# Patient Record
Sex: Female | Born: 1975
Health system: Southern US, Community
[De-identification: ages and names within clinical notes are randomized; demographics above are authoritative.]

## PROBLEM LIST (undated history)

## (undated) ENCOUNTER — Inpatient Hospital Stay (HOSPITAL_COMMUNITY): Payer: Medicaid Other

## (undated) DIAGNOSIS — O34219 Maternal care for unspecified type scar from previous cesarean delivery: Secondary | ICD-10-CM

## (undated) DIAGNOSIS — O163 Unspecified maternal hypertension, third trimester: Secondary | ICD-10-CM

## (undated) DIAGNOSIS — O139 Gestational [pregnancy-induced] hypertension without significant proteinuria, unspecified trimester: Secondary | ICD-10-CM

## (undated) DIAGNOSIS — E669 Obesity, unspecified: Secondary | ICD-10-CM

## (undated) DIAGNOSIS — O24419 Gestational diabetes mellitus in pregnancy, unspecified control: Secondary | ICD-10-CM

## (undated) DIAGNOSIS — E119 Type 2 diabetes mellitus without complications: Secondary | ICD-10-CM

## (undated) HISTORY — DX: Gestational diabetes mellitus in pregnancy, unspecified control: O24.419

## (undated) HISTORY — DX: Type 2 diabetes mellitus without complications: E11.9

---

## 2008-07-17 ENCOUNTER — Inpatient Hospital Stay (HOSPITAL_COMMUNITY): Admission: AD | Admit: 2008-07-17 | Discharge: 2008-07-17 | Payer: Self-pay | Admitting: Obstetrics & Gynecology

## 2008-08-01 ENCOUNTER — Encounter: Payer: Self-pay | Admitting: Family Medicine

## 2008-08-01 ENCOUNTER — Other Ambulatory Visit: Admission: RE | Admit: 2008-08-01 | Discharge: 2008-08-01 | Payer: Self-pay | Admitting: Family Medicine

## 2008-08-01 ENCOUNTER — Ambulatory Visit: Payer: Self-pay | Admitting: Family Medicine

## 2008-08-01 LAB — CONVERTED CEMR LAB
Antibody Screen: NEGATIVE
Basophils Absolute: 0 10*3/uL (ref 0.0–0.1)
Basophils Relative: 0 % (ref 0–1)
Eosinophils Relative: 1 % (ref 0–5)
GC Probe Amp, Genital: NEGATIVE
HCT: 39.7 % (ref 36.0–46.0)
Hemoglobin: 13.1 g/dL (ref 12.0–15.0)
MCHC: 33 g/dL (ref 30.0–36.0)
MCV: 86.5 fL (ref 78.0–100.0)
Monocytes Absolute: 0.8 10*3/uL (ref 0.1–1.0)
RBC: 4.59 M/uL (ref 3.87–5.11)
RDW: 14.5 % (ref 11.5–15.5)
Rh Type: POSITIVE
Sickle Cell Screen: NEGATIVE
WBC: 11.3 10*3/uL — ABNORMAL HIGH (ref 4.0–10.5)

## 2008-08-02 ENCOUNTER — Encounter: Payer: Self-pay | Admitting: Family Medicine

## 2008-08-05 ENCOUNTER — Telehealth: Payer: Self-pay | Admitting: Family Medicine

## 2008-08-07 ENCOUNTER — Encounter: Payer: Self-pay | Admitting: Family Medicine

## 2008-08-08 ENCOUNTER — Encounter: Payer: Self-pay | Admitting: Family Medicine

## 2008-08-08 ENCOUNTER — Telehealth: Payer: Self-pay | Admitting: Family Medicine

## 2008-08-12 ENCOUNTER — Encounter (INDEPENDENT_AMBULATORY_CARE_PROVIDER_SITE_OTHER): Payer: Self-pay | Admitting: *Deleted

## 2008-08-28 ENCOUNTER — Ambulatory Visit: Payer: Self-pay | Admitting: Family Medicine

## 2008-09-02 ENCOUNTER — Encounter: Payer: Self-pay | Admitting: Family Medicine

## 2008-09-04 ENCOUNTER — Ambulatory Visit: Payer: Self-pay | Admitting: Family Medicine

## 2008-09-12 ENCOUNTER — Encounter: Payer: Self-pay | Admitting: Family Medicine

## 2008-09-12 ENCOUNTER — Ambulatory Visit (HOSPITAL_COMMUNITY): Admission: RE | Admit: 2008-09-12 | Discharge: 2008-09-12 | Payer: Self-pay | Admitting: Family Medicine

## 2008-09-16 ENCOUNTER — Telehealth (INDEPENDENT_AMBULATORY_CARE_PROVIDER_SITE_OTHER): Payer: Self-pay | Admitting: *Deleted

## 2008-09-16 ENCOUNTER — Encounter: Payer: Self-pay | Admitting: Family Medicine

## 2008-09-26 ENCOUNTER — Ambulatory Visit: Payer: Self-pay | Admitting: Family Medicine

## 2008-09-26 ENCOUNTER — Encounter: Payer: Self-pay | Admitting: Family Medicine

## 2008-09-26 ENCOUNTER — Ambulatory Visit (HOSPITAL_COMMUNITY): Admission: RE | Admit: 2008-09-26 | Discharge: 2008-09-26 | Payer: Self-pay | Admitting: Family Medicine

## 2008-09-26 LAB — CONVERTED CEMR LAB
Bilirubin Urine: NEGATIVE
Epithelial cells, urine: >20 /lpf
Glucose, Urine, Semiquant: NEGATIVE
Protein, U semiquant: 30
WBC, UA: >20 cells/hpf
pH: 6.5

## 2008-09-27 ENCOUNTER — Encounter: Payer: Self-pay | Admitting: Family Medicine

## 2008-09-30 ENCOUNTER — Encounter: Payer: Self-pay | Admitting: Family Medicine

## 2008-10-01 ENCOUNTER — Ambulatory Visit: Payer: Self-pay | Admitting: Family Medicine

## 2008-10-17 ENCOUNTER — Ambulatory Visit: Payer: Self-pay | Admitting: Family Medicine

## 2008-10-17 ENCOUNTER — Encounter: Payer: Self-pay | Admitting: Family Medicine

## 2008-10-17 LAB — CONVERTED CEMR LAB
Ketones, urine, test strip: NEGATIVE
Nitrite: NEGATIVE
Urobilinogen, UA: 0.2
pH: 7

## 2008-11-01 ENCOUNTER — Ambulatory Visit: Payer: Self-pay | Admitting: Family Medicine

## 2008-11-15 ENCOUNTER — Ambulatory Visit: Payer: Self-pay | Admitting: Family Medicine

## 2008-11-15 ENCOUNTER — Encounter: Payer: Self-pay | Admitting: Family Medicine

## 2008-11-15 DIAGNOSIS — R7309 Other abnormal glucose: Secondary | ICD-10-CM | POA: Insufficient documentation

## 2008-11-15 LAB — CONVERTED CEMR LAB
MCV: 89.3 fL (ref 78.0–100.0)
Platelets: 146 10*3/uL — ABNORMAL LOW (ref 150–400)
RDW: 14.5 % (ref 11.5–15.5)
WBC: 10.1 10*3/uL (ref 4.0–10.5)

## 2008-11-19 ENCOUNTER — Encounter: Payer: Self-pay | Admitting: *Deleted

## 2008-11-25 ENCOUNTER — Ambulatory Visit: Payer: Self-pay | Admitting: Obstetrics & Gynecology

## 2008-11-25 ENCOUNTER — Encounter: Payer: Self-pay | Admitting: Family Medicine

## 2008-11-25 ENCOUNTER — Encounter: Admission: RE | Admit: 2008-11-25 | Discharge: 2008-12-02 | Payer: Self-pay | Admitting: Obstetrics & Gynecology

## 2008-11-25 LAB — CONVERTED CEMR LAB
HCT: 38.9 % (ref 36.0–46.0)
Platelets: 142 10*3/uL — ABNORMAL LOW (ref 150–400)
RDW: 14.1 % (ref 11.5–15.5)
WBC: 9.3 10*3/uL (ref 4.0–10.5)

## 2008-12-02 ENCOUNTER — Ambulatory Visit: Payer: Self-pay | Admitting: Obstetrics & Gynecology

## 2008-12-02 ENCOUNTER — Ambulatory Visit (HOSPITAL_COMMUNITY): Admission: RE | Admit: 2008-12-02 | Discharge: 2008-12-02 | Payer: Self-pay | Admitting: Family Medicine

## 2008-12-04 ENCOUNTER — Telehealth: Payer: Self-pay | Admitting: Family Medicine

## 2008-12-09 ENCOUNTER — Ambulatory Visit: Payer: Self-pay | Admitting: Obstetrics & Gynecology

## 2008-12-10 ENCOUNTER — Ambulatory Visit (HOSPITAL_COMMUNITY): Admission: RE | Admit: 2008-12-10 | Discharge: 2008-12-10 | Payer: Self-pay | Admitting: Family Medicine

## 2008-12-12 ENCOUNTER — Ambulatory Visit: Payer: Self-pay | Admitting: Obstetrics and Gynecology

## 2008-12-17 ENCOUNTER — Ambulatory Visit: Payer: Self-pay | Admitting: Obstetrics & Gynecology

## 2008-12-19 ENCOUNTER — Ambulatory Visit (HOSPITAL_COMMUNITY): Admission: RE | Admit: 2008-12-19 | Discharge: 2008-12-19 | Payer: Self-pay | Admitting: Obstetrics and Gynecology

## 2008-12-19 ENCOUNTER — Ambulatory Visit: Payer: Self-pay | Admitting: Obstetrics & Gynecology

## 2008-12-23 ENCOUNTER — Ambulatory Visit: Payer: Self-pay | Admitting: Obstetrics & Gynecology

## 2008-12-26 ENCOUNTER — Ambulatory Visit: Payer: Self-pay | Admitting: Family Medicine

## 2008-12-30 ENCOUNTER — Ambulatory Visit: Payer: Self-pay | Admitting: Obstetrics & Gynecology

## 2009-01-02 ENCOUNTER — Ambulatory Visit: Payer: Self-pay | Admitting: Obstetrics & Gynecology

## 2009-01-03 ENCOUNTER — Telehealth: Payer: Self-pay | Admitting: Family Medicine

## 2009-01-06 ENCOUNTER — Ambulatory Visit: Payer: Self-pay | Admitting: Obstetrics & Gynecology

## 2009-01-09 ENCOUNTER — Ambulatory Visit: Payer: Self-pay | Admitting: Obstetrics & Gynecology

## 2009-01-13 ENCOUNTER — Ambulatory Visit (HOSPITAL_COMMUNITY): Admission: RE | Admit: 2009-01-13 | Discharge: 2009-01-13 | Payer: Self-pay | Admitting: Family Medicine

## 2009-01-13 ENCOUNTER — Telehealth: Payer: Self-pay | Admitting: Family Medicine

## 2009-01-13 ENCOUNTER — Ambulatory Visit: Payer: Self-pay | Admitting: Obstetrics & Gynecology

## 2009-01-13 LAB — CONVERTED CEMR LAB: Chlamydia, Swab/Urine, PCR: NEGATIVE

## 2009-01-16 ENCOUNTER — Ambulatory Visit: Payer: Self-pay | Admitting: Obstetrics & Gynecology

## 2009-01-20 ENCOUNTER — Ambulatory Visit: Payer: Self-pay | Admitting: Family Medicine

## 2009-01-23 ENCOUNTER — Ambulatory Visit: Payer: Self-pay | Admitting: Obstetrics & Gynecology

## 2009-01-27 ENCOUNTER — Ambulatory Visit (HOSPITAL_COMMUNITY): Admission: RE | Admit: 2009-01-27 | Discharge: 2009-01-27 | Payer: Self-pay | Admitting: Family Medicine

## 2009-01-27 ENCOUNTER — Ambulatory Visit: Payer: Self-pay | Admitting: Obstetrics & Gynecology

## 2009-01-30 ENCOUNTER — Ambulatory Visit: Payer: Self-pay | Admitting: Obstetrics & Gynecology

## 2009-01-31 ENCOUNTER — Ambulatory Visit: Payer: Self-pay | Admitting: Obstetrics & Gynecology

## 2009-02-01 ENCOUNTER — Ambulatory Visit: Payer: Self-pay | Admitting: Family Medicine

## 2009-02-01 ENCOUNTER — Inpatient Hospital Stay (HOSPITAL_COMMUNITY): Admission: AD | Admit: 2009-02-01 | Discharge: 2009-02-03 | Payer: Self-pay | Admitting: Obstetrics & Gynecology

## 2009-02-03 ENCOUNTER — Telehealth: Payer: Self-pay | Admitting: Family Medicine

## 2009-03-10 ENCOUNTER — Ambulatory Visit: Payer: Self-pay | Admitting: Family Medicine

## 2009-03-10 ENCOUNTER — Encounter: Payer: Self-pay | Admitting: *Deleted

## 2009-03-10 DIAGNOSIS — O24919 Unspecified diabetes mellitus in pregnancy, unspecified trimester: Secondary | ICD-10-CM | POA: Insufficient documentation

## 2009-04-01 ENCOUNTER — Encounter: Payer: Self-pay | Admitting: Family Medicine

## 2009-05-09 ENCOUNTER — Emergency Department (HOSPITAL_COMMUNITY): Admission: EM | Admit: 2009-05-09 | Discharge: 2009-05-09 | Payer: Self-pay | Admitting: Emergency Medicine

## 2010-05-03 ENCOUNTER — Encounter: Payer: Self-pay | Admitting: Family Medicine

## 2010-06-28 LAB — POCT I-STAT, CHEM 8
Chloride: 103 mEq/L (ref 96–112)
Glucose, Bld: 219 mg/dL — ABNORMAL HIGH (ref 70–99)
Hemoglobin: 13.9 g/dL (ref 12.0–15.0)

## 2010-06-28 LAB — URINALYSIS, ROUTINE W REFLEX MICROSCOPIC
Ketones, ur: 15 mg/dL — AB
Nitrite: NEGATIVE
Protein, ur: 100 mg/dL — AB
Urobilinogen, UA: 1 mg/dL (ref 0.0–1.0)

## 2010-06-28 LAB — URINE CULTURE

## 2010-06-28 LAB — DIFFERENTIAL
Basophils Absolute: 0 10*3/uL (ref 0.0–0.1)
Basophils Relative: 0 % (ref 0–1)
Eosinophils Absolute: 0 10*3/uL (ref 0.0–0.7)
Monocytes Absolute: 1.2 10*3/uL — ABNORMAL HIGH (ref 0.1–1.0)
Neutro Abs: 7.8 10*3/uL — ABNORMAL HIGH (ref 1.7–7.7)

## 2010-06-28 LAB — URINE MICROSCOPIC-ADD ON

## 2010-06-28 LAB — CBC
HCT: 38.4 % (ref 36.0–46.0)
Platelets: 165 10*3/uL (ref 150–400)
RBC: 4.37 MIL/uL (ref 3.87–5.11)

## 2010-07-15 LAB — GLUCOSE, CAPILLARY: Glucose-Capillary: 198 mg/dL — ABNORMAL HIGH (ref 70–99)

## 2010-07-16 LAB — GLUCOSE, CAPILLARY
Glucose-Capillary: 78 mg/dL (ref 70–99)
Glucose-Capillary: 82 mg/dL (ref 70–99)
Glucose-Capillary: 89 mg/dL (ref 70–99)

## 2010-07-16 LAB — POCT URINALYSIS DIP (DEVICE)
Bilirubin Urine: NEGATIVE
Bilirubin Urine: NEGATIVE
Glucose, UA: NEGATIVE mg/dL
Glucose, UA: NEGATIVE mg/dL
Hgb urine dipstick: NEGATIVE
Ketones, ur: NEGATIVE mg/dL
Nitrite: NEGATIVE
Protein, ur: NEGATIVE mg/dL
Specific Gravity, Urine: 1.01 (ref 1.005–1.030)
Specific Gravity, Urine: 1.01 (ref 1.005–1.030)
Specific Gravity, Urine: 1.015 (ref 1.005–1.030)
Urobilinogen, UA: 0.2 mg/dL (ref 0.0–1.0)
pH: 6 (ref 5.0–8.0)

## 2010-07-16 LAB — CBC
HCT: 40.4 % (ref 36.0–46.0)
Hemoglobin: 11.3 g/dL — ABNORMAL LOW (ref 12.0–15.0)
MCHC: 33.7 g/dL (ref 30.0–36.0)
MCHC: 33.8 g/dL (ref 30.0–36.0)
MCV: 88.9 fL (ref 78.0–100.0)
MCV: 89.1 fL (ref 78.0–100.0)
MCV: 89.2 fL (ref 78.0–100.0)
Platelets: 107 10*3/uL — ABNORMAL LOW (ref 150–400)
Platelets: 108 10*3/uL — ABNORMAL LOW (ref 150–400)
RBC: 3.74 MIL/uL — ABNORMAL LOW (ref 3.87–5.11)
RBC: 4.54 MIL/uL (ref 3.87–5.11)
RDW: 15.3 % (ref 11.5–15.5)
RDW: 15.5 % (ref 11.5–15.5)
WBC: 8.3 10*3/uL (ref 4.0–10.5)
WBC: 9.6 10*3/uL (ref 4.0–10.5)

## 2010-07-16 LAB — COMPREHENSIVE METABOLIC PANEL
AST: 19 U/L (ref 0–37)
Albumin: 3.1 g/dL — ABNORMAL LOW (ref 3.5–5.2)
Alkaline Phosphatase: 235 U/L — ABNORMAL HIGH (ref 39–117)
BUN: 8 mg/dL (ref 6–23)
CO2: 19 mEq/L (ref 19–32)
Chloride: 106 mEq/L (ref 96–112)
Creatinine, Ser: 0.6 mg/dL (ref 0.4–1.2)
GFR calc non Af Amer: >60 mL/min (ref >60)
Potassium: 3.7 mEq/L (ref 3.5–5.1)
Total Bilirubin: 0.7 mg/dL (ref 0.3–1.2)

## 2010-07-16 LAB — RPR: RPR Ser Ql: NONREACTIVE

## 2010-07-17 LAB — POCT URINALYSIS DIP (DEVICE)
Bilirubin Urine: NEGATIVE
Bilirubin Urine: NEGATIVE
Bilirubin Urine: NEGATIVE
Bilirubin Urine: NEGATIVE
Bilirubin Urine: NEGATIVE
Glucose, UA: NEGATIVE mg/dL
Glucose, UA: NEGATIVE mg/dL
Glucose, UA: NEGATIVE mg/dL
Glucose, UA: NEGATIVE mg/dL
Hgb urine dipstick: NEGATIVE
Hgb urine dipstick: NEGATIVE
Hgb urine dipstick: NEGATIVE
Hgb urine dipstick: NEGATIVE
Ketones, ur: 40 mg/dL — AB
Ketones, ur: NEGATIVE mg/dL
Ketones, ur: 15 mg/dL — AB
Ketones, ur: 40 mg/dL — AB
Ketones, ur: NEGATIVE mg/dL
Nitrite: NEGATIVE
Nitrite: NEGATIVE
Nitrite: NEGATIVE
Nitrite: NEGATIVE
Nitrite: NEGATIVE
Protein, ur: NEGATIVE mg/dL
Protein, ur: NEGATIVE mg/dL
Protein, ur: NEGATIVE mg/dL
Protein, ur: NEGATIVE mg/dL
Protein, ur: NEGATIVE mg/dL
Specific Gravity, Urine: 1.01 (ref 1.005–1.030)
Specific Gravity, Urine: 1.01 (ref 1.005–1.030)
Specific Gravity, Urine: 1.01 (ref 1.005–1.030)
Specific Gravity, Urine: 1.015 (ref 1.005–1.030)
Urobilinogen, UA: 0.2 mg/dL (ref 0.0–1.0)
Urobilinogen, UA: 0.2 mg/dL (ref 0.0–1.0)
Urobilinogen, UA: 0.2 mg/dL (ref 0.0–1.0)
Urobilinogen, UA: 0.2 mg/dL (ref 0.0–1.0)
pH: 6 (ref 5.0–8.0)
pH: 6.5 (ref 5.0–8.0)
pH: 6.5 (ref 5.0–8.0)
pH: 6.5 (ref 5.0–8.0)
pH: 7 (ref 5.0–8.0)

## 2010-07-18 LAB — POCT URINALYSIS DIP (DEVICE)
Bilirubin Urine: NEGATIVE
Bilirubin Urine: NEGATIVE
Bilirubin Urine: NEGATIVE
Glucose, UA: NEGATIVE mg/dL
Glucose, UA: NEGATIVE mg/dL
Glucose, UA: NEGATIVE mg/dL
Hgb urine dipstick: NEGATIVE
Ketones, ur: 80 mg/dL — AB
Ketones, ur: 80 mg/dL — AB
Nitrite: NEGATIVE
Protein, ur: NEGATIVE mg/dL
Specific Gravity, Urine: 1.01 (ref 1.005–1.030)
Specific Gravity, Urine: <=1.005 (ref 1.005–1.030)
Urobilinogen, UA: 0.2 mg/dL (ref 0.0–1.0)

## 2010-07-18 LAB — GLUCOSE, CAPILLARY
Glucose-Capillary: 333 mg/dL — ABNORMAL HIGH (ref 70–99)
Glucose-Capillary: 370 mg/dL — ABNORMAL HIGH (ref 70–99)

## 2010-07-22 LAB — WET PREP, GENITAL
Clue Cells Wet Prep HPF POC: NONE SEEN
Trich, Wet Prep: NONE SEEN

## 2010-07-22 LAB — ABO/RH: ABO/RH(D): O POS

## 2010-12-15 IMAGING — CR DG CHEST 2V
2 series · 2 of 2 positions shown · non-contrast
Comparison: None

CLINICAL DATA: Fever

CHEST - 2 VIEW

[w chest pa]
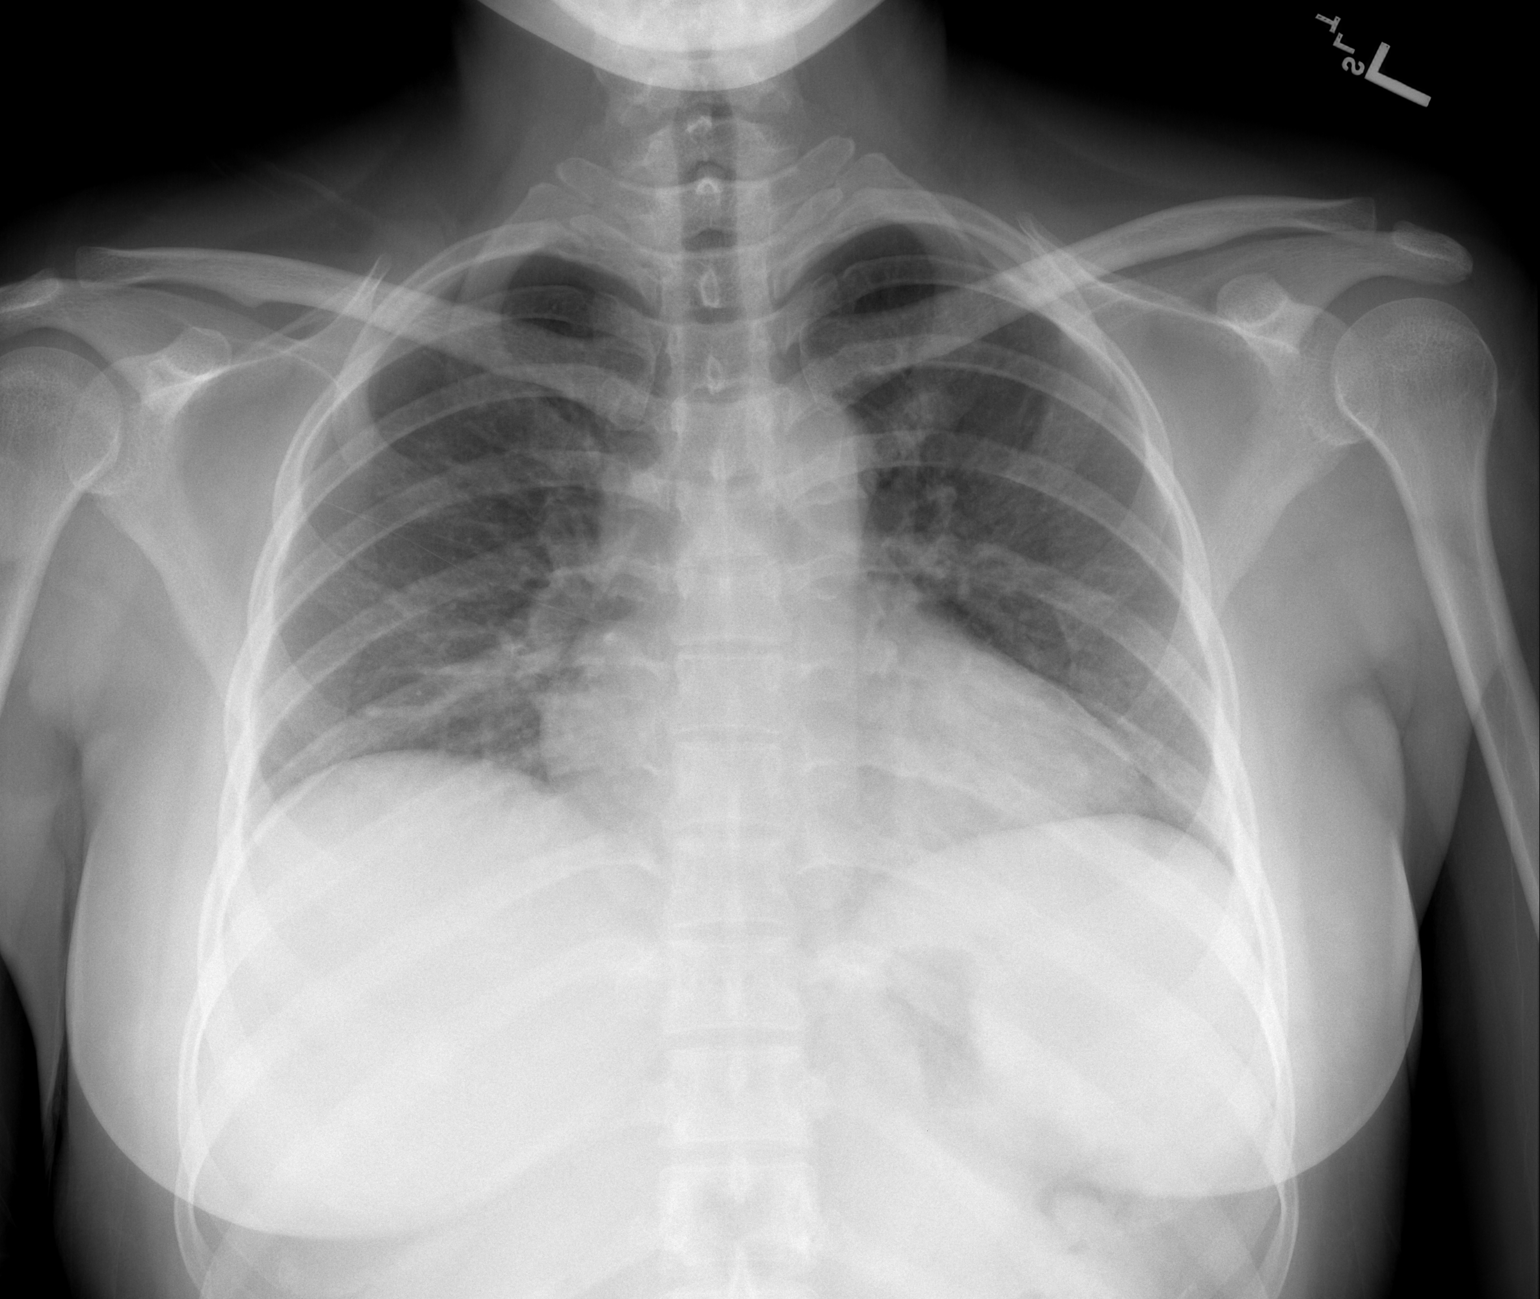

[w chest lat]
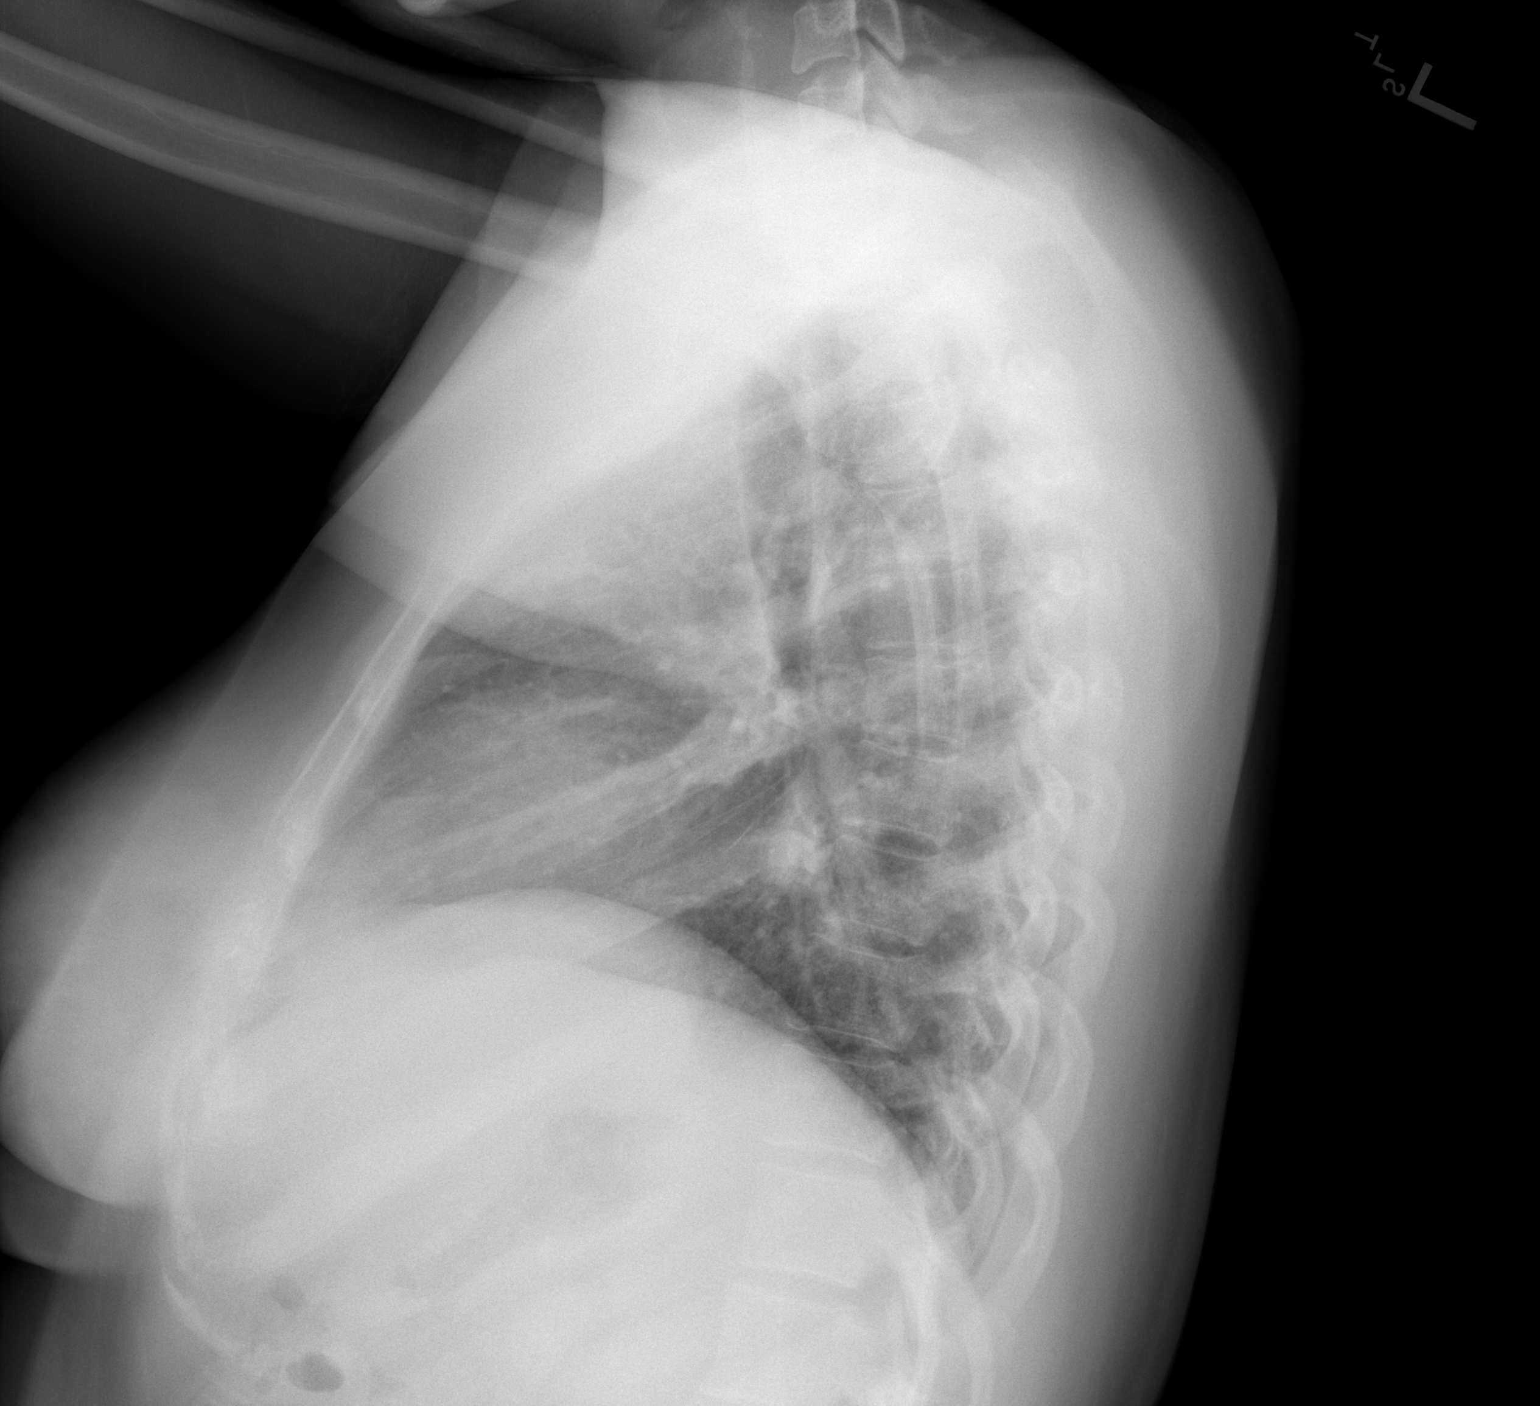

[2 of 2 positions shown; findings below may reference images not displayed]

FINDINGS: Mild cardiac enlargement, likely accentuated by low lung volumes.
Normal mediastinal contours and pulmonary vascularity.
Bibasilar atelectasis.
No gross infiltrate or effusion.
Bones unremarkable.
No pneumothorax.
IMPRESSION: Bibasilar atelectasis.

## 2011-07-13 ENCOUNTER — Ambulatory Visit: Payer: Self-pay | Admitting: Physician Assistant

## 2011-07-13 VITALS — BP 154/98 | HR 94 | Temp 98.1°F | Resp 16 | Ht 61.0 in | Wt 148.0 lb

## 2011-07-13 DIAGNOSIS — R030 Elevated blood-pressure reading, without diagnosis of hypertension: Secondary | ICD-10-CM

## 2011-07-13 DIAGNOSIS — E119 Type 2 diabetes mellitus without complications: Secondary | ICD-10-CM

## 2011-07-13 DIAGNOSIS — E1129 Type 2 diabetes mellitus with other diabetic kidney complication: Secondary | ICD-10-CM | POA: Insufficient documentation

## 2011-07-13 DIAGNOSIS — R059 Cough, unspecified: Secondary | ICD-10-CM

## 2011-07-13 DIAGNOSIS — R05 Cough: Secondary | ICD-10-CM

## 2011-07-13 DIAGNOSIS — R21 Rash and other nonspecific skin eruption: Secondary | ICD-10-CM

## 2011-07-13 DIAGNOSIS — IMO0001 Reserved for inherently not codable concepts without codable children: Secondary | ICD-10-CM

## 2011-07-13 HISTORY — DX: Type 2 diabetes mellitus without complications: E11.9

## 2011-07-13 LAB — BASIC METABOLIC PANEL
CO2: 24 mEq/L (ref 19–32)
Calcium: 9.5 mg/dL (ref 8.4–10.5)
Potassium: 3.9 mEq/L (ref 3.5–5.3)
Sodium: 139 mEq/L (ref 135–145)

## 2011-07-13 LAB — POCT GLYCOSYLATED HEMOGLOBIN (HGB A1C): Hemoglobin A1C: 7.9

## 2011-07-13 LAB — POCT SKIN KOH: Skin KOH, POC: NEGATIVE

## 2011-07-13 MED ORDER — METFORMIN HCL 1000 MG PO TABS: 1000.0000 mg | ORAL_TABLET | Freq: Two times a day (BID) | ORAL | Status: DC

## 2011-07-13 MED ORDER — CETIRIZINE HCL 10 MG PO TABS: 10.0000 mg | ORAL_TABLET | Freq: Every day | ORAL | Status: DC

## 2011-07-13 MED ORDER — BENZONATATE 100 MG PO CAPS: ORAL_CAPSULE | ORAL | Status: AC

## 2011-07-13 MED ORDER — TRIAMCINOLONE ACETONIDE 0.5 % EX OINT: TOPICAL_OINTMENT | Freq: Two times a day (BID) | CUTANEOUS | Status: AC

## 2011-07-13 NOTE — Progress Notes (Signed)
  Subjective:    Patient ID: [name], female    DOB: 05/13/1975, 36 y.o.   MRN: [id number]  HPI Pt presents to clinic with 3 complaints 1- needs DM recheck.  Does not check her sugars.  Takes her meds every day.  Feels good.  Trying to become pregnant again.  No h/o BP problems.  No problems with her feet. 2- has had a cough for the last couple of weeks with a tickle in the back of her throat. 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  The rash has been there are a while - she has used some OTC meds (unsure of name)   Review of Systems  Constitutional: Negative for fever and chills.  HENT: Positive for congestion and postnasal drip. Negative for rhinorrhea.   Respiratory: Positive for cough (dry tickle like cough).   Gastrointestinal: Negative for nausea, vomiting and diarrhea.  Skin: Positive for rash.       Objective:   Physical Exam  Constitutional: She is oriented to person, place, and time. She appears well-developed and well-nourished.  HENT:  Head: Normocephalic and atraumatic.  Right Ear: Hearing, tympanic membrane, external ear and ear canal normal.  Left Ear: Hearing, tympanic membrane, external ear and ear canal normal.  Nose: Mucosal edema (pale) present.  Mouth/Throat: Uvula is midline, oropharynx is clear and moist and mucous membranes are normal.  Eyes: Conjunctivae are normal.  Neck: Neck supple.  Cardiovascular: Normal rate, regular rhythm and normal heart sounds.   Pulmonary/Chest: Effort normal and breath sounds normal.  Lymphadenopathy:    She has no cervical adenopathy.  Neurological: She is alert and oriented to person, place, and time.  Skin: Skin is warm and dry. Rash noted.     Psychiatric: She has a normal mood and affect. Her behavior is normal. Judgment and thought content normal.          Assessment & Plan:   1. DM type 2 (diabetes mellitus, type 2)  POCT glycosylated  hemoglobin (Hb A1C), Basic metabolic panel, metFORMIN (GLUCOPHAGE) 1000 MG tablet  2. Rash  POCT Skin KOH, triamcinolone ointment (KENALOG) 0.5 %  3. Cough  benzonatate (TESSALON) 100 MG capsule, cetirizine (ZYRTEC) 10 MG tablet   Pt to monitor her BP - but I do not want to do any meds because of her interest in becoming pregnant.  D/w pt that her HgbA1C is slightly higher than we would like but once again due to interest in becoming pregnant will not start any other medications and stressed the importance of good healthy lifestyle.  Pt to start to more carefully monitor her glucose esp with her wish to become pregnant.  Pt to be aware that latex gloves can make her skin rash worse - but it is important to continue to wear gloves to decrease risk of other infections.

## 2011-10-08 ENCOUNTER — Other Ambulatory Visit: Payer: Self-pay | Admitting: Physician Assistant

## 2011-11-16 ENCOUNTER — Ambulatory Visit (INDEPENDENT_AMBULATORY_CARE_PROVIDER_SITE_OTHER): Payer: Self-pay | Admitting: Physician Assistant

## 2011-11-16 VITALS — BP 120/78 | HR 87 | Temp 98.2°F | Resp 16 | Ht 61.25 in | Wt 147.4 lb

## 2011-11-16 DIAGNOSIS — E119 Type 2 diabetes mellitus without complications: Secondary | ICD-10-CM

## 2011-11-16 LAB — POCT GLYCOSYLATED HEMOGLOBIN (HGB A1C): Hemoglobin A1C: 9.1

## 2011-11-16 MED ORDER — METFORMIN HCL 1000 MG PO TABS: 1000.0000 mg | ORAL_TABLET | Freq: Two times a day (BID) | ORAL | Status: DC

## 2011-11-16 NOTE — Progress Notes (Signed)
  Subjective:    Patient ID: [name], female    DOB: 30-Aug-1975, 36 y.o.   MRN: [id number]  HPI 36 year old female presents for diabetes recheck.  Last HgA1c 3 months ago at 7.9. No changes at that time because she is trying to get pregnant.  Admits that she has not made any changes to her diet or exercised at all.  She is here with her husband who appears to be frustrated that she has not made any of these changes.      Review of Systems  All other systems reviewed and are negative.       Objective:   Physical Exam  Constitutional: She is oriented to person, place, and time. She appears well-developed and well-nourished.  HENT:  Head: Normocephalic and atraumatic.  Right Ear: External ear normal.  Left Ear: External ear normal.  Eyes: Conjunctivae are normal.  Neck: Normal range of motion.  Cardiovascular: Normal rate, regular rhythm and normal heart sounds.   Pulmonary/Chest: Effort normal and breath sounds normal.  Neurological: She is alert and oriented to person, place, and time.  Psychiatric: She has a normal mood and affect. Her behavior is normal. Judgment and thought content normal.          Assessment & Plan:   1. Diabetes mellitus  POCT glycosylated hemoglobin (Hb A1C)   Counseling and education provided.  Encouraged her to get 20-30 minutes of aerobic exercise daily.   Follow up in 3 months. If HgA1c still elevated, need to consider additional treatment.

## 2012-04-12 NOTE — L&D Delivery Note (Signed)
Infant delivered by Dr. Marice Potter (see separate operative note). Upon completion of cesarean section, perineum was inspected by me and patient was noted to have a 4 degree laceration. Using 2.0 Vicryl and multiple layer closure, the 4th degree laceration was repaired with excellent hemostasis and restoration of the anatomy. Rectal exam was performed. Fundus firm. Vault empty.

## 2012-09-05 ENCOUNTER — Ambulatory Visit: Payer: Self-pay | Admitting: Family Medicine

## 2012-09-05 VITALS — BP 138/84 | HR 103 | Temp 97.8°F | Resp 18 | Ht 61.0 in | Wt 146.0 lb

## 2012-09-05 DIAGNOSIS — N912 Amenorrhea, unspecified: Secondary | ICD-10-CM

## 2012-09-05 DIAGNOSIS — Z331 Pregnant state, incidental: Secondary | ICD-10-CM

## 2012-09-05 DIAGNOSIS — IMO0001 Reserved for inherently not codable concepts without codable children: Secondary | ICD-10-CM

## 2012-09-05 DIAGNOSIS — E119 Type 2 diabetes mellitus without complications: Secondary | ICD-10-CM

## 2012-09-05 DIAGNOSIS — Z3490 Encounter for supervision of normal pregnancy, unspecified, unspecified trimester: Secondary | ICD-10-CM

## 2012-09-05 LAB — GLUCOSE, POCT (MANUAL RESULT ENTRY): POC Glucose: 172 mg/dl — AB (ref 70–99)

## 2012-09-05 LAB — COMPREHENSIVE METABOLIC PANEL
ALT: 38 U/L — ABNORMAL HIGH (ref 0–35)
AST: 27 U/L (ref 0–37)
Calcium: 8.9 mg/dL (ref 8.4–10.5)
Chloride: 102 mEq/L (ref 96–112)
Creat: 0.55 mg/dL (ref 0.50–1.10)
Potassium: 3.9 mEq/L (ref 3.5–5.3)
Sodium: 135 mEq/L (ref 135–145)

## 2012-09-05 LAB — POCT GLYCOSYLATED HEMOGLOBIN (HGB A1C): Hemoglobin A1C: 9.5

## 2012-09-05 MED ORDER — GLYBURIDE 5 MG PO TABS: 5.0000 mg | ORAL_TABLET | Freq: Every day | ORAL | Status: DC

## 2012-09-05 MED ORDER — PRENATAL VITAMINS (DIS) PO TABS: 1.0000 | ORAL_TABLET | Freq: Every day | ORAL | Status: DC

## 2012-09-05 NOTE — Patient Instructions (Addendum)
Due Apr 26, 2013---6 weeks and 5 days.  Call [phone] - high risk clinic - tell them early pregnancy uncontrolled diabetes - on glyburide for control

## 2012-09-05 NOTE — Progress Notes (Signed)
   [address], [place] [address]   Phone [phone]  Subjective:    Patient ID: [name], female    DOB: 1975/12/07, 37 y.o.   MRN: [id number]  HPI  Pt presents to clinic with the need for a pregnancy test.  She took a home test and it was positive.  She has a 3.5 y/o son at home.  She feels really tired but other than that she is good.  She ran out of her diabetes medications about 3 days ago.  During her last pregnancy, she had gestational diabetes that ended up not resolving with delivery.  She has been on Glucophage since but never had good control.  She has not had good f/u for her diabetes.  Review of Systems  Constitutional: Positive for fatigue.  Gastrointestinal: Negative for nausea.  Genitourinary: Negative for vaginal bleeding.       Objective:   Physical Exam  Vitals reviewed. Constitutional: She is oriented to person, place, and time. She appears well-developed and well-nourished.  Cardiovascular: Normal rate, regular rhythm and normal heart sounds.   No murmur heard. Pulmonary/Chest: Effort normal and breath sounds normal.  Neurological: She is alert and oriented to person, place, and time.  Skin: Skin is warm and dry.  Psychiatric: She has a normal mood and affect. Her behavior is normal. Judgment and thought content normal.    Results for orders placed in visit on 09/05/12  POCT URINE PREGNANCY      Result Value Range   Preg Test, Ur Positive    GLUCOSE, POCT (MANUAL RESULT ENTRY)      Result Value Range   POC Glucose 172 (*) 70 - 99 mg/dl  POCT GLYCOSYLATED HEMOGLOBIN (HGB A1C)      Result Value Range   Hemoglobin A1C 9.5           Assessment & Plan:  Amenorrhea - Plan: POCT urine pregnancy  DM type 2 (diabetes mellitus, type 2) - Plan: POCT glucose (manual entry), POCT glycosylated hemoglobin (Hb A1C), Comprehensive metabolic panel, glyBURIDE (DIABETA) 5 MG tablet  Pregnancy - Plan: glyBURIDE (DIABETA) 5 MG tablet, Prenatal Vitamins (DIS)  TABS  Due to uncontrolled diabetes on metformin 1000 mg bid I suspect that pt will need insulin during the pregnancy.  We discussed the risk on uncontrolled glucose during pregnancy and she is going to work hard on her diet and get test strips to monitor her glucose.  I called and discussed the patient with [place] at [facility] and due to partially language barrier and multiple types of possible insulin we will start her on Glyburide and have her get an appt with the [facility] to  Look for a IUP and then have them start insulin.  She agrees with this plan.  If her glucose gets below 140 fasting she will contact me if she has not yet gotten an appt but I do not expect this to happen with a A1C of 9.5  D/w Dr Conley Rolls.  Benny Lennert PA-C 09/05/2012 3:07 PM

## 2012-09-07 ENCOUNTER — Ambulatory Visit: Payer: Self-pay | Admitting: Physician Assistant

## 2012-09-07 VITALS — BP 142/88 | HR 90 | Temp 98.5°F | Resp 16 | Ht 61.0 in | Wt 147.0 lb

## 2012-09-07 DIAGNOSIS — R748 Abnormal levels of other serum enzymes: Secondary | ICD-10-CM

## 2012-09-07 NOTE — Progress Notes (Signed)
   [address] [place] [address]   Phone [phone]  Subjective:    Patient ID: [name], female    DOB: 1975-04-19, 37 y.o.   MRN: [id number]  HPI Pt presents to clinic with concerns regarding her lab results.  She got a phone call about her liver enzymes and she was worried.  She is feeling good, she is tired.  She just got her DM meds.  Her glucose is running 150s in the am.    Review of Systems     Objective:   Physical Exam  Vitals reviewed. Constitutional: She is oriented to person, place, and time. She appears well-developed and well-nourished.  HENT:  Head: Normocephalic and atraumatic.  Right Ear: External ear normal.  Left Ear: External ear normal.  Eyes: Conjunctivae are normal.  Pulmonary/Chest: Effort normal.  Neurological: She is alert and oriented to person, place, and time.  Psychiatric: She has a normal mood and affect. Her behavior is normal. Judgment and thought content normal.          Assessment & Plan:  Elevated liver enzymes - d/w pt that her LFT were only mildly elevated and not something that we would get alarmed about but should be checked in the next 2-4 wks.  If she develops abd pain on the R due to pregnancy and increased in gall bladder disease she will RTC.  I answered her questions.  Benny Lennert PA-C 09/07/2012 4:55 PM

## 2012-10-02 ENCOUNTER — Other Ambulatory Visit: Payer: Self-pay | Admitting: Physician Assistant

## 2012-10-03 NOTE — Telephone Encounter (Signed)
Spoke w/pt to see whether she has been to high risk clinic at [facility] yet for them to manage her DM? Pt stated that she is waiting for her Medicaid card to come through before she can go. She would like RFs until she can see them at hosp. Maralyn Sago, is this OK to RF?

## 2012-10-03 NOTE — Telephone Encounter (Signed)
Sent 1 month supply to pharmacy, but she really needs to be seen at high risk clinic.

## 2012-10-05 NOTE — Telephone Encounter (Signed)
LMOM for pt that we have RFd med but provider strongly recommends that she get in to be seen at high risk clinic.

## 2012-11-07 ENCOUNTER — Other Ambulatory Visit: Payer: Self-pay | Admitting: Physician Assistant

## 2012-11-13 ENCOUNTER — Ambulatory Visit (INDEPENDENT_AMBULATORY_CARE_PROVIDER_SITE_OTHER): Payer: Medicaid Other | Admitting: Obstetrics & Gynecology

## 2012-11-13 ENCOUNTER — Other Ambulatory Visit: Payer: Self-pay

## 2012-11-13 ENCOUNTER — Encounter: Payer: Self-pay | Admitting: Obstetrics & Gynecology

## 2012-11-13 ENCOUNTER — Encounter: Payer: Medicaid Other | Attending: Obstetrics & Gynecology | Admitting: *Deleted

## 2012-11-13 ENCOUNTER — Other Ambulatory Visit (HOSPITAL_COMMUNITY)
Admission: RE | Admit: 2012-11-13 | Discharge: 2012-11-13 | Disposition: A | Discharge status: Home / Self Care | Payer: Medicaid Other | Source: Ambulatory Visit | Attending: Obstetrics & Gynecology | Admitting: Obstetrics & Gynecology

## 2012-11-13 VITALS — BP 132/84 | Wt 158.5 lb

## 2012-11-13 DIAGNOSIS — Z01419 Encounter for gynecological examination (general) (routine) without abnormal findings: Secondary | ICD-10-CM | POA: Insufficient documentation

## 2012-11-13 DIAGNOSIS — Z113 Encounter for screening for infections with a predominantly sexual mode of transmission: Secondary | ICD-10-CM | POA: Insufficient documentation

## 2012-11-13 DIAGNOSIS — O99810 Abnormal glucose complicating pregnancy: Secondary | ICD-10-CM

## 2012-11-13 DIAGNOSIS — O0992 Supervision of high risk pregnancy, unspecified, second trimester: Secondary | ICD-10-CM

## 2012-11-13 DIAGNOSIS — Z713 Dietary counseling and surveillance: Secondary | ICD-10-CM | POA: Insufficient documentation

## 2012-11-13 DIAGNOSIS — Z1151 Encounter for screening for human papillomavirus (HPV): Secondary | ICD-10-CM | POA: Insufficient documentation

## 2012-11-13 LAB — POCT URINALYSIS DIP (DEVICE)
Bilirubin Urine: NEGATIVE
Hgb urine dipstick: NEGATIVE
Nitrite: NEGATIVE
Protein, ur: NEGATIVE mg/dL
pH: 7 (ref 5.0–8.0)

## 2012-11-13 MED ORDER — ACCU-CHEK FASTCLIX LANCETS MISC: 1.0000 | Freq: Four times a day (QID) | Status: DC

## 2012-11-13 MED ORDER — GLUCOSE BLOOD VI STRP: ORAL_STRIP | Status: DC

## 2012-11-13 NOTE — Patient Instructions (Signed)
Type 1 or Type 2 Diabetes Mellitus During Pregnancy   Diabetes mellitus, often simply referred to as diabetes, is a long-term (chronic) disease. Type 1 diabetes occurs when the islet cells in the pancreas that make insulin (a hormone) are destroyed and can no longer make insulin. Type 2 diabetes occurs when the pancreas does not make enough insulin, the cells are less responsive to the insulin that is made (insulin resistance), or both. Insulin is needed to move sugars from food into the tissue cells. The tissue cells use the sugars for energy. The lack of insulin or the lack of normal response to insulin causes excess sugars to build up in the blood instead of going into the tissue cells. As a result, high blood sugar (hyperglycemia) develops.   If blood glucose levels are kept in the normal range both before and during pregnancy, women can have a healthy pregnancy. If your blood glucose levels are not well controlled, there may be risks to you, your unborn baby (fetus), your labor and delivery, or your newborn baby.   RISK FACTORS   · You are predisposed to developing type 1 diabetes if someone in your family has diabetes and you are exposed to certain environmental triggers.  · You have an increased chance of developing type 2 diabetes if you have a family history of diabetes and also have one or more of the following risk factors:  · Being overweight.  · Having an inactive lifestyle.  · Having a history of consistently eating high-calorie foods.   SYMPTOMS  The symptoms of diabetes include:  · Increased thirst (polydipsia).  · Increased urination (polyuria).  · Increased urination during the night (nocturia).  · Weight loss. This weight loss may be rapid.  · Frequent, recurring infections.  · Tiredness (fatigue).  · Weakness.  · Vision changes, such as blurred vision.  · Fruity smell to your breath.  · Abdominal pain.  · Nausea or vomiting.  DIAGNOSIS   Diabetes is diagnosed when blood glucose levels are  increased. Your blood glucose level may be checked by one or more of the following blood tests:  · A fasting blood glucose test.  You will not be allowed to eat for at least 8 hours before a blood sample is taken.  · A random blood glucose test. Your blood glucose is checked at any time of the day regardless of when you ate.  · A hemoglobin A1c blood glucose test. A hemoglobin A1c test provides information about blood glucose control over the previous 3 months.  · An oral glucose tolerance test (OGTT). Your blood glucose is measured after you have not eaten (fasted) for 1 3 hours and then after you drink a glucose-containing beverage. An OGTT is usually performed during weeks 24 28 of your pregnancy.  TREATMENT   · You will need to take diabetes medicine or insulin daily to keep blood glucose levels in the desired range.  · You will need to match insulin dosing with exercise and healthy food choices.   The treatment goal is to maintain the before-meal (preprandial), bedtime, and overnight blood glucose level at 60 99 mg/dL during pregnancy. The treatment goal is to further maintain the peak after-meal blood sugar (postprandial glucose) level at 100 140 mg/dL.    HOME CARE INSTRUCTIONS   · Have your hemoglobin A1c level checked twice a year.  · Perform daily blood glucose monitoring as directed by your caregiver. It is common to perform frequent blood glucose monitoring.    · Monitor urine ketones when   you are ill and as directed by your caregiver.  · Take your diabetes medicine and insulin as directed by your caregiver to maintain your blood glucose level in the desired range.  · Never run out of diabetes medicine or insulin. It is needed every day.  · Adjust insulin based on your intake of carbohydrates. Carbohydrates can raise blood glucose levels but need to be included in your diet. Carbohydrates provide vitamins, minerals, and fiber, which are an essential part of a healthy diet. Carbohydrates are found in  fruits, vegetables, whole grains, dairy products, legumes, and foods containing added sugars.  · Eat healthy foods. Alternate 3 meals with 3 snacks.  · Maintain a healthy weight gain. The usual total expected weight gain varies according to your prepregnancy body mass index (BMI).   · Carry a medical alert card or wear medical alert jewelry.  · Carry a 15 gram carbohydrate snack with you at all times to treat low blood sugar (hypoglycemia). Some examples of 15 gram carbohydrate snacks include:  · Glucose tablets, 3 or 4.  · Glucose gel, 15 gram tube.  · Raisins, 2 tablespoons (24 grams).  · Jelly beans, 6.  · Animal crackers, 8.  · Fruit juice, regular soda, or low fat milk, 4 ounces (120 mL).  · Gummy treats, 9.  · Recognize hypoglycemia. Hypoglycemia during pregnancy occurs with blood glucose levels of 60 mg/dL and below. The risk for hypoglycemia increases when fasting or skipping meals, during or after intense exercise, and during sleep. Hypoglycemia symptoms can include:  · Tremors or shakes.  · Decreased ability to concentrate.  · Sweating.  · Increased heart rate.  · Headache.  · Dry mouth.  · Hunger.  · Irritability.  · Anxiety.  · Restless sleep.  · Altered speech or coordination.  · Confusion.  · Treat hypoglycemia promptly. If you are alert and able to safely swallow, follow the 15:15 rule:  · Take 15 20 grams of rapid-acting glucose or carbohydrate. Rapid-acting options include glucose gel, glucose tablets, or 4 ounces (120 mL) of fruit juice, regular soda, or low-fat milk.  · Check your blood glucose level 15 minutes after taking the glucose.  · Take 15 20 grams more of glucose if the repeat blood glucose level is still 70 mg/dL or below.  · Eat a meal or snack within 1 hour once blood glucose levels return to normal.  · Engage in at least 30 minutes of physical activity a day or as directed by your caregiver. Ten minutes of physical activity timed 30 minutes after each meal is encouraged to control  postprandial blood glucose levels.    · Be alert to polyuria and polydipsia, which are early signs of hyperglycemia. An early awareness of hyperglycemia allows for prompt treatment. Treat hyperglycemia as directed by your caregiver.  · Adjust your insulin dosing and food intake as needed if you start a new exercise or sport.  · Follow your sick day plan at any time you are unable to eat or drink as usual.  · Avoid tobacco and alcohol use.  · Follow up with your caregiver regularly.  · Follow the advice of your caregiver regarding your prenatal and post-delivery (postpartum) appointments, meal planning, exercise, medicines, vitamins, blood tests, other medical tests, and physical activities.  · Continue daily skin and foot care. Examine your skin and feet daily for cuts, bruises, redness, nail problems, bleeding, blisters, or sores. A foot exam by a caregiver should be done annually.  · Brush   your teeth and gums at least twice a day and floss at least once a day. Follow up with your dentist regularly.  · Schedule an eye exam during the first trimester of your pregnancy or as directed by your caregiver.  · Share your diabetes management plan with your workplace or school.  · Stay up-to-date with immunizations.  · Learn to manage stress.  · Obtain ongoing diabetes education and support as needed.  SEEK MEDICAL CARE IF:   · You are unable to eat food or drink fluids for more than 6 hours.  · You have nausea and vomiting for more than 6 hours.  · You have a blood glucose level of 200 mg/dL and you have ketones in your urine.  · There is a change in mental status.  · You develop vision problems.  · You have a persistent headache.  · You have upper abdominal pain or discomfort.  · You develop an additional serious illness.  · You have diarrhea for more than 6 hours.  · You have been sick or have had a fever for a couple of days and are not getting better.  SEEK IMMEDIATE MEDICAL CARE IF:  · You have difficulty  breathing.  · You no longer feel the baby moving.  · You are bleeding or have discharge from your vagina.  · You start having premature contractions or labor.  MAKE SURE YOU:  · Understand these instructions.  · Will watch your condition.  · Will get help right away if you are not doing well or get worse.  Document Released: 12/22/2011 Document Revised: 03/15/2012 Document Reviewed: 12/22/2011  [employer]® Patient Information ©2014 [employer].

## 2012-11-13 NOTE — Progress Notes (Signed)
Patient purchased One Touch Mini Ultra prior to visit. Her insurance will not cover supplies for this meter. Order for Accu-check Nano and testing supplies sent to pharmacy. Message left at home phone.

## 2012-11-13 NOTE — Progress Notes (Signed)
Pulse: 97 Only seen at urgent care for pregnancy. They prescribed her the Glyburide and Prenatal Vitamins. She has not had any ultrasounds this pregnancy. She is very sure of her lmp.

## 2012-11-13 NOTE — Progress Notes (Signed)
   Subjective:new ob    [name] is a G2P1001 [redacted]w[redacted]d being seen today for her first obstetrical visit.  Her obstetrical history is significant for typs 2 diabetes. Patient does intend to breast feed. Pregnancy history fully reviewed.  Patient reports no complaints.  Filed Vitals:   11/13/12 0822  BP: 132/84  Weight: 158 lb 8 oz (71.895 kg)    HISTORY: OB History   Grav Para Term Preterm Abortions TAB SAB Ect Mult Living   2 1 1  0 0     1     # Outc Date GA Lbr Len/2nd Wgt Sex Del Anes PTL Lv   1 TRM 10/10 [redacted]w[redacted]d  8lb(3.629kg) F SVD None No Yes   Comments: DM, induced   2 CUR              Past Medical History  Diagnosis Date  . Gestational diabetes    History reviewed. No pertinent past surgical history. History reviewed. No pertinent family history.   Exam    Uterus:     Pelvic Exam:    Perineum: No Hemorrhoids   Vulva: normal   Vagina:  normal mucosa   pH:     Cervix: no lesions   Adnexa: normal adnexa   Bony Pelvis: average  System: Breast:  normal appearance, no masses or tenderness   Skin: normal coloration and turgor, no rashes    Neurologic: oriented, normal mood   Extremities: normal strength, tone, and muscle mass   HEENT thyroid without masses   Mouth/Teeth mucous membranes moist, pharynx normal without lesions   Neck supple   Cardiovascular: regular rate and rhythm, no murmurs or gallops   Respiratory:  appears well, vitals normal, no respiratory distress, acyanotic, normal RR, neck free of mass or lymphadenopathy, chest clear, no wheezing, crepitations, rhonchi, normal symmetric air entry   Abdomen: soft, non-tender; bowel sounds normal; no masses,  no organomegaly   Urinary: urethral meatus normal      Assessment:    Pregnancy: G2P1001 Patient Active Problem List   Diagnosis Date Noted  . Supervision of high risk pregnancy in second trimester 11/13/2012  . DM type 2 (diabetes mellitus, type 2) 07/13/2011  . type 2 diabetes in pregnancy  03/10/2009  . IMPAIRED GLUCOSE TOLERANCE TEST 11/15/2008        Plan:     Initial labs drawn. Prenatal vitamins. Problem list reviewed and updated. Genetic Screening discussed Quad Screen: ordered.  Ultrasound discussed; fetal survey: ordered.  Follow up in 3 weeks. 50% of 30 min visit spent on counseling and coordination of care.      [name] 11/13/2012

## 2012-11-14 LAB — OBSTETRIC PANEL
Antibody Screen: NEGATIVE
Basophils Absolute: 0 10*3/uL (ref 0.0–0.1)
Basophils Relative: 0 % (ref 0–1)
Eosinophils Absolute: 0.1 10*3/uL (ref 0.0–0.7)
Hemoglobin: 12.2 g/dL (ref 12.0–15.0)
Hepatitis B Surface Ag: NEGATIVE
MCH: 28.2 pg (ref 26.0–34.0)
MCHC: 33.1 g/dL (ref 30.0–36.0)
Monocytes Absolute: 0.5 10*3/uL (ref 0.1–1.0)
Monocytes Relative: 5 % (ref 3–12)
Neutrophils Relative %: 74 % (ref 43–77)
RDW: 14.3 % (ref 11.5–15.5)
Rh Type: POSITIVE

## 2012-11-15 LAB — HEMOGLOBINOPATHY EVALUATION
Hgb A2 Quant: 2.7 % (ref 2.2–3.2)
Hgb A: 97.3 % (ref 96.8–97.8)

## 2012-11-27 ENCOUNTER — Ambulatory Visit (INDEPENDENT_AMBULATORY_CARE_PROVIDER_SITE_OTHER): Payer: Medicaid Other | Admitting: Family Medicine

## 2012-11-27 ENCOUNTER — Encounter: Payer: Self-pay | Admitting: Family Medicine

## 2012-11-27 VITALS — BP 136/71 | Temp 98.2°F

## 2012-11-27 DIAGNOSIS — O09899 Supervision of other high risk pregnancies, unspecified trimester: Secondary | ICD-10-CM

## 2012-11-27 DIAGNOSIS — O0992 Supervision of high risk pregnancy, unspecified, second trimester: Secondary | ICD-10-CM

## 2012-11-27 DIAGNOSIS — E119 Type 2 diabetes mellitus without complications: Secondary | ICD-10-CM

## 2012-11-27 DIAGNOSIS — O24919 Unspecified diabetes mellitus in pregnancy, unspecified trimester: Secondary | ICD-10-CM

## 2012-11-27 LAB — POCT URINALYSIS DIP (DEVICE)
Bilirubin Urine: NEGATIVE
Glucose, UA: NEGATIVE mg/dL
Hgb urine dipstick: NEGATIVE
Leukocytes, UA: NEGATIVE
Nitrite: NEGATIVE
Urobilinogen, UA: 0.2 mg/dL (ref 0.0–1.0)
pH: 7 (ref 5.0–8.0)

## 2012-11-27 NOTE — Progress Notes (Signed)
P=94, c/o pain in upper back beside shoulder.

## 2012-11-27 NOTE — Progress Notes (Signed)
  Subjective:    [name] is a 37 y.o. G2P1001 at [redacted]w[redacted]d gestation being seen today for her obstetrical visit. Her obstetrical history is significant for diabetes mellitus, gestational and on oral hypoglycemic. Other risk factors include: none. Pregnancy history fully reviewed. Patient reports backache, no bleeding, no contractions, no cramping and no leaking. Fetal movement: normal. Fasting blood sugars are running between 74-94. Two-hour postprandial blood sugars are running between 96-208 (4above goal).    Menstrual History: OB History   Grav Para Term Preterm Abortions TAB SAB Ect Mult Living   2 1 1  0 0     1       Patient's last menstrual period was 07/20/2012.    The following portions of the patient's history were reviewed and updated as appropriate: allergies, current medications, past family history, past medical history, past social history, past surgical history and problem list.  Review of Systems Pertinent items are noted in HPI.    Objective:    BP 136/71  Temp(Src) 98.2 F (36.8 C)  LMP 07/20/2012 General:   alert, cooperative, appears stated age and no distress  FHT:  151 BPM  Uterine Size: size equals dates   Lab Review Urine dip: neg for pro or glu      Assessment:    1. Intrauterine pregnancy of [redacted]w[redacted]d, with normal growth. 2. Diabetes Mellitus with good  sugar control.     Glucose, Bld  Date Value Range Status  09/05/2012 175* 70 - 99 mg/dL Final     Plan:   [name] is a 37 y.o. G2P1001 at [redacted]w[redacted]d  presents for ROB  Glcuose at good control today. COntinue diet and current glyburide regimen.  Discussed with Patient:  - collect quad screen, [place] tomorrow for anatomy - Patient plans on breast feeding. - Routine precautions discussed (depression, infection s/s).   Patient provided with all pertinent phone numbers for emergencies. - RTC for any VB, regular, painful cramps/ctxs occurring at a rate of >2/10 min, fever (100.5 or higher), n/v/d, any pain that  is unresolving or worsening. - RTC in 4 weeks for next appt.  Problems: Patient Active Problem List   Diagnosis Date Noted  . Supervision of high risk pregnancy in second trimester 11/13/2012  . DM type 2 (diabetes mellitus, type 2) 07/13/2011  . type 2 diabetes in pregnancy 03/10/2009  . IMPAIRED GLUCOSE TOLERANCE TEST 11/15/2008    To Do: 1. 20 week anatomy scan ordered.  Patient to schedule.  [ ]  Vaccines: Flu:  Tdap:  [ ]  BCM: undecided  Edu: [ x] PTL precautions; [ ]  BF class; [ ]  childbirth class; [ ]   BF counseling

## 2012-11-27 NOTE — Patient Instructions (Signed)
Pregnancy - Second Trimester The second trimester of pregnancy (3 to 6 months) is a period of rapid growth for you and your baby. At the end of the sixth month, your baby is about 9 inches long and weighs 1 1/2 pounds. You will begin to feel the baby move between 18 and 20 weeks of the pregnancy. This is called quickening. Weight gain is faster. A clear fluid (colostrum) may leak out of your breasts. You may feel small contractions of the womb (uterus). This is known as false labor or Braxton-Hicks contractions. This is like a practice for labor when the baby is ready to be born. Usually, the problems with morning sickness have usually passed by the end of your first trimester. Some women develop small dark blotches (called cholasma, mask of pregnancy) on their face that usually goes away after the baby is born. Exposure to the sun makes the blotches worse. Acne may also develop in some pregnant women and pregnant women who have acne, may find that it goes away. PRENATAL EXAMS  Blood work may continue to be done during prenatal exams. These tests are done to check on your health and the probable health of your baby. Blood work is used to follow your blood levels (hemoglobin). Anemia (low hemoglobin) is common during pregnancy. Iron and vitamins are given to help prevent this. You will also be checked for diabetes between 24 and 28 weeks of the pregnancy. Some of the previous blood tests may be repeated.  The size of the uterus is measured during each visit. This is to make sure that the baby is continuing to grow properly according to the dates of the pregnancy.  Your blood pressure is checked every prenatal visit. This is to make sure you are not getting toxemia.  Your urine is checked to make sure you do not have an infection, diabetes or protein in the urine.  Your weight is checked often to make sure gains are happening at the suggested rate. This is to ensure that both you and your baby are  growing normally.  Sometimes, an ultrasound is performed to confirm the proper growth and development of the baby. This is a test which bounces harmless sound waves off the baby so your caregiver can more accurately determine due dates. Sometimes, a test is done on the amniotic fluid surrounding the baby. This test is called an amniocentesis. The amniotic fluid is obtained by sticking a needle into the belly (abdomen). This is done to check the chromosomes in instances where there is a concern about possible genetic problems with the baby. It is also sometimes done near the end of pregnancy if an early delivery is required. In this case, it is done to help make sure the baby's lungs are mature enough for the baby to live outside of the womb. CHANGES OCCURING IN THE SECOND TRIMESTER OF PREGNANCY Your body goes through many changes during pregnancy. They vary from person to person. Talk to your caregiver about changes you notice that you are concerned about.  During the second trimester, you will likely have an increase in your appetite. It is normal to have cravings for certain foods. This varies from person to person and pregnancy to pregnancy.  Your lower abdomen will begin to bulge.  You may have to urinate more often because the uterus and baby are pressing on your bladder. It is also common to get more bladder infections during pregnancy. You can help this by drinking lots of fluids   and emptying your bladder before and after intercourse.  You may begin to get stretch marks on your hips, abdomen, and breasts. These are normal changes in the body during pregnancy. There are no exercises or medicines to take that prevent this change.  You may begin to develop swollen and bulging veins (varicose veins) in your legs. Wearing support hose, elevating your feet for 15 minutes, 3 to 4 times a day and limiting salt in your diet helps lessen the problem.  Heartburn may develop as the uterus grows and  pushes up against the stomach. Antacids recommended by your caregiver helps with this problem. Also, eating smaller meals 4 to 5 times a day helps.  Constipation can be treated with a stool softener or adding bulk to your diet. Drinking lots of fluids, and eating vegetables, fruits, and whole grains are helpful.  Exercising is also helpful. If you have been very active up until your pregnancy, most of these activities can be continued during your pregnancy. If you have been less active, it is helpful to start an exercise program such as walking.  Hemorrhoids may develop at the end of the second trimester. Warm sitz baths and hemorrhoid cream recommended by your caregiver helps hemorrhoid problems.  Backaches may develop during this time of your pregnancy. Avoid heavy lifting, wear low heal shoes, and practice good posture to help with backache problems.  Some pregnant women develop tingling and numbness of their hand and fingers because of swelling and tightening of ligaments in the wrist (carpel tunnel syndrome). This goes away after the baby is born.  As your breasts enlarge, you may have to get a bigger bra. Get a comfortable, cotton, support bra. Do not get a nursing bra until the last month of the pregnancy if you will be nursing the baby.  You may get a dark line from your belly button to the pubic area called the linea nigra.  You may develop rosy cheeks because of increase blood flow to the face.  You may develop spider looking lines of the face, neck, arms, and chest. These go away after the baby is born. HOME CARE INSTRUCTIONS   It is extremely important to avoid all smoking, herbs, alcohol, and unprescribed drugs during your pregnancy. These chemicals affect the formation and growth of the baby. Avoid these chemicals throughout the pregnancy to ensure the delivery of a healthy infant.  Most of your home care instructions are the same as suggested for the first trimester of your  pregnancy. Keep your caregiver's appointments. Follow your caregiver's instructions regarding medicine use, exercise, and diet.  During pregnancy, you are providing food for you and your baby. Continue to eat regular, well-balanced meals. Choose foods such as meat, fish, milk and other low fat dairy products, vegetables, fruits, and whole-grain breads and cereals. Your caregiver will tell you of the ideal weight gain.  A physical sexual relationship may be continued up until near the end of pregnancy if there are no other problems. Problems could include early (premature) leaking of amniotic fluid from the membranes, vaginal bleeding, abdominal pain, or other medical or pregnancy problems.  Exercise regularly if there are no restrictions. Check with your caregiver if you are unsure of the safety of some of your exercises. The greatest weight gain will occur in the last 2 trimesters of pregnancy. Exercise will help you:  Control your weight.  Get you in shape for labor and delivery.  Lose weight after you have the baby.  Wear   a good support or jogging bra for breast tenderness during pregnancy. This may help if worn during sleep. Pads or tissues may be used in the bra if you are leaking colostrum.  Do not use hot tubs, steam rooms or saunas throughout the pregnancy.  Wear your seat belt at all times when driving. This protects you and your baby if you are in an accident.  Avoid raw meat, uncooked cheese, cat litter boxes, and soil used by cats. These carry germs that can cause birth defects in the baby.  The second trimester is also a good time to visit your dentist for your dental health if this has not been done yet. Getting your teeth cleaned is okay. Use a soft toothbrush. Brush gently during pregnancy.  It is easier to leak urine during pregnancy. Tightening up and strengthening the pelvic muscles will help with this problem. Practice stopping your urination while you are going to the  bathroom. These are the same muscles you need to strengthen. It is also the muscles you would use as if you were trying to stop from passing gas. You can practice tightening these muscles up 10 times a set and repeating this about 3 times per day. Once you know what muscles to tighten up, do not perform these exercises during urination. It is more likely to contribute to an infection by backing up the urine.  Ask for help if you have financial, counseling, or nutritional needs during pregnancy. Your caregiver will be able to offer counseling for these needs as well as refer you for other special needs.  Your skin may become oily. If so, wash your face with mild soap, use non-greasy moisturizer and oil or cream based makeup. MEDICINES AND DRUG USE IN PREGNANCY  Take prenatal vitamins as directed. The vitamin should contain 1 milligram of folic acid. Keep all vitamins out of reach of children. Only a couple vitamins or tablets containing iron may be fatal to a baby or young child when ingested.  Avoid use of all medicines, including herbs, over-the-counter medicines, not prescribed or suggested by your caregiver. Only take over-the-counter or prescription medicines for pain, discomfort, or fever as directed by your caregiver. Do not use aspirin.  Let your caregiver also know about herbs you may be using.  Alcohol is related to a number of birth defects. This includes fetal alcohol syndrome. All alcohol, in any form, should be avoided completely. Smoking will cause low birth rate and premature babies.  Street or illegal drugs are very harmful to the baby. They are absolutely forbidden. A baby born to an addicted mother will be addicted at birth. The baby will go through the same withdrawal an adult does. SEEK MEDICAL CARE IF:  You have any concerns or worries during your pregnancy. It is better to call with your questions if you feel they cannot wait, rather than worry about them. SEEK IMMEDIATE  MEDICAL CARE IF:   An unexplained oral temperature above 102 F (38.9 C) develops, or as your caregiver suggests.  You have leaking of fluid from the vagina (birth canal). If leaking membranes are suspected, take your temperature and tell your caregiver of this when you call.  There is vaginal spotting, bleeding, or passing clots. Tell your caregiver of the amount and how many pads are used. Light spotting in pregnancy is common, especially following intercourse.  You develop a bad smelling vaginal discharge with a change in the color from clear to white.  You continue to feel   sick to your stomach (nauseated) and have no relief from remedies suggested. You vomit blood or coffee ground-like materials.  You lose more than 2 pounds of weight or gain more than 2 pounds of weight over 1 week, or as suggested by your caregiver.  You notice swelling of your face, hands, feet, or legs.  You get exposed to [place] measles and have never had them.  You are exposed to fifth disease or chickenpox.  You develop belly (abdominal) pain. Round ligament discomfort is a common non-cancerous (benign) cause of abdominal pain in pregnancy. Your caregiver still must evaluate you.  You develop a bad headache that does not go away.  You develop fever, diarrhea, pain with urination, or shortness of breath.  You develop visual problems, blurry, or double vision.  You fall or are in a car accident or any kind of trauma.  There is mental or physical violence at home. Document Released: 03/23/2001 Document Revised: 12/22/2011 Document Reviewed: 09/25/2008 [facility] Patient Information 2014 [place], [place]. Gestational Diabetes Mellitus Gestational diabetes mellitus, often simply referred to as gestational diabetes, is a type of diabetes that some women develop during pregnancy. In gestational diabetes, the pancreas does not make enough insulin (a hormone), the cells are less responsive to the insulin that is  made (insulin resistance), or both.Normally, insulin moves sugars from food into the tissue cells. The tissue cells use the sugars for energy. The lack of insulin or the lack of normal response to insulin causes excess sugars to build up in the blood instead of going into the tissue cells. As a result, high blood sugar (hyperglycemia) develops. The effect of high sugar (glucose) levels can cause many complications.  RISK FACTORS You have an increased chance of developing gestational diabetes if you have a family history of diabetes and also have one or more of the following risk factors:  A body mass index over 30 (obesity).  A previous pregnancy with gestational diabetes.  An older age at the time of pregnancy. If blood glucose levels are kept in the normal range during pregnancy, women can have a healthy pregnancy. If your blood glucose levels are not well controlled, there may be risks to you, your unborn baby (fetus), your labor and delivery, or your newborn baby.  SYMPTOMS  If symptoms are experienced, they are much like symptoms you would normally expect during pregnancy. The symptoms of gestational diabetes include:   Increased thirst (polydipsia).  Increased urination (polyuria).  Increased urination during the night (nocturia).  Weight loss. This weight loss may be rapid.  Frequent, recurring infections.  Tiredness (fatigue).  Weakness.  Vision changes, such as blurred vision.  Fruity smell to your breath.  Abdominal pain. DIAGNOSIS Diabetes is diagnosed when blood glucose levels are increased. Your blood glucose level may be checked by one or more of the following blood tests:  A fasting blood glucose test. You will not be allowed to eat for at least 8 hours before a blood sample is taken.  A random blood glucose test. Your blood glucose is checked at any time of the day regardless of when you ate.  A hemoglobin A1c blood glucose test. A hemoglobin A1c test provides  information about blood glucose control over the previous 3 months.  An oral glucose tolerance test (OGTT). Your blood glucose is measured after you have not eaten (fasted) for 1 3 hours and then after you drink a glucose-containing beverage. Since the hormones that cause insulin resistance are highest at about 24  28 weeks of a pregnancy, an OGTT is usually performed during that time. If you have risk factors for gestational diabetes, your caregiver may test you for gestational diabetes earlier than 24 weeks of pregnancy. TREATMENT   You will need to take diabetes medicine or insulin daily to keep blood glucose levels in the desired range.  You will need to match insulin dosing with exercise and healthy food choices. The treatment goal is to maintain the before meal (preprandial), bedtime, and overnight blood glucose level at 60 99 mg/dL during pregnancy. The treatment goal is to further maintain peak after meal blood sugar (postprandial glucose) level at 100 140 mg/dL.  HOME CARE INSTRUCTIONS   Have your hemoglobin A1c level checked twice a year.  Perform daily blood glucose monitoring as directed by your caregiver. It is common to perform frequent blood glucose monitoring.  Monitor urine ketones when you are ill and as directed by your caregiver.  Take your diabetes medicine and insulin as directed by your caregiver to maintain your blood glucose level in the desired range.  Never run out of diabetes medicine or insulin. It is needed every day.  Adjust insulin based on your intake of carbohydrates. Carbohydrates can raise blood glucose levels but need to be included in your diet. Carbohydrates provide vitamins, minerals, and fiber which are an essential part of a healthy diet. Carbohydrates are found in fruits, vegetables, whole grains, dairy products, legumes, and foods containing added sugars.    Eat healthy foods. Alternate 3 meals with 3 snacks.  Maintain a healthy weight gain. The  usual total expected weight gain varies according to your prepregnancy body mass index (BMI).  Carry a medical alert card or wear your medical alert jewelry.  Carry a 15 gram carbohydrate snack with you at all times to treat low blood glucose (hypoglycemia). Some examples of 15 gram carbohydrate snacks include:  Glucose tablets, 3 or 4   Glucose gel, 15 gram tube  Raisins, 2 tablespoons (24 g)  Jelly beans, 6  Animal crackers, 8  Fruit juice, regular soda, or low fat milk, 4 ounces (120 mL)  Gummy treats, 9    Recognize hypoglycemia. Hypoglycemia during pregnancy occurs with blood glucose levels of 60 mg/dL and below. The risk for hypoglycemia increases when fasting or skipping meals, during or after intense exercise, and during sleep. Hypoglycemia symptoms can include:  Tremors or shakes.  Decreased ability to concentrate.  Sweating.  Increased heart rate.  Headache.  Dry mouth.  Hunger.  Irritability.  Anxiety.  Restless sleep.  Altered speech or coordination.  Confusion.  Treat hypoglycemia promptly. If you are alert and able to safely swallow, follow the 15:15 rule:  Take 15 20 grams of rapid-acting glucose or carbohydrate. Rapid-acting options include glucose gel, glucose tablets, or 4 ounces (120 mL) of fruit juice, regular soda, or low fat milk.  Check your blood glucose level 15 minutes after taking the glucose.   Take 15 20 grams more of glucose if the repeat blood glucose level is still 70 mg/dL or below.  Eat a meal or snack within 1 hour once blood glucose levels return to normal.  Be alert to polyuria and polydipsia which are early signs of hyperglycemia. An early awareness of hyperglycemia allows for prompt treatment. Treat hyperglycemia as directed by your caregiver.  Engage in at least 30 minutes of physical activity a day or as directed by your caregiver. Ten minutes of physical activity timed 30 minutes after each meal is encouraged  to  control postprandial blood glucose levels.  Adjust your insulin dosing and food intake as needed if you start a new exercise or sport.  Follow your sick day plan at any time you are unable to eat or drink as usual.  Avoid tobacco and alcohol use.  Follow up with your caregiver regularly.  Follow the advice of your caregiver regarding your prenatal and post-delivery (postpartum) appointments, meal planning, exercise, medicines, vitamins, blood tests, other medical tests, and physical activities.  Perform daily skin and foot care. Examine your skin and feet daily for cuts, bruises, redness, nail problems, bleeding, blisters, or sores.  Brush your teeth and gums at least twice a day and floss at least once a day. Follow up with your dentist regularly.  Schedule an eye exam during the first trimester of your pregnancy or as directed by your caregiver.  Share your diabetes management plan with your workplace or school.  Stay up-to-date with immunizations.  Learn to manage stress.  Obtain ongoing diabetes education and support as needed. SEEK MEDICAL CARE IF:   You are unable to eat food or drink fluids for more than 6 hours.  You have nausea and vomiting for more than 6 hours.  You have a blood glucose level of 200 mg/dL and you have ketones in your urine.  There is a change in mental status.  You develop vision problems.  You have a persistent headache.  You have upper abdominal pain or discomfort.  You develop an additional serious illness.  You have diarrhea for more than 6 hours.  You have been sick or have had a fever for a couple of days and are not getting better. SEEK IMMEDIATE MEDICAL CARE IF:   You have difficulty breathing.  You no longer feel the baby moving.  You are bleeding or have discharge from your vagina.  You start having premature contractions or labor. MAKE SURE YOU:  Understand these instructions.  Will watch your condition.  Will get  help right away if you are not doing well or get worse. Document Released: 07/05/2000 Document Revised: 12/22/2011 Document Reviewed: 10/26/2011 [facility] Patient Information 2014 [place], [place].

## 2012-11-27 NOTE — Addendum Note (Signed)
Addended by: Sherre Lain A on: 11/27/2012 11:40 AM   Modules accepted: Orders

## 2012-11-28 ENCOUNTER — Ambulatory Visit (HOSPITAL_COMMUNITY)
Admission: RE | Admit: 2012-11-28 | Discharge: 2012-11-28 | Disposition: A | Discharge status: Home / Self Care | Payer: Medicaid Other | Source: Ambulatory Visit | Attending: Obstetrics & Gynecology | Admitting: Obstetrics & Gynecology

## 2012-11-28 DIAGNOSIS — O99810 Abnormal glucose complicating pregnancy: Secondary | ICD-10-CM

## 2012-11-28 DIAGNOSIS — O0992 Supervision of high risk pregnancy, unspecified, second trimester: Secondary | ICD-10-CM

## 2012-11-28 DIAGNOSIS — Z363 Encounter for antenatal screening for malformations: Secondary | ICD-10-CM | POA: Insufficient documentation

## 2012-11-28 DIAGNOSIS — O358XX0 Maternal care for other (suspected) fetal abnormality and damage, not applicable or unspecified: Secondary | ICD-10-CM | POA: Insufficient documentation

## 2012-11-28 DIAGNOSIS — O09299 Supervision of pregnancy with other poor reproductive or obstetric history, unspecified trimester: Secondary | ICD-10-CM | POA: Insufficient documentation

## 2012-11-28 DIAGNOSIS — Z1389 Encounter for screening for other disorder: Secondary | ICD-10-CM | POA: Insufficient documentation

## 2012-11-30 ENCOUNTER — Encounter: Payer: Self-pay | Admitting: *Deleted

## 2012-11-30 DIAGNOSIS — O0992 Supervision of high risk pregnancy, unspecified, second trimester: Secondary | ICD-10-CM

## 2012-12-08 ENCOUNTER — Encounter: Payer: Self-pay | Admitting: *Deleted

## 2012-12-08 ENCOUNTER — Telehealth: Payer: Self-pay | Admitting: *Deleted

## 2012-12-08 DIAGNOSIS — O280 Abnormal hematological finding on antenatal screening of mother: Secondary | ICD-10-CM

## 2012-12-08 NOTE — Telephone Encounter (Signed)
Scheduled appointment with MFM for 12/12/12 9am and called [name] and informed her of results and appointment. [name] voices understanding.

## 2012-12-08 NOTE — Telephone Encounter (Signed)
Message copied by Gerome Apley on Fri Dec 08, 2012 11:48 AM ------      Message from: Darrel Hoover      Created: Thu Nov 30, 2012  2:50 PM      Regarding: Schedule appointment and Notify patient of results       Quad screen results scanned to chart.  Needs referral to MFM for genetic counseling and amniocentesis.  Please schedule appointment with MFM and notify patient of results and appointment.      Thanks,      Tresa Endo ------

## 2012-12-12 ENCOUNTER — Ambulatory Visit (HOSPITAL_COMMUNITY)
Admission: RE | Admit: 2012-12-12 | Discharge: 2012-12-12 | Disposition: A | Discharge status: Home / Self Care | Payer: Medicaid Other | Source: Ambulatory Visit | Attending: Obstetrics & Gynecology | Admitting: Obstetrics & Gynecology

## 2012-12-12 ENCOUNTER — Other Ambulatory Visit: Payer: Self-pay

## 2012-12-12 DIAGNOSIS — O3510X0 Maternal care for (suspected) chromosomal abnormality in fetus, unspecified, not applicable or unspecified: Secondary | ICD-10-CM | POA: Insufficient documentation

## 2012-12-12 DIAGNOSIS — IMO0002 Reserved for concepts with insufficient information to code with codable children: Secondary | ICD-10-CM | POA: Insufficient documentation

## 2012-12-12 DIAGNOSIS — O351XX0 Maternal care for (suspected) chromosomal abnormality in fetus, not applicable or unspecified: Secondary | ICD-10-CM | POA: Insufficient documentation

## 2012-12-12 NOTE — Progress Notes (Signed)
Genetic Counseling  High-Risk Gestation Note  Appointment Date:  12/12/2012 Referred By: Tereso Newcomer, MD Date of Birth:  16-Jan-1976    Pregnancy History: G2P1001 Estimated Date of Delivery: 04/26/13 Estimated Gestational Age: [redacted]w[redacted]d Attending: Alpha Gula, MD   [name] and her partner, Mr. [name], were seen for genetic counseling because of an increased risk for fetal trisomy 18 based on Quad screen performed through [facility]. The patient will be 37 years old at delivery.   They were counseled regarding maternal age and the association with risk for chromosome conditions due to nondisjunction with aging of the ova.   We reviewed chromosomes, nondisjunction, and the associated 1 in 141 risk for fetal aneuploidy at [redacted]w[redacted]d gestation related to a maternal age of 37 years old at delivery.  They were counseled that the risk for aneuploidy decreases as gestational age increases, accounting for those pregnancies which spontaneously abort.  We specifically discussed Down syndrome (trisomy 52), trisomies 45 and 26, and sex chromosome aneuploidies (47,XXX and 47,XXY) including the common features and prognoses of each.   We also reviewed [name] maternal serum Quad screen result and the associated increase in risk for fetal Trisomy 18 (1 in 427 to 1 in 23).  They were counseled regarding other explanations for a screen positive result including normal variation and differences in maternal metabolism.  In addition, we reviewed the screen adjusted reduction in risks for Down syndrome (1 in 1,284 to 1 in 3,778) and ONTDs.  We discussed that these results were from the second sample. The patient had a previous Quad screen performed on 11/13/12, which was screen negative for these conditions. However, this first sample also indicated an increased risk for Trisomy 18 of 1 in 105, but this is considered below the screen's cutoff and is thus, a negative result. We discussed  with the couple that it is unclear why two Quad screens were performed. They understand that Quad screening provides a pregnancy specific risk for Down syndrome, but is not considered to be diagnostic.    We reviewed available screening options including noninvasive prenatal screening (NIPS)/cell free fetal DNA (cffDNA) testing, and detailed ultrasound.  They were counseled that screening tests are used to modify a patient's a priori risk for aneuploidy, typically based on age. This estimate provides a pregnancy specific risk assessment. We reviewed the benefits and limitations of each option. Specifically, we discussed the conditions for which each test screens, the detection rates, and false positive rates of each. They were also counseled regarding diagnostic testing via amniocentesis. We reviewed the approximate 1 in 300-500 risk for complications for amniocentesis, including spontaneous pregnancy loss. [name] previously had ultrasound performed on 11/28/12 through [facility]. We reviewed that this ultrasound reported visualized fetal anatomy was normal. However, fetal survey was limited by fetal position. Thus, a follow-up ultrasound was planned for 12/27/12.  After consideration of all the options, they elected to proceed with NIPS (Panorama).  The couple declined amniocentesis given the associated risk of complications. They understand that screening tests cannot rule out all birth defects or genetic syndromes.   Both family histories were reviewed and found to be noncontributory for birth defects, intellectual disability, recurrent pregnancy loss, or known genetic conditions. Without further information regarding the provided family history, an accurate genetic risk cannot be calculated. Further genetic counseling is warranted if more information is obtained.   The father of the pregnancy reported that he is 35 years old. Advanced  paternal age (APA) is defined as paternal age greater than  or equal to age 47.  Recent large-scale sequencing studies have shown that approximately 80% of de novo point mutations are of paternal origin.  Many studies have demonstrated a strong correlation between increased paternal age and de novo point mutations.  Although no specific data is available regarding fetal risks for fathers 71+ years old at conception, it is apparent that the overall risk for single gene conditions is increased.  To estimate the relative increase in risk of a genetic disorder with APA, the heritability of the disease must be considered.  Assuming an approximate 2x increase in risk for conditions that are exclusively paternal in origin, the risk for each individual condition is still relatively low.  It is estimated that the overall chance for a de novo mutation is ~0.5%.  There is a wide range of conditions which can be caused by new dominant gene mutations (achondroplasia, neurofibromatosis, Marfan syndrome etc.).  Genetic testing for each individual single gene condition is not warranted or available unless ultrasound or family history concerns lend suspicion to a specific condition.    [name] denied exposure to environmental toxins or chemical agents. She denied the use of alcohol, tobacco or street drugs. She denied significant viral illnesses during the course of her pregnancy. Her medical and surgical histories were contributor for diabetes mellitus, for which she is currently treated with glyburide.   I counseled this couple for approximately 45 minutes regarding the above risks and available options.   Quinn Plowman, MS,  Certified Genetic Counselor 12/12/2012

## 2012-12-18 ENCOUNTER — Ambulatory Visit (INDEPENDENT_AMBULATORY_CARE_PROVIDER_SITE_OTHER): Payer: Medicaid Other | Admitting: Obstetrics & Gynecology

## 2012-12-18 VITALS — BP 130/73 | Temp 97.1°F | Wt 163.0 lb

## 2012-12-18 DIAGNOSIS — O99810 Abnormal glucose complicating pregnancy: Secondary | ICD-10-CM

## 2012-12-18 DIAGNOSIS — O0992 Supervision of high risk pregnancy, unspecified, second trimester: Secondary | ICD-10-CM

## 2012-12-18 LAB — POCT URINALYSIS DIP (DEVICE)
Bilirubin Urine: NEGATIVE
Leukocytes, UA: NEGATIVE
Nitrite: NEGATIVE
Urobilinogen, UA: 0.2 mg/dL (ref 0.0–1.0)
pH: 6.5 (ref 5.0–8.0)

## 2012-12-18 MED ORDER — GLYBURIDE 5 MG PO TABS: ORAL_TABLET | ORAL | Status: DC

## 2012-12-18 NOTE — Progress Notes (Signed)
17 week anatomy scan was incomplete, already scheduled for follow up.  Fasting BS are 90-100s, lunch PP elevated in 140s.  Changed regimen to Glyburide 5mg  am and 7.5mg  qhs, reevaluate in one week.  No other complaints or concerns.  Routine obstetric precautions reviewed.

## 2012-12-18 NOTE — Patient Instructions (Signed)
Return to clinic for any obstetric concerns or go to [facility] for evaluation  

## 2012-12-18 NOTE — Progress Notes (Signed)
Pulse: 104

## 2012-12-19 ENCOUNTER — Telehealth (HOSPITAL_COMMUNITY): Payer: Self-pay | Admitting: MS"

## 2012-12-19 NOTE — Telephone Encounter (Signed)
Spoke with Rhoderick Moody, husband of [name], who was present with patient at the time of her genetic counseling visit. Called Relena Ivancic to discuss her cell free fetal DNA test results.  Mrs. [name] had Panorama testing through [facility].  Testing was offered because of previous abnormal quad screen for Trisomy 18.   The patient was identified by name and DOB.  We reviewed that these are within normal limits, showing a less than 1 in 10,000 risk for trisomies 21, 18 and 13, and monosomy X (Turner syndrome).  In addition, the risk for triploidy/vanishing twin and sex chromosome trisomies (47,XXX and 47,XXY) was also low risk.  We reviewed that this testing identifies > 99% of pregnancies with trisomy 24, trisomy 70, trisomy 37, sex chromosome trisomies (47,XXX and 47,XXY), and triploidy.  The detection rate for monosomy X is ~92%.  The false positive rate is <0.1% for all conditions. Testing was also consistent with female gender.   We discussed that this testing does not identify all genetic conditions.  All questions were answered to his satisfaction, he was encouraged to call with additional questions or concerns.  Quinn Plowman, MS Certified Genetic Counselor 12/19/2012 10:55 AM

## 2012-12-25 ENCOUNTER — Ambulatory Visit (INDEPENDENT_AMBULATORY_CARE_PROVIDER_SITE_OTHER): Payer: Medicaid Other | Admitting: Family Medicine

## 2012-12-25 VITALS — BP 128/70 | Temp 97.7°F | Wt 161.2 lb

## 2012-12-25 DIAGNOSIS — E119 Type 2 diabetes mellitus without complications: Secondary | ICD-10-CM

## 2012-12-25 DIAGNOSIS — O24919 Unspecified diabetes mellitus in pregnancy, unspecified trimester: Secondary | ICD-10-CM

## 2012-12-25 LAB — POCT URINALYSIS DIP (DEVICE)
Ketones, ur: NEGATIVE mg/dL
Leukocytes, UA: NEGATIVE
Nitrite: NEGATIVE
Protein, ur: NEGATIVE mg/dL
Urobilinogen, UA: 0.2 mg/dL (ref 0.0–1.0)

## 2012-12-25 NOTE — Patient Instructions (Signed)
Gestational Diabetes Mellitus Gestational diabetes mellitus, often simply referred to as gestational diabetes, is a type of diabetes that some women develop during pregnancy. In gestational diabetes, the pancreas does not make enough insulin (a hormone), the cells are less responsive to the insulin that is made (insulin resistance), or both.Normally, insulin moves sugars from food into the tissue cells. The tissue cells use the sugars for energy. The lack of insulin or the lack of normal response to insulin causes excess sugars to build up in the blood instead of going into the tissue cells. As a result, high blood sugar (hyperglycemia) develops. The effect of high sugar (glucose) levels can cause many complications.  RISK FACTORS You have an increased chance of developing gestational diabetes if you have a family history of diabetes and also have one or more of the following risk factors:  A body mass index over 30 (obesity).  A previous pregnancy with gestational diabetes.  An older age at the time of pregnancy. If blood glucose levels are kept in the normal range during pregnancy, women can have a healthy pregnancy. If your blood glucose levels are not well controlled, there may be risks to you, your unborn baby (fetus), your labor and delivery, or your newborn baby.  SYMPTOMS  If symptoms are experienced, they are much like symptoms you would normally expect during pregnancy. The symptoms of gestational diabetes include:   Increased thirst (polydipsia).  Increased urination (polyuria).  Increased urination during the night (nocturia).  Weight loss. This weight loss may be rapid.  Frequent, recurring infections.  Tiredness (fatigue).  Weakness.  Vision changes, such as blurred vision.  Fruity smell to your breath.  Abdominal pain. DIAGNOSIS Diabetes is diagnosed when blood glucose levels are increased. Your blood glucose level may be checked by one or more of the following  blood tests:  A fasting blood glucose test. You will not be allowed to eat for at least 8 hours before a blood sample is taken.  A random blood glucose test. Your blood glucose is checked at any time of the day regardless of when you ate.  A hemoglobin A1c blood glucose test. A hemoglobin A1c test provides information about blood glucose control over the previous 3 months.  An oral glucose tolerance test (OGTT). Your blood glucose is measured after you have not eaten (fasted) for 1 3 hours and then after you drink a glucose-containing beverage. Since the hormones that cause insulin resistance are highest at about 24 28 weeks of a pregnancy, an OGTT is usually performed during that time. If you have risk factors for gestational diabetes, your caregiver may test you for gestational diabetes earlier than 24 weeks of pregnancy. TREATMENT   You will need to take diabetes medicine or insulin daily to keep blood glucose levels in the desired range.  You will need to match insulin dosing with exercise and healthy food choices. The treatment goal is to maintain the before meal (preprandial), bedtime, and overnight blood glucose level at 60 99 mg/dL during pregnancy. The treatment goal is to further maintain peak after meal blood sugar (postprandial glucose) level at 100 140 mg/dL.  HOME CARE INSTRUCTIONS   Have your hemoglobin A1c level checked twice a year.  Perform daily blood glucose monitoring as directed by your caregiver. It is common to perform frequent blood glucose monitoring.  Monitor urine ketones when you are ill and as directed by your caregiver.  Take your diabetes medicine and insulin as directed by your caregiver to   maintain your blood glucose level in the desired range.  Never run out of diabetes medicine or insulin. It is needed every day.  Adjust insulin based on your intake of carbohydrates. Carbohydrates can raise blood glucose levels but need to be included in your diet.  Carbohydrates provide vitamins, minerals, and fiber which are an essential part of a healthy diet. Carbohydrates are found in fruits, vegetables, whole grains, dairy products, legumes, and foods containing added sugars.    Eat healthy foods. Alternate 3 meals with 3 snacks.  Maintain a healthy weight gain. The usual total expected weight gain varies according to your prepregnancy body mass index (BMI).  Carry a medical alert card or wear your medical alert jewelry.  Carry a 15 gram carbohydrate snack with you at all times to treat low blood glucose (hypoglycemia). Some examples of 15 gram carbohydrate snacks include:  Glucose tablets, 3 or 4   Glucose gel, 15 gram tube  Raisins, 2 tablespoons (24 g)  Jelly beans, 6  Animal crackers, 8  Fruit juice, regular soda, or low fat milk, 4 ounces (120 mL)  Gummy treats, 9    Recognize hypoglycemia. Hypoglycemia during pregnancy occurs with blood glucose levels of 60 mg/dL and below. The risk for hypoglycemia increases when fasting or skipping meals, during or after intense exercise, and during sleep. Hypoglycemia symptoms can include:  Tremors or shakes.  Decreased ability to concentrate.  Sweating.  Increased heart rate.  Headache.  Dry mouth.  Hunger.  Irritability.  Anxiety.  Restless sleep.  Altered speech or coordination.  Confusion.  Treat hypoglycemia promptly. If you are alert and able to safely swallow, follow the 15:15 rule:  Take 15 20 grams of rapid-acting glucose or carbohydrate. Rapid-acting options include glucose gel, glucose tablets, or 4 ounces (120 mL) of fruit juice, regular soda, or low fat milk.  Check your blood glucose level 15 minutes after taking the glucose.   Take 15 20 grams more of glucose if the repeat blood glucose level is still 70 mg/dL or below.  Eat a meal or snack within 1 hour once blood glucose levels return to normal.  Be alert to polyuria and polydipsia which are early  signs of hyperglycemia. An early awareness of hyperglycemia allows for prompt treatment. Treat hyperglycemia as directed by your caregiver.  Engage in at least 30 minutes of physical activity a day or as directed by your caregiver. Ten minutes of physical activity timed 30 minutes after each meal is encouraged to control postprandial blood glucose levels.  Adjust your insulin dosing and food intake as needed if you start a new exercise or sport.  Follow your sick day plan at any time you are unable to eat or drink as usual.  Avoid tobacco and alcohol use.  Follow up with your caregiver regularly.  Follow the advice of your caregiver regarding your prenatal and post-delivery (postpartum) appointments, meal planning, exercise, medicines, vitamins, blood tests, other medical tests, and physical activities.  Perform daily skin and foot care. Examine your skin and feet daily for cuts, bruises, redness, nail problems, bleeding, blisters, or sores.  Brush your teeth and gums at least twice a day and floss at least once a day. Follow up with your dentist regularly.  Schedule an eye exam during the first trimester of your pregnancy or as directed by your caregiver.  Share your diabetes management plan with your workplace or school.  Stay up-to-date with immunizations.  Learn to manage stress.  Obtain ongoing diabetes education   and support as needed. SEEK MEDICAL CARE IF:   You are unable to eat food or drink fluids for more than 6 hours.  You have nausea and vomiting for more than 6 hours.  You have a blood glucose level of 200 mg/dL and you have ketones in your urine.  There is a change in mental status.  You develop vision problems.  You have a persistent headache.  You have upper abdominal pain or discomfort.  You develop an additional serious illness.  You have diarrhea for more than 6 hours.  You have been sick or have had a fever for a couple of days and are not getting  better. SEEK IMMEDIATE MEDICAL CARE IF:   You have difficulty breathing.  You no longer feel the baby moving.  You are bleeding or have discharge from your vagina.  You start having premature contractions or labor. MAKE SURE YOU:  Understand these instructions.  Will watch your condition.  Will get help right away if you are not doing well or get worse. Document Released: 07/05/2000 Document Revised: 12/22/2011 Document Reviewed: 10/26/2011 Exit[employer] Patient Information 2014 [employer].  

## 2012-12-25 NOTE — Progress Notes (Signed)
  Subjective:    [name] is a 37 y.o. G2P1001 at [redacted]w[redacted]d gestation being seen today for her obstetrical visit. Her obstetrical history is significant for diabetes mellitus, gestational. Other risk factors include: obesity. Pregnancy history fully reviewed. Patient reports no bleeding, no contractions, no cramping and no leaking. Fetal movement: normal. Fasting blood sugars are running between <95. Two-hour postprandial blood sugars are running between 102-140 ( only 3 >120).   Pt seen last week and started on glyburide 5mg  Qam, and 7.5 QHS. Pt felt hypoglycemic  Menstrual History: OB History   Grav Para Term Preterm Abortions TAB SAB Ect Mult Living   2 1 1  0 0     1      Patient's last menstrual period was 07/20/2012.    The following portions of the patient's history were reviewed and updated as appropriate: allergies, current medications, past family history, past medical history, past social history, past surgical history and problem list.  Review of Systems Pertinent items are noted in HPI.    Objective:    BP 128/70  Temp(Src) 97.7 F (36.5 C)  Wt 161 lb 3.2 oz (73.12 kg)  BMI 30.47 kg/m2  LMP 07/20/2012 General:   alert, cooperative, appears stated age and no distress  FHT:  156 BPM  Uterine Size: 20 cm and size equals dates   Assessment:    1. Intrauterine pregnancy of [redacted]w[redacted]d, with normal growth. 2. Diabetes Mellitus with good  sugar control. 3. NST: NA at 32 wks     Glucose, Bld  Date Value Range Status  09/05/2012 175* 70 - 99 mg/dL Final     Plan:   [name] is a 37 y.o. G2P1001 at [redacted]w[redacted]d here for ROB visit.  GDMA2 vs Pregestational Diabetes: continue glyburide. Excellent control at this time  Discussed with Patient:  -Plans to breast feed.  All questions answered. -Continue prenatal vitamins. - Reviewed genetics screen (Quad screen / first trimester screen / serum integrated screen / full integrated screen done/ not done).   - Routine precautions discussed  (depression, infection s/s).   Patient provided with all pertinent phone numbers for emergencies. - RTC for any VB, regular, painful cramps/ctxs occurring at a rate of >2/10 min, fever (100.5 or higher), n/v/d, any pain that is unresolving or worsening, LOF, decreased fetal movement, CP, SOB, edema  Problems: Patient Active Problem List   Diagnosis Date Noted  . Supervision of high risk pregnancy in second trimester 11/13/2012  . DM type 2 (diabetes mellitus, type 2) 07/13/2011  . type 2 diabetes in pregnancy 03/10/2009  . IMPAIRED GLUCOSE TOLERANCE TEST 11/15/2008    To Do:  [ ]  Vaccines: Flu:  Tdap:  [ ]  BCM:   Edu: [ x] PTL precautions; [ ]  BF class; [ ]  childbirth class; [ ]   BF counseling;    Tawana Scale, MD OB Fellow

## 2012-12-25 NOTE — Progress Notes (Signed)
Pulse

## 2012-12-26 ENCOUNTER — Other Ambulatory Visit: Payer: Self-pay | Admitting: Family Medicine

## 2012-12-26 DIAGNOSIS — Z0489 Encounter for examination and observation for other specified reasons: Secondary | ICD-10-CM

## 2012-12-26 DIAGNOSIS — IMO0002 Reserved for concepts with insufficient information to code with codable children: Secondary | ICD-10-CM

## 2012-12-27 ENCOUNTER — Ambulatory Visit (HOSPITAL_COMMUNITY)
Admission: RE | Admit: 2012-12-27 | Discharge: 2012-12-27 | Disposition: A | Discharge status: Home / Self Care | Payer: Medicaid Other | Source: Ambulatory Visit | Attending: Obstetrics & Gynecology | Admitting: Obstetrics & Gynecology

## 2012-12-27 VITALS — BP 122/75 | HR 87 | Wt 161.5 lb

## 2012-12-27 DIAGNOSIS — O09529 Supervision of elderly multigravida, unspecified trimester: Secondary | ICD-10-CM | POA: Insufficient documentation

## 2012-12-27 DIAGNOSIS — O99810 Abnormal glucose complicating pregnancy: Secondary | ICD-10-CM

## 2012-12-27 DIAGNOSIS — Z0489 Encounter for examination and observation for other specified reasons: Secondary | ICD-10-CM

## 2012-12-27 DIAGNOSIS — O09299 Supervision of pregnancy with other poor reproductive or obstetric history, unspecified trimester: Secondary | ICD-10-CM | POA: Insufficient documentation

## 2012-12-27 DIAGNOSIS — O289 Unspecified abnormal findings on antenatal screening of mother: Secondary | ICD-10-CM | POA: Insufficient documentation

## 2012-12-27 DIAGNOSIS — IMO0002 Reserved for concepts with insufficient information to code with codable children: Secondary | ICD-10-CM

## 2012-12-27 DIAGNOSIS — Z3689 Encounter for other specified antenatal screening: Secondary | ICD-10-CM | POA: Insufficient documentation

## 2012-12-27 NOTE — Progress Notes (Signed)
[name]  was seen today for an ultrasound appointment.  See full report in [facility].  Impression: Single IUP at 21 4/7 weeks Increased risk for Trisomy 18 (1:23) - low risk NIPS, suspected pregestational diabetes on Glyburide Normal fetal anatomic survey.  Somewhat limited views of the fetal heart obtained (outflow tracts) Normal amniotic fluid volume  Recommendations: Based on initial HbA1C of 9.5, recommend fetal echo (scheduled) Follow up ultrasound in 4 weeks to reevalaute the fetal heart/ growth.  Alpha Gula, MD

## 2013-01-15 ENCOUNTER — Other Ambulatory Visit: Payer: Self-pay

## 2013-01-19 ENCOUNTER — Encounter: Payer: Self-pay | Admitting: *Deleted

## 2013-01-22 ENCOUNTER — Ambulatory Visit (INDEPENDENT_AMBULATORY_CARE_PROVIDER_SITE_OTHER): Payer: Medicaid Other | Admitting: Obstetrics and Gynecology

## 2013-01-22 VITALS — BP 133/85 | Temp 97.7°F

## 2013-01-22 DIAGNOSIS — O99810 Abnormal glucose complicating pregnancy: Secondary | ICD-10-CM

## 2013-01-22 DIAGNOSIS — Z23 Encounter for immunization: Secondary | ICD-10-CM

## 2013-01-22 DIAGNOSIS — O0992 Supervision of high risk pregnancy, unspecified, second trimester: Secondary | ICD-10-CM

## 2013-01-22 DIAGNOSIS — E119 Type 2 diabetes mellitus without complications: Secondary | ICD-10-CM

## 2013-01-22 LAB — POCT URINALYSIS DIP (DEVICE)
Glucose, UA: NEGATIVE mg/dL
Hgb urine dipstick: NEGATIVE
Nitrite: NEGATIVE
Protein, ur: NEGATIVE mg/dL
Specific Gravity, Urine: 1.025 (ref 1.005–1.030)
Urobilinogen, UA: 0.2 mg/dL (ref 0.0–1.0)

## 2013-01-22 NOTE — Patient Instructions (Signed)

## 2013-01-22 NOTE — Progress Notes (Signed)
P= 97 Requests flu vaccine today.

## 2013-01-22 NOTE — Progress Notes (Signed)
Type 2 DM. [place] reviewed> F/U 1 month with MFM. Fetal echo normal. F: 88-100, 4/7 over 90. PP mostly in range with occ 120'2 -130. Cont Glyburide 5mg  bid (takes with breakfast and dinner> advised same time qd. Reviewed diet and feels she can do better> reevaluate 1 wk.  Flu vaccine today

## 2013-01-24 ENCOUNTER — Encounter (HOSPITAL_COMMUNITY): Payer: Self-pay

## 2013-01-24 ENCOUNTER — Ambulatory Visit (HOSPITAL_COMMUNITY)
Admission: RE | Admit: 2013-01-24 | Discharge: 2013-01-24 | Disposition: A | Discharge status: Home / Self Care | Payer: Medicaid Other | Source: Ambulatory Visit | Attending: Obstetrics and Gynecology | Admitting: Obstetrics and Gynecology

## 2013-01-24 DIAGNOSIS — IMO0002 Reserved for concepts with insufficient information to code with codable children: Secondary | ICD-10-CM

## 2013-01-24 DIAGNOSIS — O289 Unspecified abnormal findings on antenatal screening of mother: Secondary | ICD-10-CM | POA: Insufficient documentation

## 2013-01-24 DIAGNOSIS — Z0489 Encounter for examination and observation for other specified reasons: Secondary | ICD-10-CM

## 2013-01-24 DIAGNOSIS — O09299 Supervision of pregnancy with other poor reproductive or obstetric history, unspecified trimester: Secondary | ICD-10-CM | POA: Insufficient documentation

## 2013-01-24 DIAGNOSIS — O99810 Abnormal glucose complicating pregnancy: Secondary | ICD-10-CM | POA: Insufficient documentation

## 2013-01-24 DIAGNOSIS — O09529 Supervision of elderly multigravida, unspecified trimester: Secondary | ICD-10-CM | POA: Insufficient documentation

## 2013-01-29 ENCOUNTER — Encounter: Payer: Self-pay | Admitting: Obstetrics and Gynecology

## 2013-01-29 ENCOUNTER — Ambulatory Visit (INDEPENDENT_AMBULATORY_CARE_PROVIDER_SITE_OTHER): Payer: Medicaid Other | Admitting: Obstetrics and Gynecology

## 2013-01-29 VITALS — BP 136/89 | Temp 98.0°F | Wt 163.1 lb

## 2013-01-29 DIAGNOSIS — E119 Type 2 diabetes mellitus without complications: Secondary | ICD-10-CM

## 2013-01-29 DIAGNOSIS — O99810 Abnormal glucose complicating pregnancy: Secondary | ICD-10-CM

## 2013-01-29 DIAGNOSIS — O0992 Supervision of high risk pregnancy, unspecified, second trimester: Secondary | ICD-10-CM

## 2013-01-29 LAB — POCT URINALYSIS DIP (DEVICE)
Glucose, UA: NEGATIVE mg/dL
Nitrite: NEGATIVE
Urobilinogen, UA: 0.2 mg/dL (ref 0.0–1.0)
pH: 7.5 (ref 5.0–8.0)

## 2013-01-29 NOTE — Progress Notes (Signed)
Pulse- 89  Edema-feet

## 2013-01-29 NOTE — Progress Notes (Signed)
Patient doing well without complaints. FM/PTL precautions reviewed. CBG's within range. Patient already scheduled for follow up growth scan with MFM in November

## 2013-02-12 ENCOUNTER — Ambulatory Visit (INDEPENDENT_AMBULATORY_CARE_PROVIDER_SITE_OTHER): Payer: Medicaid Other | Admitting: Obstetrics & Gynecology

## 2013-02-12 ENCOUNTER — Ambulatory Visit (HOSPITAL_COMMUNITY)
Admission: RE | Admit: 2013-02-12 | Discharge: 2013-02-12 | Disposition: A | Discharge status: Home / Self Care | Payer: Medicaid Other | Source: Ambulatory Visit | Attending: Obstetrics & Gynecology | Admitting: Obstetrics & Gynecology

## 2013-02-12 ENCOUNTER — Ambulatory Visit (HOSPITAL_COMMUNITY): Admission: RE | Admit: 2013-02-12 | Payer: Medicaid Other | Source: Ambulatory Visit

## 2013-02-12 VITALS — BP 145/80 | Temp 97.0°F | Wt 163.7 lb

## 2013-02-12 DIAGNOSIS — O99810 Abnormal glucose complicating pregnancy: Secondary | ICD-10-CM | POA: Insufficient documentation

## 2013-02-12 DIAGNOSIS — O09529 Supervision of elderly multigravida, unspecified trimester: Secondary | ICD-10-CM | POA: Insufficient documentation

## 2013-02-12 DIAGNOSIS — Z23 Encounter for immunization: Secondary | ICD-10-CM

## 2013-02-12 DIAGNOSIS — E119 Type 2 diabetes mellitus without complications: Secondary | ICD-10-CM

## 2013-02-12 DIAGNOSIS — O133 Gestational [pregnancy-induced] hypertension without significant proteinuria, third trimester: Secondary | ICD-10-CM

## 2013-02-12 DIAGNOSIS — O139 Gestational [pregnancy-induced] hypertension without significant proteinuria, unspecified trimester: Secondary | ICD-10-CM

## 2013-02-12 DIAGNOSIS — O289 Unspecified abnormal findings on antenatal screening of mother: Secondary | ICD-10-CM | POA: Insufficient documentation

## 2013-02-12 DIAGNOSIS — O0992 Supervision of high risk pregnancy, unspecified, second trimester: Secondary | ICD-10-CM

## 2013-02-12 DIAGNOSIS — O09299 Supervision of pregnancy with other poor reproductive or obstetric history, unspecified trimester: Secondary | ICD-10-CM | POA: Insufficient documentation

## 2013-02-12 LAB — CBC
HCT: 35.1 % — ABNORMAL LOW (ref 36.0–46.0)
Hemoglobin: 12.4 g/dL (ref 12.0–15.0)
MCHC: 35.3 g/dL (ref 30.0–36.0)
MCV: 82 fL (ref 78.0–100.0)
RBC: 4.28 MIL/uL (ref 3.87–5.11)
RDW: 14 % (ref 11.5–15.5)
WBC: 8.3 10*3/uL (ref 4.0–10.5)

## 2013-02-12 LAB — COMPREHENSIVE METABOLIC PANEL
AST: 16 U/L (ref 0–37)
Albumin: 3.6 g/dL (ref 3.5–5.2)
Alkaline Phosphatase: 67 U/L (ref 39–117)
BUN: 5 mg/dL — ABNORMAL LOW (ref 6–23)
Calcium: 9.2 mg/dL (ref 8.4–10.5)
Chloride: 105 mEq/L (ref 96–112)
Creat: 0.48 mg/dL — ABNORMAL LOW (ref 0.50–1.10)
Glucose, Bld: 78 mg/dL (ref 70–99)
Potassium: 3.6 mEq/L (ref 3.5–5.3)
Sodium: 139 mEq/L (ref 135–145)
Total Bilirubin: 0.3 mg/dL (ref 0.3–1.2)

## 2013-02-12 LAB — POCT URINALYSIS DIP (DEVICE)
Bilirubin Urine: NEGATIVE
Glucose, UA: NEGATIVE mg/dL
Protein, ur: NEGATIVE mg/dL
Specific Gravity, Urine: 1.02 (ref 1.005–1.030)

## 2013-02-12 MED ORDER — TETANUS-DIPHTH-ACELL PERTUSSIS 5-2.5-18.5 LF-MCG/0.5 IM SUSP: 0.5000 mL | Freq: Once | INTRAMUSCULAR | Status: DC

## 2013-02-12 NOTE — Addendum Note (Signed)
Addended by: Kathee Delton on: 02/12/2013 10:54 AM   Modules accepted: Orders

## 2013-02-12 NOTE — Progress Notes (Signed)
Pulse 97 Pt complaining of a cough x 2 weeks. Feels tired, and has itchy throat.

## 2013-02-12 NOTE — Progress Notes (Signed)
Stat BPP scheduled for this morning. Patient taken to lab and staff member to walk patient up to the U/S department.

## 2013-02-12 NOTE — Progress Notes (Signed)
New onset of increased BP.  Will r/o for preeclampsia.  Pt denies headache, scotomata, RUQ pain.  BPP stat. Fastings all <90; pp breakfast 81-122, pp lunch 92-130  Pp dinner 95-120.  Continue glyburide. Tdap today

## 2013-02-13 LAB — HIV ANTIBODY (ROUTINE TESTING W REFLEX): HIV: NONREACTIVE

## 2013-02-14 ENCOUNTER — Encounter: Payer: Self-pay | Admitting: *Deleted

## 2013-02-15 ENCOUNTER — Ambulatory Visit: Payer: Medicaid Other

## 2013-02-15 DIAGNOSIS — I10 Essential (primary) hypertension: Secondary | ICD-10-CM

## 2013-02-15 NOTE — Progress Notes (Signed)
Pt. Here in clinic today for a BP check. BP 136/93 with automatic. 128/70 manually. Pt. Denies headaches, dizzyness, SOB, RUQ pain. Discussed pt. With MD Annabelle Harman Muhamed. Agreed pt. BP was WNL and pt. Was OK to go. Informed pt. Of next fu appointment and pt. Stated she already knew. Informed pt. That if she has any headaches, dizziness, SOB, RUQ pain then to call the office. Pt. Verbalized understanding and had no other questions.

## 2013-02-16 LAB — PROTEIN, URINE, 24 HOUR: Protein, Urine: 9 mg/dL

## 2013-02-16 LAB — CREATININE CLEARANCE, URINE, 24 HOUR
Creatinine Clearance: 161 mL/min — ABNORMAL HIGH (ref 75–115)
Creatinine, Urine: 56.3 mg/dL
Creatinine: 0.48 mg/dL — ABNORMAL LOW (ref 0.50–1.10)

## 2013-02-18 MED ORDER — BETAMETHASONE SOD PHOS & ACET 6 (3-3) MG/ML IJ SUSP: 12.0000 mg | Freq: Every day | INTRAMUSCULAR | Status: AC

## 2013-02-18 NOTE — Progress Notes (Deleted)
24 hour urine is nml (168 mg protein); plt are 136.  AST,ALT, creat, hgb are nml.

## 2013-02-18 NOTE — Progress Notes (Signed)
Pt has gestational hypertension; 24 hour urine is nml.  Plt 136.  Rest of labs are nml.  BPP is 8/8.  Discussed case with MFM [facility]--will proceed with steroids.  Will not make any management decisions on CBG until steroids are out of her system.  Will do weekly BPP until 32 weeks then 2x weekly testing.  Pt should be instructed on fetal kick counts.

## 2013-02-18 NOTE — Progress Notes (Deleted)
Pt has nml 24 hour urine.  Plt are 136.  Hgb, Creatinine, ASt, ALT are nml.  BPP 8/8.  Will have pt come in

## 2013-02-18 NOTE — Addendum Note (Signed)
Addended by: Lesly Dukes on: 02/18/2013 04:37 PM   Modules accepted: Orders

## 2013-02-20 ENCOUNTER — Other Ambulatory Visit: Payer: Self-pay | Admitting: Obstetrics and Gynecology

## 2013-02-20 DIAGNOSIS — O09529 Supervision of elderly multigravida, unspecified trimester: Secondary | ICD-10-CM

## 2013-02-20 DIAGNOSIS — E119 Type 2 diabetes mellitus without complications: Secondary | ICD-10-CM

## 2013-02-21 ENCOUNTER — Ambulatory Visit (HOSPITAL_COMMUNITY)
Admission: RE | Admit: 2013-02-21 | Discharge: 2013-02-21 | Disposition: A | Discharge status: Home / Self Care | Payer: Medicaid Other | Source: Ambulatory Visit | Attending: Obstetrics and Gynecology | Admitting: Obstetrics and Gynecology

## 2013-02-21 DIAGNOSIS — O09299 Supervision of pregnancy with other poor reproductive or obstetric history, unspecified trimester: Secondary | ICD-10-CM | POA: Insufficient documentation

## 2013-02-21 DIAGNOSIS — O289 Unspecified abnormal findings on antenatal screening of mother: Secondary | ICD-10-CM | POA: Insufficient documentation

## 2013-02-21 DIAGNOSIS — O09529 Supervision of elderly multigravida, unspecified trimester: Secondary | ICD-10-CM | POA: Insufficient documentation

## 2013-02-21 DIAGNOSIS — E119 Type 2 diabetes mellitus without complications: Secondary | ICD-10-CM

## 2013-02-21 DIAGNOSIS — O24919 Unspecified diabetes mellitus in pregnancy, unspecified trimester: Secondary | ICD-10-CM | POA: Insufficient documentation

## 2013-02-26 ENCOUNTER — Ambulatory Visit (INDEPENDENT_AMBULATORY_CARE_PROVIDER_SITE_OTHER): Payer: Medicaid Other | Admitting: Obstetrics and Gynecology

## 2013-02-26 VITALS — BP 135/90 | Temp 98.1°F | Wt 165.7 lb

## 2013-02-26 DIAGNOSIS — O1400 Mild to moderate pre-eclampsia, unspecified trimester: Secondary | ICD-10-CM | POA: Insufficient documentation

## 2013-02-26 DIAGNOSIS — O3660X0 Maternal care for excessive fetal growth, unspecified trimester, not applicable or unspecified: Secondary | ICD-10-CM | POA: Insufficient documentation

## 2013-02-26 DIAGNOSIS — E119 Type 2 diabetes mellitus without complications: Secondary | ICD-10-CM

## 2013-02-26 DIAGNOSIS — O133 Gestational [pregnancy-induced] hypertension without significant proteinuria, third trimester: Secondary | ICD-10-CM

## 2013-02-26 DIAGNOSIS — O139 Gestational [pregnancy-induced] hypertension without significant proteinuria, unspecified trimester: Secondary | ICD-10-CM

## 2013-02-26 DIAGNOSIS — O3660X1 Maternal care for excessive fetal growth, unspecified trimester, fetus 1: Secondary | ICD-10-CM

## 2013-02-26 LAB — POCT URINALYSIS DIP (DEVICE)
Glucose, UA: NEGATIVE mg/dL
Hgb urine dipstick: NEGATIVE
Ketones, ur: NEGATIVE mg/dL
Protein, ur: NEGATIVE mg/dL
Specific Gravity, Urine: 1.015 (ref 1.005–1.030)
Urobilinogen, UA: 0.2 mg/dL (ref 0.0–1.0)
pH: 7 (ref 5.0–8.0)

## 2013-02-26 MED ORDER — BETAMETHASONE SOD PHOS & ACET 6 (3-3) MG/ML IJ SUSP
12.0000 mg | Freq: Once | INTRAMUSCULAR | Status: AC
  Administered 2013-02-26: 12 mg via INTRAMUSCULAR

## 2013-02-26 NOTE — Progress Notes (Signed)
Pulse- 98  Edema-feet  Pain

## 2013-02-26 NOTE — Addendum Note (Signed)
Addended by: Caren Griffins C on: 02/26/2013 10:21 AM   Modules accepted: Orders

## 2013-02-26 NOTE — Patient Instructions (Signed)

## 2013-02-26 NOTE — Progress Notes (Signed)
[place]: >90th, AC> 97th with normal AFV> start testing at 32 wks. On Glyburide 2.5 mg qhs. Fastings all 90 or below, all PPs <120 ecept 1 outlier 126. No hx CHTN. No preE sx.  Labs from last week: 24 hr pro 178; plts borderline low 136k.

## 2013-02-26 NOTE — Progress Notes (Signed)
BMZ course today

## 2013-02-26 NOTE — Progress Notes (Signed)
NST scheduled for 03/01/13.  Additional appointments for NST/BPP and OB f/u given to patient.  AVS printed and given to patient.

## 2013-02-27 ENCOUNTER — Ambulatory Visit (INDEPENDENT_AMBULATORY_CARE_PROVIDER_SITE_OTHER): Payer: Medicaid Other

## 2013-02-27 VITALS — BP 127/84 | HR 96 | Temp 98.4°F | Wt 163.9 lb

## 2013-02-27 DIAGNOSIS — O09899 Supervision of other high risk pregnancies, unspecified trimester: Secondary | ICD-10-CM

## 2013-02-27 DIAGNOSIS — O24913 Unspecified diabetes mellitus in pregnancy, third trimester: Secondary | ICD-10-CM

## 2013-02-27 DIAGNOSIS — O09213 Supervision of pregnancy with history of pre-term labor, third trimester: Secondary | ICD-10-CM

## 2013-02-27 DIAGNOSIS — O24919 Unspecified diabetes mellitus in pregnancy, unspecified trimester: Secondary | ICD-10-CM

## 2013-02-27 DIAGNOSIS — O139 Gestational [pregnancy-induced] hypertension without significant proteinuria, unspecified trimester: Secondary | ICD-10-CM

## 2013-02-27 MED ORDER — BETAMETHASONE SOD PHOS & ACET 6 (3-3) MG/ML IJ SUSP
12.0000 mg | Freq: Once | INTRAMUSCULAR | Status: AC
  Administered 2013-02-27: 12 mg via INTRAMUSCULAR

## 2013-02-27 NOTE — Progress Notes (Signed)
Discussed need for BPP on 11/28 as part of fetal testing protocol. Pt agreed and scheduled for 11/28 @ 0830

## 2013-03-01 ENCOUNTER — Ambulatory Visit (INDEPENDENT_AMBULATORY_CARE_PROVIDER_SITE_OTHER): Payer: Medicaid Other | Admitting: *Deleted

## 2013-03-01 DIAGNOSIS — O133 Gestational [pregnancy-induced] hypertension without significant proteinuria, third trimester: Secondary | ICD-10-CM

## 2013-03-01 DIAGNOSIS — O99810 Abnormal glucose complicating pregnancy: Secondary | ICD-10-CM

## 2013-03-01 DIAGNOSIS — O139 Gestational [pregnancy-induced] hypertension without significant proteinuria, unspecified trimester: Secondary | ICD-10-CM

## 2013-03-04 ENCOUNTER — Other Ambulatory Visit: Payer: Self-pay | Admitting: Obstetrics & Gynecology

## 2013-03-04 DIAGNOSIS — O24419 Gestational diabetes mellitus in pregnancy, unspecified control: Secondary | ICD-10-CM

## 2013-03-04 NOTE — Progress Notes (Signed)
EFW >90%.  Checking hgb AIC.  Needs 2x/wk testing if not already in testing.

## 2013-03-05 ENCOUNTER — Ambulatory Visit (INDEPENDENT_AMBULATORY_CARE_PROVIDER_SITE_OTHER): Payer: Medicaid Other | Admitting: Advanced Practice Midwife

## 2013-03-05 VITALS — BP 152/97 | Temp 98.2°F | Wt 164.8 lb

## 2013-03-05 DIAGNOSIS — O139 Gestational [pregnancy-induced] hypertension without significant proteinuria, unspecified trimester: Secondary | ICD-10-CM

## 2013-03-05 DIAGNOSIS — O99810 Abnormal glucose complicating pregnancy: Secondary | ICD-10-CM

## 2013-03-05 DIAGNOSIS — O133 Gestational [pregnancy-induced] hypertension without significant proteinuria, third trimester: Secondary | ICD-10-CM

## 2013-03-05 DIAGNOSIS — O09899 Supervision of other high risk pregnancies, unspecified trimester: Secondary | ICD-10-CM

## 2013-03-05 LAB — POCT URINALYSIS DIP (DEVICE)
Bilirubin Urine: NEGATIVE
Glucose, UA: NEGATIVE mg/dL
Nitrite: NEGATIVE
Urobilinogen, UA: 0.2 mg/dL (ref 0.0–1.0)

## 2013-03-05 NOTE — Progress Notes (Signed)
Doing well.  Good fetal movement, denies vaginal bleeding, LOF, regular contractions. Reviewed BS log (F: 67-112; PP 74-126, except 4 elevated glucose for 48 hours after BMZ injection, 175, 185, and 140 and 149 PP on those days only, then BS within normal range resumed).  NST-R today. BP retake 130s/80s. Denies h/a, epigastric pain, visual disturbances.

## 2013-03-05 NOTE — Progress Notes (Signed)
NST reactive on 03/01/13. 

## 2013-03-05 NOTE — Progress Notes (Signed)
Pulse- 109 Pain/pressure-lower abd   Edema-feet Pt reports having constipation Pt reports that after receiving betamethasone injections she was light headed and dizziness, loss of appetite, blood sugars elevated.  I explained to pt that the medication is steroid and that it can raise blood sugar levels but that also that was the concern for the provider and the need for collaboration with other provider.  Pt stated understanding

## 2013-03-09 ENCOUNTER — Ambulatory Visit (HOSPITAL_COMMUNITY)
Admission: RE | Admit: 2013-03-09 | Discharge: 2013-03-09 | Disposition: A | Discharge status: Home / Self Care | Payer: Medicaid Other | Source: Ambulatory Visit | Attending: Obstetrics and Gynecology | Admitting: Obstetrics and Gynecology

## 2013-03-09 ENCOUNTER — Ambulatory Visit (HOSPITAL_COMMUNITY): Admission: RE | Admit: 2013-03-09 | Payer: Medicaid Other | Source: Ambulatory Visit

## 2013-03-09 DIAGNOSIS — O09529 Supervision of elderly multigravida, unspecified trimester: Secondary | ICD-10-CM | POA: Insufficient documentation

## 2013-03-09 DIAGNOSIS — O24913 Unspecified diabetes mellitus in pregnancy, third trimester: Secondary | ICD-10-CM

## 2013-03-09 DIAGNOSIS — O24919 Unspecified diabetes mellitus in pregnancy, unspecified trimester: Secondary | ICD-10-CM | POA: Insufficient documentation

## 2013-03-12 ENCOUNTER — Ambulatory Visit (INDEPENDENT_AMBULATORY_CARE_PROVIDER_SITE_OTHER): Payer: Medicaid Other | Admitting: Obstetrics & Gynecology

## 2013-03-12 VITALS — BP 134/85 | Temp 97.9°F | Wt 167.3 lb

## 2013-03-12 DIAGNOSIS — O99810 Abnormal glucose complicating pregnancy: Secondary | ICD-10-CM

## 2013-03-12 DIAGNOSIS — O0992 Supervision of high risk pregnancy, unspecified, second trimester: Secondary | ICD-10-CM

## 2013-03-12 LAB — POCT URINALYSIS DIP (DEVICE)
Bilirubin Urine: NEGATIVE
Glucose, UA: NEGATIVE mg/dL
Ketones, ur: NEGATIVE mg/dL
Nitrite: NEGATIVE
pH: 7 (ref 5.0–8.0)

## 2013-03-12 LAB — FETAL NONSTRESS TEST

## 2013-03-12 LAB — GLUCOSE, CAPILLARY: Glucose-Capillary: 74 mg/dL (ref 70–99)

## 2013-03-12 NOTE — Progress Notes (Signed)
Fastings:All fastings <90; all pp breakfast <111; all pp lunch <120; all pp dinner 122 BP nml Pt s/p steroids for elevated BP at 32 weeks. Growth [place] 03/21/13; Continue 2x week testing. NST reactive

## 2013-03-12 NOTE — Patient Instructions (Signed)
Contraception Choices Contraception (birth control) is the use of any methods or devices to prevent pregnancy. Below are some methods to help avoid pregnancy. HORMONAL METHODS   Contraceptive implant This is a thin, plastic tube containing progesterone hormone. It does not contain estrogen hormone. Your health care provider inserts the tube in the inner part of the upper arm. The tube can remain in place for up to 3 years. After 3 years, the implant must be removed. The implant prevents the ovaries from releasing an egg (ovulation), thickens the cervical mucus to prevent sperm from entering the uterus, and thins the lining of the inside of the uterus.  Progesterone-only injections These injections are given every 3 months by your health care provider to prevent pregnancy. This synthetic progesterone hormone stops the ovaries from releasing eggs. It also thickens cervical mucus and changes the uterine lining. This makes it harder for sperm to survive in the uterus.  Birth control pills These pills contain estrogen and progesterone hormone. They work by preventing the ovaries from releasing eggs (ovulation). They also cause the cervical mucus to thicken, preventing the sperm from entering the uterus. Birth control pills are prescribed by a health care provider.Birth control pills can also be used to treat heavy periods.  Minipill This type of birth control pill contains only the progesterone hormone. They are taken every day of each month and must be prescribed by your health care provider.  Birth control patch The patch contains hormones similar to those in birth control pills. It must be changed once a week and is prescribed by a health care provider.  Vaginal ring The ring contains hormones similar to those in birth control pills. It is left in the vagina for 3 weeks, removed for 1 week, and then a new one is put back in place. The patient must be comfortable inserting and removing the ring from the  vagina.A health care provider's prescription is necessary.  Emergency contraception Emergency contraceptives prevent pregnancy after unprotected sexual intercourse. This pill can be taken right after sex or up to 5 days after unprotected sex. It is most effective the sooner you take the pills after having sexual intercourse. Most emergency contraceptive pills are available without a prescription. Check with your pharmacist. Do not use emergency contraception as your only form of birth control. BARRIER METHODS   Female condom This is a thin sheath (latex or rubber) that is worn over the penis during sexual intercourse. It can be used with spermicide to increase effectiveness.  Female condom. This is a soft, loose-fitting sheath that is put into the vagina before sexual intercourse.  Diaphragm This is a soft, latex, dome-shaped barrier that must be fitted by a health care provider. It is inserted into the vagina, along with a spermicidal jelly. It is inserted before intercourse. The diaphragm should be left in the vagina for 6 to 8 hours after intercourse.  Cervical cap This is a round, soft, latex or plastic cup that fits over the cervix and must be fitted by a health care provider. The cap can be left in place for up to 48 hours after intercourse.  Sponge This is a soft, circular piece of polyurethane foam. The sponge has spermicide in it. It is inserted into the vagina after wetting it and before sexual intercourse.  Spermicides These are chemicals that kill or block sperm from entering the cervix and uterus. They come in the form of creams, jellies, suppositories, foam, or tablets. They do not require a   prescription. They are inserted into the vagina with an applicator before having sexual intercourse. The process must be repeated every time you have sexual intercourse. INTRAUTERINE CONTRACEPTION  Intrauterine device (IUD) This is a T-shaped device that is put in a woman's uterus during a  menstrual period to prevent pregnancy. There are 2 types:  Copper IUD This type of IUD is wrapped in copper wire and is placed inside the uterus. Copper makes the uterus and fallopian tubes produce a fluid that kills sperm. It can stay in place for 10 years.  Hormone IUD This type of IUD contains the hormone progestin (synthetic progesterone). The hormone thickens the cervical mucus and prevents sperm from entering the uterus, and it also thins the uterine lining to prevent implantation of a fertilized egg. The hormone can weaken or kill the sperm that get into the uterus. It can stay in place for 3 5 years, depending on which type of IUD is used. PERMANENT METHODS OF CONTRACEPTION  Female tubal ligation This is when the woman's fallopian tubes are surgically sealed, tied, or blocked to prevent the egg from traveling to the uterus.  Hysteroscopic sterilization This involves placing a small coil or insert into each fallopian tube. Your doctor uses a technique called hysteroscopy to do the procedure. The device causes scar tissue to form. This results in permanent blockage of the fallopian tubes, so the sperm cannot fertilize the egg. It takes about 3 months after the procedure for the tubes to become blocked. You must use another form of birth control for these 3 months.  Female sterilization This is when the female has the tubes that carry sperm tied off (vasectomy).This blocks sperm from entering the vagina during sexual intercourse. After the procedure, the man can still ejaculate fluid (semen). NATURAL PLANNING METHODS  Natural family planning This is not having sexual intercourse or using a barrier method (condom, diaphragm, cervical cap) on days the woman could become pregnant.  Calendar method This is keeping track of the length of each menstrual cycle and identifying when you are fertile.  Ovulation method This is avoiding sexual intercourse during ovulation.  Symptothermal method This is  avoiding sexual intercourse during ovulation, using a thermometer and ovulation symptoms.  Post ovulation method This is timing sexual intercourse after you have ovulated. Regardless of which type or method of contraception you choose, it is important that you use condoms to protect against the transmission of sexually transmitted infections (STIs). Talk with your health care provider about which form of contraception is most appropriate for you. Document Released: 03/29/2005 Document Revised: 11/29/2012 Document Reviewed: 09/21/2012 [employer] Patient Information 2014 [employer].  

## 2013-03-12 NOTE — Progress Notes (Signed)
P =91 Edema in feet.  

## 2013-03-15 ENCOUNTER — Ambulatory Visit (INDEPENDENT_AMBULATORY_CARE_PROVIDER_SITE_OTHER): Payer: Medicaid Other | Admitting: *Deleted

## 2013-03-15 VITALS — BP 136/80

## 2013-03-15 DIAGNOSIS — O24913 Unspecified diabetes mellitus in pregnancy, third trimester: Secondary | ICD-10-CM

## 2013-03-15 DIAGNOSIS — O24919 Unspecified diabetes mellitus in pregnancy, unspecified trimester: Secondary | ICD-10-CM

## 2013-03-15 DIAGNOSIS — O133 Gestational [pregnancy-induced] hypertension without significant proteinuria, third trimester: Secondary | ICD-10-CM

## 2013-03-15 DIAGNOSIS — O139 Gestational [pregnancy-induced] hypertension without significant proteinuria, unspecified trimester: Secondary | ICD-10-CM

## 2013-03-15 NOTE — Progress Notes (Signed)
P= 63 

## 2013-03-19 ENCOUNTER — Encounter: Payer: Self-pay | Admitting: Family Medicine

## 2013-03-19 ENCOUNTER — Ambulatory Visit (INDEPENDENT_AMBULATORY_CARE_PROVIDER_SITE_OTHER): Payer: Medicaid Other | Admitting: Family Medicine

## 2013-03-19 VITALS — BP 138/90 | Temp 96.7°F | Wt 174.3 lb

## 2013-03-19 DIAGNOSIS — O24913 Unspecified diabetes mellitus in pregnancy, third trimester: Secondary | ICD-10-CM

## 2013-03-19 DIAGNOSIS — O24919 Unspecified diabetes mellitus in pregnancy, unspecified trimester: Secondary | ICD-10-CM

## 2013-03-19 LAB — POCT URINALYSIS DIP (DEVICE)
Bilirubin Urine: NEGATIVE
Glucose, UA: 250 mg/dL — AB
Ketones, ur: NEGATIVE mg/dL
Nitrite: NEGATIVE

## 2013-03-19 NOTE — Progress Notes (Signed)
P =91 Edema in feet.

## 2013-03-19 NOTE — Progress Notes (Signed)
[facility] exam with Dr. Karleen Hampshire scheduled 04/24/13 at 1015 am.

## 2013-03-19 NOTE — Progress Notes (Signed)
NST reviewed and reactive. FBS 65-103--2 of 7 out of range. 2 hour pp [phone] 4 out of 21 out of range.

## 2013-03-19 NOTE — Progress Notes (Signed)
12/4 NST reviewed and reactive. 

## 2013-03-19 NOTE — Patient Instructions (Signed)
Gestational Diabetes Mellitus Gestational diabetes mellitus, often simply referred to as gestational diabetes, is a type of diabetes that some women develop during pregnancy. In gestational diabetes, the pancreas does not make enough insulin (a hormone), the cells are less responsive to the insulin that is made (insulin resistance), or both.Normally, insulin moves sugars from food into the tissue cells. The tissue cells use the sugars for energy. The lack of insulin or the lack of normal response to insulin causes excess sugars to build up in the blood instead of going into the tissue cells. As a result, high blood sugar (hyperglycemia) develops. The effect of high sugar (glucose) levels can cause many complications.  RISK FACTORS You have an increased chance of developing gestational diabetes if you have a family history of diabetes and also have one or more of the following risk factors:  A body mass index over 30 (obesity).  A previous pregnancy with gestational diabetes.  An older age at the time of pregnancy. If blood glucose levels are kept in the normal range during pregnancy, women can have a healthy pregnancy. If your blood glucose levels are not well controlled, there may be risks to you, your unborn baby (fetus), your labor and delivery, or your newborn baby.  SYMPTOMS  If symptoms are experienced, they are much like symptoms you would normally expect during pregnancy. The symptoms of gestational diabetes include:   Increased thirst (polydipsia).  Increased urination (polyuria).  Increased urination during the night (nocturia).  Weight loss. This weight loss may be rapid.  Frequent, recurring infections.  Tiredness (fatigue).  Weakness.  Vision changes, such as blurred vision.  Fruity smell to your breath.  Abdominal pain. DIAGNOSIS Diabetes is diagnosed when blood glucose levels are increased. Your blood glucose level may be checked by one or more of the following  blood tests:  A fasting blood glucose test. You will not be allowed to eat for at least 8 hours before a blood sample is taken.  A random blood glucose test. Your blood glucose is checked at any time of the day regardless of when you ate.  A hemoglobin A1c blood glucose test. A hemoglobin A1c test provides information about blood glucose control over the previous 3 months.  An oral glucose tolerance test (OGTT). Your blood glucose is measured after you have not eaten (fasted) for 1 3 hours and then after you drink a glucose-containing beverage. Since the hormones that cause insulin resistance are highest at about 24 28 weeks of a pregnancy, an OGTT is usually performed during that time. If you have risk factors for gestational diabetes, your caregiver may test you for gestational diabetes earlier than 24 weeks of pregnancy. TREATMENT   You will need to take diabetes medicine or insulin daily to keep blood glucose levels in the desired range.  You will need to match insulin dosing with exercise and healthy food choices. The treatment goal is to maintain the before meal (preprandial), bedtime, and overnight blood glucose level at 60 99 mg/dL during pregnancy. The treatment goal is to further maintain peak after meal blood sugar (postprandial glucose) level at 100 140 mg/dL. HOME CARE INSTRUCTIONS   Have your hemoglobin A1c level checked twice a year.  Perform daily blood glucose monitoring as directed by your caregiver. It is common to perform frequent blood glucose monitoring.  Monitor urine ketones when you are ill and as directed by your caregiver.  Take your diabetes medicine and insulin as directed by your caregiver to maintain   your blood glucose level in the desired range.  Never run out of diabetes medicine or insulin. It is needed every day.  Adjust insulin based on your intake of carbohydrates. Carbohydrates can raise blood glucose levels but need to be included in your diet.  Carbohydrates provide vitamins, minerals, and fiber which are an essential part of a healthy diet. Carbohydrates are found in fruits, vegetables, whole grains, dairy products, legumes, and foods containing added sugars.    Eat healthy foods. Alternate 3 meals with 3 snacks.  Maintain a healthy weight gain. The usual total expected weight gain varies according to your prepregnancy body mass index (BMI).  Carry a medical alert card or wear your medical alert jewelry.  Carry a 15 gram carbohydrate snack with you at all times to treat low blood glucose (hypoglycemia). Some examples of 15 gram carbohydrate snacks include:  Glucose tablets, 3 or 4   Glucose gel, 15 gram tube  Raisins, 2 tablespoons (24 g)  Jelly beans, 6  Animal crackers, 8  Fruit juice, regular soda, or low fat milk, 4 ounces (120 mL)  Gummy treats, 9    Recognize hypoglycemia. Hypoglycemia during pregnancy occurs with blood glucose levels of 60 mg/dL and below. The risk for hypoglycemia increases when fasting or skipping meals, during or after intense exercise, and during sleep. Hypoglycemia symptoms can include:  Tremors or shakes.  Decreased ability to concentrate.  Sweating.  Increased heart rate.  Headache.  Dry mouth.  Hunger.  Irritability.  Anxiety.  Restless sleep.  Altered speech or coordination.  Confusion.  Treat hypoglycemia promptly. If you are alert and able to safely swallow, follow the 15:15 rule:  Take 15 20 grams of rapid-acting glucose or carbohydrate. Rapid-acting options include glucose gel, glucose tablets, or 4 ounces (120 mL) of fruit juice, regular soda, or low fat milk.  Check your blood glucose level 15 minutes after taking the glucose.   Take 15 20 grams more of glucose if the repeat blood glucose level is still 70 mg/dL or below.  Eat a meal or snack within 1 hour once blood glucose levels return to normal.  Be alert to polyuria and polydipsia which are early  signs of hyperglycemia. An early awareness of hyperglycemia allows for prompt treatment. Treat hyperglycemia as directed by your caregiver.  Engage in at least 30 minutes of physical activity a day or as directed by your caregiver. Ten minutes of physical activity timed 30 minutes after each meal is encouraged to control postprandial blood glucose levels.  Adjust your insulin dosing and food intake as needed if you start a new exercise or sport.  Follow your sick day plan at any time you are unable to eat or drink as usual.  Avoid tobacco and alcohol use.  Follow up with your caregiver regularly.  Follow the advice of your caregiver regarding your prenatal and post-delivery (postpartum) appointments, meal planning, exercise, medicines, vitamins, blood tests, other medical tests, and physical activities.  Perform daily skin and foot care. Examine your skin and feet daily for cuts, bruises, redness, nail problems, bleeding, blisters, or sores.  Brush your teeth and gums at least twice a day and floss at least once a day. Follow up with your dentist regularly.  Schedule an eye exam during the first trimester of your pregnancy or as directed by your caregiver.  Share your diabetes management plan with your workplace or school.  Stay up-to-date with immunizations.  Learn to manage stress.  Obtain ongoing diabetes education and   support as needed. SEEK MEDICAL CARE IF:   You are unable to eat food or drink fluids for more than 6 hours.  You have nausea and vomiting for more than 6 hours.  You have a blood glucose level of 200 mg/dL and you have ketones in your urine.  There is a change in mental status.  You develop vision problems.  You have a persistent headache.  You have upper abdominal pain or discomfort.  You develop an additional serious illness.  You have diarrhea for more than 6 hours.  You have been sick or have had a fever for a couple of days and are not getting  better. SEEK IMMEDIATE MEDICAL CARE IF:   You have difficulty breathing.  You no longer feel the baby moving.  You are bleeding or have discharge from your vagina.  You start having premature contractions or labor. MAKE SURE YOU:  Understand these instructions.  Will watch your condition.  Will get help right away if you are not doing well or get worse. Document Released: 07/05/2000 Document Revised: 07/24/2012 Document Reviewed: 10/26/2011 [employer] Patient Information 2014 [employer]  Third Trimester of Pregnancy The third trimester is from week 29 through week 42, months 7 through 9. The third trimester is a time when the fetus is growing rapidly. At the end of the ninth month, the fetus is about 20 inches in length and weighs 6 10 pounds.  BODY CHANGES Your body goes through many changes during pregnancy. The changes vary from woman to woman.   Your weight will continue to increase. You can expect to gain 25 35 pounds (11 16 kg) by the end of the pregnancy.  You may begin to get stretch marks on your hips, abdomen, and breasts.  You may urinate more often because the fetus is moving lower into your pelvis and pressing on your bladder.  You may develop or continue to have heartburn as a result of your pregnancy.  You may develop constipation because certain hormones are causing the muscles that push waste through your intestines to slow down.  You may develop hemorrhoids or swollen, bulging veins (varicose veins).  You may have pelvic pain because of the weight gain and pregnancy hormones relaxing your joints between the bones in your pelvis. Back aches may result from over exertion of the muscles supporting your posture.  Your breasts will continue to grow and be tender. A yellow discharge may leak from your breasts called colostrum.  Your belly button may stick out.  You may feel short of breath because of your expanding uterus.  You may notice the fetus  "dropping," or moving lower in your abdomen.  You may have a bloody mucus discharge. This usually occurs a few days to a week before labor begins.  Your cervix becomes thin and soft (effaced) near your due date. WHAT TO EXPECT AT YOUR PRENATAL EXAMS  You will have prenatal exams every 2 weeks until week 36. Then, you will have weekly prenatal exams. During a routine prenatal visit:  You will be weighed to make sure you and the fetus are growing normally.  Your blood pressure is taken.  Your abdomen will be measured to track your baby's growth.  The fetal heartbeat will be listened to.  Any test results from the previous visit will be discussed.  You may have a cervical check near your due date to see if you have effaced. At around 36 weeks, your caregiver will check your cervix. At   the same time, your caregiver will also perform a test on the secretions of the vaginal tissue. This test is to determine if a type of bacteria, Group B streptococcus, is present. Your caregiver will explain this further. Your caregiver may ask you:  What your birth plan is.  How you are feeling.  If you are feeling the baby move.  If you have had any abnormal symptoms, such as leaking fluid, bleeding, severe headaches, or abdominal cramping.  If you have any questions. Other tests or screenings that may be performed during your third trimester include:  Blood tests that check for low iron levels (anemia).  Fetal testing to check the health, activity level, and growth of the fetus. Testing is done if you have certain medical conditions or if there are problems during the pregnancy. FALSE LABOR You may feel small, irregular contractions that eventually go away. These are called Braxton Hicks contractions, or false labor. Contractions may last for hours, days, or even weeks before true labor sets in. If contractions come at regular intervals, intensify, or become painful, it is best to be seen by your  caregiver.  SIGNS OF LABOR   Menstrual-like cramps.  Contractions that are 5 minutes apart or less.  Contractions that start on the top of the uterus and spread down to the lower abdomen and back.  A sense of increased pelvic pressure or back pain.  A watery or bloody mucus discharge that comes from the vagina. If you have any of these signs before the 37th week of pregnancy, call your caregiver right away. You need to go to the hospital to get checked immediately. HOME CARE INSTRUCTIONS   Avoid all smoking, herbs, alcohol, and unprescribed drugs. These chemicals affect the formation and growth of the baby.  Follow your caregiver's instructions regarding medicine use. There are medicines that are either safe or unsafe to take during pregnancy.  Exercise only as directed by your caregiver. Experiencing uterine cramps is a good sign to stop exercising.  Continue to eat regular, healthy meals.  Wear a good support bra for breast tenderness.  Do not use hot tubs, steam rooms, or saunas.  Wear your seat belt at all times when driving.  Avoid raw meat, uncooked cheese, cat litter boxes, and soil used by cats. These carry germs that can cause birth defects in the baby.  Take your prenatal vitamins.  Try taking a stool softener (if your caregiver approves) if you develop constipation. Eat more high-fiber foods, such as fresh vegetables or fruit and whole grains. Drink plenty of fluids to keep your urine clear or pale yellow.  Take warm sitz baths to soothe any pain or discomfort caused by hemorrhoids. Use hemorrhoid cream if your caregiver approves.  If you develop varicose veins, wear support hose. Elevate your feet for 15 minutes, 3 4 times a day. Limit salt in your diet.  Avoid heavy lifting, wear low heal shoes, and practice good posture.  Rest a lot with your legs elevated if you have leg cramps or low back pain.  Visit your dentist if you have not gone during your pregnancy.  Use a soft toothbrush to brush your teeth and be gentle when you floss.  A sexual relationship may be continued unless your caregiver directs you otherwise.  Do not travel far distances unless it is absolutely necessary and only with the approval of your caregiver.  Take prenatal classes to understand, practice, and ask questions about the labor and delivery.  Make   a trial run to the hospital.  Pack your hospital bag.  Prepare the baby's nursery.  Continue to go to all your prenatal visits as directed by your caregiver. SEEK MEDICAL CARE IF:  You are unsure if you are in labor or if your water has broken.  You have dizziness.  You have mild pelvic cramps, pelvic pressure, or nagging pain in your abdominal area.  You have persistent nausea, vomiting, or diarrhea.  You have a bad smelling vaginal discharge.  You have pain with urination. SEEK IMMEDIATE MEDICAL CARE IF:   You have a fever.  You are leaking fluid from your vagina.  You have spotting or bleeding from your vagina.  You have severe abdominal cramping or pain.  You have rapid weight loss or gain.  You have shortness of breath with chest pain.  You notice sudden or extreme swelling of your face, hands, ankles, feet, or legs.  You have not felt your baby move in over an hour.  You have severe headaches that do not go away with medicine.  You have vision changes. Document Released: 03/23/2001 Document Revised: 11/29/2012 Document Reviewed: 05/30/2012 [employer] Patient Information 2014 [employer].  Breastfeeding Deciding to breastfeed is one of the best choices you can make for you and your baby. A change in hormones during pregnancy causes your breast tissue to grow and increases the number and size of your milk ducts. These hormones also allow proteins, sugars, and fats from your blood supply to make breast milk in your milk-producing glands. Hormones prevent breast milk from being released before your  baby is born as well as prompt milk flow after birth. Once breastfeeding has begun, thoughts of your baby, as well as his or her sucking or crying, can stimulate the release of milk from your milk-producing glands.  BENEFITS OF BREASTFEEDING For Your Baby  Your first milk (colostrum) helps your baby's digestive system function better.   There are antibodies in your milk that help your baby fight off infections.   Your baby has a lower incidence of asthma, allergies, and sudden infant death syndrome.   The nutrients in breast milk are better for your baby than infant formulas and are designed uniquely for your baby's needs.   Breast milk improves your baby's brain development.   Your baby is less likely to develop other conditions, such as childhood obesity, asthma, or type 2 diabetes mellitus.  For You   Breastfeeding helps to create a very special bond between you and your baby.   Breastfeeding is convenient. Breast milk is always available at the correct temperature and costs nothing.   Breastfeeding helps to burn calories and helps you lose the weight gained during pregnancy.   Breastfeeding makes your uterus contract to its prepregnancy size faster and slows bleeding (lochia) after you give birth.   Breastfeeding helps to lower your risk of developing type 2 diabetes mellitus, osteoporosis, and breast or ovarian cancer later in life. SIGNS THAT YOUR BABY IS HUNGRY Early Signs of Hunger  Increased alertness or activity.  Stretching.  Movement of the head from side to side.  Movement of the head and opening of the mouth when the corner of the mouth or cheek is stroked (rooting).  Increased sucking sounds, smacking lips, cooing, sighing, or squeaking.  Hand-to-mouth movements.  Increased sucking of fingers or hands. Late Signs of Hunger  Fussing.  Intermittent crying. Extreme Signs of Hunger Signs of extreme hunger will require calming and consoling before  your   baby will be able to breastfeed successfully. Do not wait for the following signs of extreme hunger to occur before you initiate breastfeeding:   Restlessness.  A loud, strong cry.   Screaming. BREASTFEEDING BASICS Breastfeeding Initiation  Find a comfortable place to sit or lie down, with your neck and back well supported.  Place a pillow or rolled up blanket under your baby to bring him or her to the level of your breast (if you are seated). Nursing pillows are specially designed to help support your arms and your baby while you breastfeed.  Make sure that your baby's abdomen is facing your abdomen.   Gently massage your breast. With your fingertips, massage from your chest wall toward your nipple in a circular motion. This encourages milk flow. You may need to continue this action during the feeding if your milk flows slowly.  Support your breast with 4 fingers underneath and your thumb above your nipple. Make sure your fingers are well away from your nipple and your baby's mouth.   Stroke your baby's lips gently with your finger or nipple.   When your baby's mouth is open wide enough, quickly bring your baby to your breast, placing your entire nipple and as much of the colored area around your nipple (areola) as possible into your baby's mouth.   More areola should be visible above your baby's upper lip than below the lower lip.   Your baby's tongue should be between his or her lower gum and your breast.   Ensure that your baby's mouth is correctly positioned around your nipple (latched). Your baby's lips should create a seal on your breast and be turned out (everted).  It is common for your baby to suck about 2 3 minutes in order to start the flow of breast milk. Latching Teaching your baby how to latch on to your breast properly is very important. An improper latch can cause nipple pain and decreased milk supply for you and poor weight gain in your baby. Also, if  your baby is not latched onto your nipple properly, he or she may swallow some air during feeding. This can make your baby fussy. Burping your baby when you switch breasts during the feeding can help to get rid of the air. However, teaching your baby to latch on properly is still the best way to prevent fussiness from swallowing air while breastfeeding. Signs that your baby has successfully latched on to your nipple:    Silent tugging or silent sucking, without causing you pain.   Swallowing heard between every 3 4 sucks.    Muscle movement above and in front of his or her ears while sucking.  Signs that your baby has not successfully latched on to nipple:   Sucking sounds or smacking sounds from your baby while breastfeeding.  Nipple pain. If you think your baby has not latched on correctly, slip your finger into the corner of your baby's mouth to break the suction and place it between your baby's gums. Attempt breastfeeding initiation again. Signs of Successful Breastfeeding Signs from your baby:   A gradual decrease in the number of sucks or complete cessation of sucking.   Falling asleep.   Relaxation of his or her body.   Retention of a small amount of milk in his or her mouth.   Letting go of your breast by himself or herself. Signs from you:  Breasts that have increased in firmness, weight, and size 1 3 hours after feeding.     Breasts that are softer immediately after breastfeeding.  Increased milk volume, as well as a change in milk consistency and color by the 5th day of breastfeeding.   Nipples that are not sore, cracked, or bleeding. Signs That Your Baby is Getting Enough Milk  Wetting at least 3 diapers in a 24-hour period. The urine should be clear and pale yellow by age 5 days.  At least 3 stools in a 24-hour period by age 5 days. The stool should be soft and yellow.  At least 3 stools in a 24-hour period by age 7 days. The stool should be seedy and  yellow.  No loss of weight greater than 10% of birth weight during the first 3 days of age.  Average weight gain of 4 7 ounces (120 210 mL) per week after age 4 days.  Consistent daily weight gain by age 5 days, without weight loss after the age of 2 weeks. After a feeding, your baby may spit up a small amount. This is common. BREASTFEEDING FREQUENCY AND DURATION Frequent feeding will help you make more milk and can prevent sore nipples and breast engorgement. Breastfeed when you feel the need to reduce the fullness of your breasts or when your baby shows signs of hunger. This is called "breastfeeding on demand." Avoid introducing a pacifier to your baby while you are working to establish breastfeeding (the first 4 6 weeks after your baby is born). After this time you may choose to use a pacifier. Research has shown that pacifier use during the first year of a baby's life decreases the risk of sudden infant death syndrome (SIDS). Allow your baby to feed on each breast as long as he or she wants. Breastfeed until your baby is finished feeding. When your baby unlatches or falls asleep while feeding from the first breast, offer the second breast. Because newborns are often sleepy in the first few weeks of life, you may need to awaken your baby to get him or her to feed. Breastfeeding times will vary from baby to baby. However, the following rules can serve as a guide to help you ensure that your baby is properly fed:  Newborns (babies 4 weeks of age or younger) may breastfeed every 1 3 hours.  Newborns should not go longer than 3 hours during the day or 5 hours during the night without breastfeeding.  You should breastfeed your baby a minimum of 8 times in a 24-hour period until you begin to introduce solid foods to your baby at around 6 months of age. BREAST MILK PUMPING Pumping and storing breast milk allows you to ensure that your baby is exclusively fed your breast milk, even at times when you  are unable to breastfeed. This is especially important if you are going back to work while you are still breastfeeding or when you are not able to be present during feedings. Your lactation consultant can give you guidelines on how long it is safe to store breast milk.  A breast pump is a machine that allows you to pump milk from your breast into a sterile bottle. The pumped breast milk can then be stored in a refrigerator or freezer. Some breast pumps are operated by hand, while others use electricity. Ask your lactation consultant which type will work best for you. Breast pumps can be purchased, but some hospitals and breastfeeding support groups lease breast pumps on a monthly basis. A lactation consultant can teach you how to hand express breast milk, if you   prefer not to use a pump.  CARING FOR YOUR BREASTS WHILE YOU BREASTFEED Nipples can become dry, cracked, and sore while breastfeeding. The following recommendations can help keep your breasts moisturized and healthy:  Avoid using soap on your nipples.   Wear a supportive bra. Although not required, special nursing bras and tank tops are designed to allow access to your breasts for breastfeeding without taking off your entire bra or top. Avoid wearing underwire style bras or extremely tight bras.  Air dry your nipples for 3 4minutes after each feeding.   Use only cotton bra pads to absorb leaked breast milk. Leaking of breast milk between feedings is normal.   Use lanolin on your nipples after breastfeeding. Lanolin helps to maintain your skin's normal moisture barrier. If you use pure lanolin you do not need to wash it off before feeding your baby again. Pure lanolin is not toxic to your baby. You may also hand express a few drops of breast milk and gently massage that milk into your nipples and allow the milk to air dry. In the first few weeks after giving birth, some women experience extremely full breasts (engorgement). Engorgement can  make your breasts feel heavy, warm, and tender to the touch. Engorgement peaks within 3 5 days after you give birth. The following recommendations can help ease engorgement:  Completely empty your breasts while breastfeeding or pumping. You may want to start by applying warm, moist heat (in the shower or with warm water-soaked hand towels) just before feeding or pumping. This increases circulation and helps the milk flow. If your baby does not completely empty your breasts while breastfeeding, pump any extra milk after he or she is finished.  Wear a snug bra (nursing or regular) or tank top for 1 2 days to signal your body to slightly decrease milk production.  Apply ice packs to your breasts, unless this is too uncomfortable for you.  Make sure that your baby is latched on and positioned properly while breastfeeding. If engorgement persists after 48 hours of following these recommendations, contact your health care provider or a lactation consultant. OVERALL HEALTH CARE RECOMMENDATIONS WHILE BREASTFEEDING  Eat healthy foods. Alternate between meals and snacks, eating 3 of each per day. Because what you eat affects your breast milk, some of the foods may make your baby more irritable than usual. Avoid eating these foods if you are sure that they are negatively affecting your baby.  Drink milk, fruit juice, and water to satisfy your thirst (about 10 glasses a day).   Rest often, relax, and continue to take your prenatal vitamins to prevent fatigue, stress, and anemia.  Continue breast self-awareness checks.  Avoid chewing and smoking tobacco.  Avoid alcohol and drug use. Some medicines that may be harmful to your baby can pass through breast milk. It is important to ask your health care provider before taking any medicine, including all over-the-counter and prescription medicine as well as vitamin and herbal supplements. It is possible to become pregnant while breastfeeding. If birth control  is desired, ask your health care provider about options that will be safe for your baby. SEEK MEDICAL CARE IF:   You feel like you want to stop breastfeeding or have become frustrated with breastfeeding.  You have painful breasts or nipples.  Your nipples are cracked or bleeding.  Your breasts are red, tender, or warm.  You have a swollen area on either breast.  You have a fever or chills.  You have nausea   or vomiting.  You have drainage other than breast milk from your nipples.  Your breasts do not become full before feedings by the 5th day after you give birth.  You feel sad and depressed.  Your baby is too sleepy to eat well.  Your baby is having trouble sleeping.   Your baby is wetting less than 3 diapers in a 24-hour period.  Your baby has less than 3 stools in a 24-hour period.  Your baby's skin or the white part of his or her eyes becomes yellow.   Your baby is not gaining weight by 5 days of age. SEEK IMMEDIATE MEDICAL CARE IF:   Your baby is overly tired (lethargic) and does not want to wake up and feed.  Your baby develops an unexplained fever. Document Released: 03/29/2005 Document Revised: 11/29/2012 Document Reviewed: 09/20/2012 [employer] Patient Information 2014 [employer].  

## 2013-03-20 ENCOUNTER — Other Ambulatory Visit: Payer: Self-pay | Admitting: Obstetrics and Gynecology

## 2013-03-20 DIAGNOSIS — O24119 Pre-existing diabetes mellitus, type 2, in pregnancy, unspecified trimester: Secondary | ICD-10-CM

## 2013-03-21 ENCOUNTER — Ambulatory Visit (HOSPITAL_COMMUNITY)
Admission: RE | Admit: 2013-03-21 | Discharge: 2013-03-21 | Disposition: A | Discharge status: Home / Self Care | Payer: Medicaid Other | Source: Ambulatory Visit | Attending: Obstetrics and Gynecology | Admitting: Obstetrics and Gynecology

## 2013-03-21 ENCOUNTER — Ambulatory Visit (HOSPITAL_COMMUNITY)
Admission: RE | Admit: 2013-03-21 | Discharge: 2013-03-21 | Disposition: A | Discharge status: Home / Self Care | Payer: Medicaid Other | Source: Ambulatory Visit | Attending: Family Medicine | Admitting: Family Medicine

## 2013-03-21 DIAGNOSIS — O09529 Supervision of elderly multigravida, unspecified trimester: Secondary | ICD-10-CM | POA: Insufficient documentation

## 2013-03-21 DIAGNOSIS — O24119 Pre-existing diabetes mellitus, type 2, in pregnancy, unspecified trimester: Secondary | ICD-10-CM

## 2013-03-21 DIAGNOSIS — O139 Gestational [pregnancy-induced] hypertension without significant proteinuria, unspecified trimester: Secondary | ICD-10-CM | POA: Insufficient documentation

## 2013-03-21 DIAGNOSIS — O09299 Supervision of pregnancy with other poor reproductive or obstetric history, unspecified trimester: Secondary | ICD-10-CM | POA: Insufficient documentation

## 2013-03-21 DIAGNOSIS — O289 Unspecified abnormal findings on antenatal screening of mother: Secondary | ICD-10-CM | POA: Insufficient documentation

## 2013-03-21 DIAGNOSIS — O24919 Unspecified diabetes mellitus in pregnancy, unspecified trimester: Secondary | ICD-10-CM | POA: Insufficient documentation

## 2013-03-21 LAB — HEMOGLOBIN A1C
Hgb A1c MFr Bld: 7.6 % — ABNORMAL HIGH (ref <5.7)
Mean Plasma Glucose: 171 mg/dL — ABNORMAL HIGH (ref <117)

## 2013-03-21 NOTE — Progress Notes (Signed)
IUP at 33+4 weeks Normal interval anatomy; anatomic survey complete Mild polyhydramnios EFW > 90th %tile; 3382 grams; 7+7; fetus at risk to be LGA/macrosomic   BPP 8/8  HgbA1c was drawn today; BP 149/88  Continue twice weekly NSTs with weekly AFIs   Follow-up ultrasound for growth in 4 weeks (EFW prior to delivery)

## 2013-03-22 ENCOUNTER — Ambulatory Visit (INDEPENDENT_AMBULATORY_CARE_PROVIDER_SITE_OTHER): Payer: Medicaid Other | Admitting: *Deleted

## 2013-03-22 VITALS — BP 141/79

## 2013-03-22 DIAGNOSIS — O24913 Unspecified diabetes mellitus in pregnancy, third trimester: Secondary | ICD-10-CM

## 2013-03-22 DIAGNOSIS — O24919 Unspecified diabetes mellitus in pregnancy, unspecified trimester: Secondary | ICD-10-CM

## 2013-03-22 NOTE — Progress Notes (Signed)
P = 80   [place] @ MFM yesterday,  EFW - >90%, AFI - 25.66 cm, Hgb A1c was drawn = 7.6. Next [place] for growth scheduled on 04/19/13

## 2013-03-23 ENCOUNTER — Encounter: Payer: Self-pay | Admitting: *Deleted

## 2013-03-26 ENCOUNTER — Ambulatory Visit (INDEPENDENT_AMBULATORY_CARE_PROVIDER_SITE_OTHER): Payer: Medicaid Other | Admitting: Obstetrics & Gynecology

## 2013-03-26 VITALS — BP 125/80 | Temp 97.3°F | Wt 180.0 lb

## 2013-03-26 DIAGNOSIS — O24913 Unspecified diabetes mellitus in pregnancy, third trimester: Secondary | ICD-10-CM

## 2013-03-26 DIAGNOSIS — O24919 Unspecified diabetes mellitus in pregnancy, unspecified trimester: Secondary | ICD-10-CM

## 2013-03-26 DIAGNOSIS — O99810 Abnormal glucose complicating pregnancy: Secondary | ICD-10-CM

## 2013-03-26 LAB — POCT URINALYSIS DIP (DEVICE)
Ketones, ur: NEGATIVE mg/dL
Protein, ur: 30 mg/dL — AB
Urobilinogen, UA: 0.2 mg/dL (ref 0.0–1.0)
pH: 7 (ref 5.0–8.0)

## 2013-03-26 MED ORDER — GLYBURIDE 5 MG PO TABS: ORAL_TABLET | ORAL | Status: DC

## 2013-03-26 NOTE — Progress Notes (Signed)
P=87  Pt c/o swelling in entire legs, reports braxton hicks contractions

## 2013-03-26 NOTE — Progress Notes (Signed)
Reactive NST.  Fastings 57-84;  2 hr br 16,109,60,45,409,81,191,47.  2 hr lu 95,85,67,88,76,74,62,100;  2 hr din182,69,85,120,79,71,90,106  Pt feeling symptoms of hypoglycemia in 50s and 60.  Will decrease to 1 pill bid glyburide qhs.

## 2013-03-29 ENCOUNTER — Ambulatory Visit (INDEPENDENT_AMBULATORY_CARE_PROVIDER_SITE_OTHER): Payer: Medicaid Other | Admitting: *Deleted

## 2013-03-29 VITALS — BP 148/85 | Wt 182.2 lb

## 2013-03-29 DIAGNOSIS — O24913 Unspecified diabetes mellitus in pregnancy, third trimester: Secondary | ICD-10-CM

## 2013-03-29 DIAGNOSIS — O24919 Unspecified diabetes mellitus in pregnancy, unspecified trimester: Secondary | ICD-10-CM

## 2013-03-29 LAB — POCT URINALYSIS DIP (DEVICE)
Hgb urine dipstick: NEGATIVE
Leukocytes, UA: NEGATIVE
Nitrite: NEGATIVE
Protein, ur: NEGATIVE mg/dL
Specific Gravity, Urine: 1.015 (ref 1.005–1.030)
Urobilinogen, UA: 0.2 mg/dL (ref 0.0–1.0)

## 2013-03-29 NOTE — Progress Notes (Signed)
P = 78     Pt denies H/A or visual changes.   Discussed BP values and pt status with Dr. Debroah Loop in clinic.  Follow up on 12/22 as scheduled.  Pre-eclampsia sx reviewed with pt.

## 2013-03-30 NOTE — Progress Notes (Signed)
Reactive NST 03/29/13

## 2013-04-02 ENCOUNTER — Ambulatory Visit (INDEPENDENT_AMBULATORY_CARE_PROVIDER_SITE_OTHER): Payer: Medicaid Other | Admitting: Obstetrics & Gynecology

## 2013-04-02 VITALS — BP 158/90 | Wt 185.9 lb

## 2013-04-02 DIAGNOSIS — O409XX0 Polyhydramnios, unspecified trimester, not applicable or unspecified: Secondary | ICD-10-CM

## 2013-04-02 DIAGNOSIS — O3663X1 Maternal care for excessive fetal growth, third trimester, fetus 1: Secondary | ICD-10-CM

## 2013-04-02 DIAGNOSIS — O403XX0 Polyhydramnios, third trimester, not applicable or unspecified: Secondary | ICD-10-CM | POA: Insufficient documentation

## 2013-04-02 DIAGNOSIS — O1403 Mild to moderate pre-eclampsia, third trimester: Secondary | ICD-10-CM

## 2013-04-02 DIAGNOSIS — O3660X0 Maternal care for excessive fetal growth, unspecified trimester, not applicable or unspecified: Secondary | ICD-10-CM

## 2013-04-02 DIAGNOSIS — IMO0002 Reserved for concepts with insufficient information to code with codable children: Secondary | ICD-10-CM

## 2013-04-02 DIAGNOSIS — O403XX1 Polyhydramnios, third trimester, fetus 1: Secondary | ICD-10-CM

## 2013-04-02 DIAGNOSIS — O24913 Unspecified diabetes mellitus in pregnancy, third trimester: Secondary | ICD-10-CM

## 2013-04-02 DIAGNOSIS — O24919 Unspecified diabetes mellitus in pregnancy, unspecified trimester: Secondary | ICD-10-CM

## 2013-04-02 LAB — OB RESULTS CONSOLE GC/CHLAMYDIA
Chlamydia: NEGATIVE
Gonorrhea: NEGATIVE

## 2013-04-02 LAB — COMPREHENSIVE METABOLIC PANEL
ALT: 9 U/L (ref 0–35)
AST: 17 U/L (ref 0–37)
Alkaline Phosphatase: 155 U/L — ABNORMAL HIGH (ref 39–117)
BUN: 9 mg/dL (ref 6–23)
Potassium: 3.3 mEq/L — ABNORMAL LOW (ref 3.5–5.3)
Sodium: 142 mEq/L (ref 135–145)
Total Bilirubin: 0.3 mg/dL (ref 0.3–1.2)
Total Protein: 5.9 g/dL — ABNORMAL LOW (ref 6.0–8.3)

## 2013-04-02 LAB — CBC
HCT: 36.5 % (ref 36.0–46.0)
Hemoglobin: 12.2 g/dL (ref 12.0–15.0)
MCH: 28 pg (ref 26.0–34.0)
MCHC: 33.4 g/dL (ref 30.0–36.0)
Platelets: 113 10*3/uL — ABNORMAL LOW (ref 150–400)
WBC: 6.7 10*3/uL (ref 4.0–10.5)

## 2013-04-02 LAB — POCT URINALYSIS DIP (DEVICE)
Bilirubin Urine: NEGATIVE
Glucose, UA: NEGATIVE mg/dL
Nitrite: NEGATIVE
Urobilinogen, UA: 0.2 mg/dL (ref 0.0–1.0)
pH: 7 (ref 5.0–8.0)

## 2013-04-02 NOTE — Patient Instructions (Addendum)
Preeclampsia and Eclampsia Preeclampsia is a condition of high blood pressure during pregnancy. It can happen at 20 weeks or later in pregnancy. If high blood pressure occurs in the second half of pregnancy with no other symptoms, it is called gestational hypertension and goes away after the baby is born. If any of the symptoms listed below develop with gestational hypertension, it is then called preeclampsia. Eclampsia (convulsions) may follow preeclampsia. This is one of the reasons for regular prenatal checkups. Early diagnosis and treatment are very important to prevent eclampsia. CAUSES  There is no known cause of preeclampsia/eclampsia in pregnancy. There are several known conditions that may put the pregnant woman at risk, such as:  The first pregnancy.  Having preeclampsia in a past pregnancy.  Having lasting (chronic) high blood pressure.  Having multiples (twins, triplets).  Being age 35 or older.  African American ethnic background.  Having kidney disease or diabetes.  Medical conditions such as lupus or blood diseases.  Being overweight (obese). SYMPTOMS   High blood pressure.  Headaches.  Sudden weight gain.  Swelling of hands, face, legs, and feet.  Protein in the urine.  Feeling sick to your stomach (nauseous) and throwing up (vomiting).  Vision problems (blurred or double vision).  Numbness in the face, arms, legs, and feet.  Dizziness.  Slurred speech.  Preeclampsia can cause growth retardation in the fetus.  Separation (abruption) of the placenta.  Not enough fluid in the amniotic sac (oligohydramnios).  Sensitivity to bright lights.  Belly (abdominal) pain. DIAGNOSIS  If protein is found in the urine in the second half of pregnancy, this is considered preeclampsia. Other symptoms mentioned above may also be present. TREATMENT  It is necessary to treat this.  Your caregiver may prescribe bed rest early in this condition. Plenty of rest and  salt restriction may be all that is needed.  Medicines may be necessary to lower blood pressure if the condition does not respond to more conservative measures.  In more severe cases, hospitalization may be needed:  For treatment of blood pressure.  To control fluid retention.  To monitor the baby to see if the condition is causing harm to the baby.  Hospitalization is the best way to treat the first sign of preeclampsia. This is so the mother and baby can be watched closely and blood tests can be done effectively and correctly.  If the condition becomes severe, it may be necessary to induce labor or to remove the infant by surgical means (cesarean section). The best cure for preeclampsia/eclampsia is to deliver the baby. Preeclampsia and eclampsia involve risks to mother and infant. Your caregiver will discuss these risks with you. Together, you can work out the best possible approach to your problems. Make sure you keep your prenatal visits as scheduled. Not keeping appointments could result in a chronic or permanent injury, pain, disability to you, and death or injury to you or your unborn baby. If there is any problem keeping the appointment, you must call to reschedule. HOME CARE INSTRUCTIONS   Keep your prenatal appointments and tests as scheduled.  Tell your caregiver if you have any of the above risk factors.  Get plenty of rest and sleep.  Eat a balanced diet that is low in salt, and do not add salt to your food.  Avoid stressful situations.  Only take over-the-counter and prescriptions medicines for pain, discomfort, or fever as directed by your caregiver. SEEK IMMEDIATE MEDICAL CARE IF:   You develop severe swelling   anywhere in the body. This usually occurs in the legs.  You gain 05 lb/2.3 kg or more in a week.  You develop a severe headache, dizziness, problems with your vision, or confusion.  You have abdominal pain, nausea, or vomiting.  You have a seizure.  You  have trouble moving any part of your body, or you develop numbness or problems speaking.  You have bruising or abnormal bleeding from anywhere in the body.  You develop a stiff neck.  You pass out. MAKE SURE YOU:   Understand these instructions.  Will watch your condition.  Will get help right away if you are not doing well or get worse. Document Released: 03/26/2000 Document Revised: 06/21/2011 Document Reviewed: 11/10/2007 [facility] Patient Information 2014 [place], [place].  Preeclampsia y eclampsia (Preeclampsia and Eclampsia) La preeclampsia es un trastorno por el cual hay un aumento de la presin arterial durante el embarazo. Ocurre despus de la 20a. semana de gestacin. Si se produce durante la segunda mitad del Psychiatrist, y no hay otros sntomas, se denomina hipertensin gestacional y desaparece luego que el beb nace. Si junto a la hipertensin gestacional se desarrolla alguno de los sntomas que se enumeran ms abajo, el trastorno se denomina preeclampsia. La eclampsia (convulsiones) puede seguir a la preeclampsia. sta es una de las razones por las que deben realizarse controles prenatales. Es muy importante realizar un diagnstico y un tratamiento precoz para la prevencin. CAUSAS No existe una causa conocida para este problema. Existen algunos problemas que pueden poner a la mujer embarazada en riesgo de sufrirlo. Ellos son:  Engineer, agricultural.  Haber sufrido preeclampsia en embarazos anteriores.  Sufrir hipertensin crnica.  Si se trata de un embarazo mltiple (mellizos, trillizos).  Tener 35 aos o ms.  Pertenecer a la Public affairs consultant.  Sufrir problemas renales o ser diabtica.  Sufrir enfermedades como lupus o problemas sanguneos.  Tener sobrepeso (ser Maryjane Hurter). SNTOMAS  Presin arterial elevada.  Dolor de cabeza  Aumento de peso sbito.  Hinchazn de manos, rostro, piernas y pies  Protenas en la orina.  Nuseas y vmitos  Problemas visuales  (visin doble o borrosa).  Adormecimiento del rostro, brazos, piernas y pies.  Mareos.  Habla arrastrando las palabras.  La preeclampsia puede causar un retraso en la maduracin del feto.  Separacin (abrupcin) de la placenta.  No hay suficiente lquido en el saco amnitico (oligohidramnios).  Sensibilidad a la luz brillante.  Dolor abdominal. DIAGNSTICO Si se descubren protenas en la orina durante la segunda mitad del [place], esto se considera preeclampsia. Tambin puede haber otros de los sntomas ya mencionados. TRATAMIENTO Es necesario Pensions consultant.   El profesional que la asiste podr prescribirle reposo en cama en las primeras etapas de la enfermedad. Mucho reposo y restriccin de sal puede ser todo lo que necesite.  Si no responde a los tratamientos ms conservadores, podr ser necesaria la administracin de medicamentos.  En los casos graves, podr ser necesaria la hospitalizacin.  Para tratar la hipertensin.  Para controlar la retencin de lquidos.  Para verificar que el beb no sufra ningn dao.  La hospitalizacin es el mejor modo de tratar los primeros signos de preeclampsia. [employer], la mam y el beb pueden ser observados cuidadosamente y los anlisis de sangre se realizarn de manera ms efectiva y precisa.  Si el trastorno se hace ms grave, podr ser necesario inducir el parto o practicar una cesrea (extraccin del beb por medios quirrgicos). El mejor tratamiento para la preeclampsia/eclampsia es el  parto. La preeclampsia y la eclampsia implican riesgos para la madre y el beb. El profesional lo comentar con usted.. Juntos pueden elaborar la mejor manera de abordar sus problemas.  INSTRUCCIONES PARA EL CUIDADO DOMICILIARIO  Cumpla con las citas y los anlisis prenatales tal como se le indic.  Consulte con el mdico si tiene alguno de [employer].  Descanse y duerma lo suficiente.  Consuma una dieta balanceada, baja en  sal, y no agregue sal a la comida.  Evite las situaciones estresantes.  Slo tome medicamentos de venta libre o de prescripcin para Chief Technology Officer, Environmental health practitioner o la fiebre, segn le haya indicado el mdico. SOLICITE ATENCIN MDICA DE INMEDIATO SI:  Observa que se hincha alguna zona del cuerpo, generalmente las piernas.  Aumenta 5 libras (2,3 Kg) o ms en una semana.  Presenta una cefalea intensa, mareos, problemas visuales o confusin.  Tiene dolor en el abdomen, nuseas o vmitos.  Sufre convulsiones.  Tiene dificultad para mover cualquier parte del cuerpo, siente adormecimiento o tiene dificultad para hablar.  Observa un hematoma o una hemorragia anormal.  Aparece rigidez en el cuello.  Vomita. ASEGRESE QUE:  Comprende estas instrucciones.  Controlar su enfermedad.  Solicitar ayuda inmediatamente si no mejora o si empeora. Document Released: 01/06/2005 Document Revised: 06/21/2011 [facility] [place] Patient Information 2014 [place], [place].  Ti?n S?n Gi?t v Ch?ng S?n Gi?t (Preeclampsia and Eclampsia) Ti?n s?n gi?t l tnh tr?ng cao huy?t p trong [place] k?. N c th? x?y ra ? tu?n th? 20 c?a [place] k? ho?c sau ?Marland Kitchen N?u cao huy?t p x?y ra ? n?a th? hai c?a thai k? m khng c cc tri?u ch?ng no khc, th ???c g?i l cao huy?t p [place] k? v bi?n m?t sau khi sinh con. N?u b?t c? tri?u ch?ng no ???c li?t k d??i ?y xu?t hi?n km v?i cao huy?t p [place] nghn, khi ?y n ???c g?i l ti?n s?n gi?t. Ch?ng s?n gi?t (co gi?t) c th? x?y ra sau ti?n s?n gi?t. ?y l m?t trong nh?ng l do c?a vi?c khm thai th??ng xuyn. Ch?n ?on v ?i?u tr? s?m r?t quan tr?ng ?? ng?n ng?a ch?ng s?n gi?t. NGUYN NHN Nguyn nhn c?a ti?n s?n gi?t/ch?ng s?n gi?t trong [place] k? v?n ch?a xc ??nh ???c. C m?t vi b?nh l ???c bi?t l c th? lm cho ph? n? mang [place] c nguy c?, nh? l:  Mang thai l?n ??u tin.  B? ti?n s?n gi?t ? l?n mang thai tr??c ?y.  Cao huy?t p ko di (m?n tnh).  ?a [place] (sinh ?i, sinh  ba).  Thai ph? tu?i 37 tr? ln.  Lai l?ch dn t?c l ng??i M? g?c Phi.  N?u b? b?nh th?n ho?c ti?u ???ng.  Cc b?nh n?i khoa nh? b?nh lupus hay cc b?nh v? mu.  Th?a cn (bo ph). TRI?U CH?NG  Cao huy?t p.  ?au ??u.  T?ng cn ??t ng?t.  S?ng tay, m?t, chn v bn chn.  N??c ti?u c protein.  C?m th?y kh ch?u trong d? dy (bu?n nn) v nn.  C v?n ?? v? th? l?c (nhn m? ho?c nhn ?i).  T m?t, tay, chn v bn chn.  Chng m?t.  Ni l?p.  Ti?n s?n gi?t c th? lm cho bo thai ch?m pht tri?n.  Tch (bong) bnh nhau.  Khng ?? d?ch trong ti ?i (thi?u ?i).  Nh?y c?m v?i nh sng chi.  ?au b?ng. CH?N ?ON N?u protein ???c tm th?y trong n??c ti?u ?  giai ?o?n hai c?a [place] k?, ?i?u ny ???c coi nh? b? ti?n s?n gi?t. Nh?ng tri?u ch?ng khc ???c ?? c?p ? trn c?ng c th? xu?t hi?n. ?I?U TR? Vi?c ?i?u tr? b?nh l ny l c?n thi?t.  Chuyn gia ch?m [place] s?c kh?e c th? b?t ph?i n?m ngh? ng?i s?m khi c tnh tr?ng ny. Ngh? ng?i nhi?u v h?n ch? ?n m?n c th? l t?t c? cc vi?c c?n ph?i lm.  C th? c?n dng thu?c ?? h? huy?t p n?u n khng c ?p ?ng v?i cc bi?n php b?o t?n nhi?u h?n.  Trong nh?ng tr??ng h?p n?ng h?n, c?n ph?i n?m vi?n:  ?? ?i?u tr? huy?t p.  ?? ki?m sot vi?c gi? l?i d?ch ?i.  ?? theo di thai nhi xem n?u tnh tr?ng ? c gy t?n h?i ??n thai nhi hay khng.  N?m vi?n l cch t?t nh?t ?? ?i?u tr? tri?u ch?ng ??u tin c?a ti?n s?n gi?t. ?i?u ny l ?? m? v b ???c theo di c?n th?n v cc xt nghi?m mu c th? ???c th?c hi?n m?t cch c hi?u qu? v chnh xc.  N?u b?nh tr? nn n?ng h?n th c th? c?n ph?i dng thu?c thc chuy?n d? ho?c m? ?? l?y ??a b ra (m? ??). Ph??ng thu?c t?t nh?t ?? ch?a ti?n s?n gi?t/ch?ng s?n gi?t l sinh con. Ti?n s?n gi?t v ch?ng s?n gi?t c nguy c? cho m? v b. Chuyn gia ch?m [place] s?c kh?e s? th?o lu?n v?i b?n v? cc nguy c?. ??ng th?i b?n c th? th?c hi?n ph??ng php t?t nh?t c th? cho cc v?n ?? ny. Hy  ch?c ch?n b?n ?i khm thai theo ?ng l?ch h?n. Vi?c khng gi? ?ng h?n c th? d?n ??n t?n th??ng, ?au, tn t?t mn tnh hay v?nh vi?n v lm b?n ho?c [place] nhi b? t?n th??ng hay t? vong. N?u c b?t c? v?n ?? no trong vi?c gi? ?ng h?n, ph?i g?i l?i phng khm ny ?? ???c h? tr?. H??NG D?N CH?M [place] T?I NH  Tun th? cc cu?c h?n khm tr??c sinh v th?c hi?n cc xt nghi?m theo l?ch h?n.  Ni cho chuyn gia ch?m [place] s?c kh?e bi?t n?u c b?t k? y?u t? nguy c? no trn ?y.  Hy ngh? ng?i v ng? nhi?u.  ?n ch? ?? ?n cn b?ng d??ng ch?t t mu?i v khng thm mu?i vo th?c ?n.  Trnh b? c?ng th?ng.  Ch? dng cc thu?ckhng c?n k toa v thu?c c?n k toa ?? gi?m ?au, gi?m c?m gic kh ch?u ho?c h? s?t theo h??ng d?n c?a chuyn gia ch?m  s?c kh?e. ?I KHM L?P T?C N?U:  B?n b? s?ng nhi?u ? b?t c? n?i no trn c? th?. V?n ?? ny th??ng l ? chn.  T?ng ln 5 pao (2,3 kg) tr? ln trong m?t tu?n.  B?n b? ?au ??u d? d?i, chng m?t, c v?n ?? v? th? gic ho?c l l?n.  B?n b? ?au b?ng, bu?n nn ho?c nn.  B?n b? co gi?t.  B?n b? kh c? ??ng b?t k? b? ph?n no c?a c? th? ho?c b? t hay c v?n ?? khi ni.  B?n b? b?m tm ho?c xu?t huy?t b?t th??ng ? b?t c? v? tr no trong c? th?.  B?n b? c?ng c?.  B?n b? b?t t?nh. ??M B?O B?N:  Hi?u cc h??ng d?n ny.  S? theo di tnh tr?ng c?a mnh.  S? yu c?u tr?  gip ngay l?p t?c n?u b?n khng ?? ho?c tnh tr?ng tr?m tr?ng h?n. Document Released: 03/29/2005 Document Revised: 11/29/2012 [facility] Patient Information 2014 [place], [place].

## 2013-04-02 NOTE — Progress Notes (Signed)
2+ proteinuria today, patient denies symptoms, will check labs.  NST performed today was reviewed and was found to be reactive.  Continue recommended antenatal testing and prenatal care. Patient now meets criteria for preeclampsia, will continue antenatal monitoring. If severe features, delivery indicated. If no severe features, delivery at 37 weeks. Precautions reviewed with patient, handout given to her to review at home On review of blood suagrs, three elevated postprandials in 130-140, otherwise within normal range. Advised diet adherence. Pelvic cultures done today, cervix is 4 cm dialted but still thick, multiparus cervix No other complaints or concerns.  Fetal movement and labor precautions reviewed.

## 2013-04-02 NOTE — Progress Notes (Signed)
P = 87    Pt denies H/A or visual changes.  [place] for growth scheduled @ MFM on 04/19/13

## 2013-04-03 LAB — GC/CHLAMYDIA PROBE AMP
CT Probe RNA: NEGATIVE
GC Probe RNA: NEGATIVE

## 2013-04-03 LAB — CULTURE, OB URINE
Colony Count: NO GROWTH
Organism ID, Bacteria: NO GROWTH

## 2013-04-04 ENCOUNTER — Encounter: Payer: Self-pay | Admitting: Obstetrics & Gynecology

## 2013-04-04 ENCOUNTER — Ambulatory Visit (INDEPENDENT_AMBULATORY_CARE_PROVIDER_SITE_OTHER): Payer: Medicaid Other | Admitting: *Deleted

## 2013-04-04 VITALS — BP 148/89

## 2013-04-04 DIAGNOSIS — O24919 Unspecified diabetes mellitus in pregnancy, unspecified trimester: Secondary | ICD-10-CM

## 2013-04-04 DIAGNOSIS — O99820 Streptococcus B carrier state complicating pregnancy: Secondary | ICD-10-CM | POA: Insufficient documentation

## 2013-04-04 DIAGNOSIS — O24913 Unspecified diabetes mellitus in pregnancy, third trimester: Secondary | ICD-10-CM

## 2013-04-04 NOTE — Progress Notes (Deleted)
P =              [place] for growth scheduled on 04/19/13.

## 2013-04-04 NOTE — Progress Notes (Signed)
P = 82    Pt denies H/A or visual disturbances.   IOL scheduled on 04/14/13 @ 0700 per pre-eclampsia protocol.

## 2013-04-04 NOTE — Progress Notes (Signed)
NST reviewed and reactive.  [name], M.D., FACOG    

## 2013-04-06 ENCOUNTER — Encounter (HOSPITAL_COMMUNITY): Payer: Self-pay | Admitting: *Deleted

## 2013-04-06 ENCOUNTER — Telehealth (HOSPITAL_COMMUNITY): Payer: Self-pay | Admitting: *Deleted

## 2013-04-06 NOTE — Telephone Encounter (Signed)
Preadmission screen  

## 2013-04-07 NOTE — Progress Notes (Signed)
NST reactive on 03/22/13 

## 2013-04-09 ENCOUNTER — Encounter: Payer: Self-pay | Admitting: Obstetrics and Gynecology

## 2013-04-09 ENCOUNTER — Encounter (HOSPITAL_COMMUNITY): Payer: Self-pay | Admitting: *Deleted

## 2013-04-09 ENCOUNTER — Inpatient Hospital Stay (HOSPITAL_COMMUNITY)
Admission: AD | Admit: 2013-04-09 | Discharge: 2013-04-13 | DRG: 765 | Disposition: A | Discharge status: Home / Self Care | Payer: Medicaid Other | Source: Ambulatory Visit | Attending: Obstetrics & Gynecology | Admitting: Obstetrics & Gynecology

## 2013-04-09 ENCOUNTER — Ambulatory Visit (INDEPENDENT_AMBULATORY_CARE_PROVIDER_SITE_OTHER): Payer: Medicaid Other | Admitting: Obstetrics and Gynecology

## 2013-04-09 VITALS — BP 168/90 | Temp 97.5°F | Wt 195.7 lb

## 2013-04-09 DIAGNOSIS — O403XX1 Polyhydramnios, third trimester, fetus 1: Secondary | ICD-10-CM

## 2013-04-09 DIAGNOSIS — O409XX0 Polyhydramnios, unspecified trimester, not applicable or unspecified: Secondary | ICD-10-CM | POA: Diagnosis present

## 2013-04-09 DIAGNOSIS — O99820 Streptococcus B carrier state complicating pregnancy: Secondary | ICD-10-CM

## 2013-04-09 DIAGNOSIS — O3660X0 Maternal care for excessive fetal growth, unspecified trimester, not applicable or unspecified: Secondary | ICD-10-CM

## 2013-04-09 DIAGNOSIS — IMO0002 Reserved for concepts with insufficient information to code with codable children: Principal | ICD-10-CM | POA: Diagnosis present

## 2013-04-09 DIAGNOSIS — Z9889 Other specified postprocedural states: Secondary | ICD-10-CM

## 2013-04-09 DIAGNOSIS — O09899 Supervision of other high risk pregnancies, unspecified trimester: Secondary | ICD-10-CM

## 2013-04-09 DIAGNOSIS — D689 Coagulation defect, unspecified: Secondary | ICD-10-CM | POA: Diagnosis present

## 2013-04-09 DIAGNOSIS — O9903 Anemia complicating the puerperium: Secondary | ICD-10-CM | POA: Diagnosis not present

## 2013-04-09 DIAGNOSIS — O1403 Mild to moderate pre-eclampsia, third trimester: Secondary | ICD-10-CM

## 2013-04-09 DIAGNOSIS — IMO0001 Reserved for inherently not codable concepts without codable children: Secondary | ICD-10-CM

## 2013-04-09 DIAGNOSIS — D649 Anemia, unspecified: Secondary | ICD-10-CM | POA: Diagnosis not present

## 2013-04-09 DIAGNOSIS — O3663X1 Maternal care for excessive fetal growth, third trimester, fetus 1: Secondary | ICD-10-CM

## 2013-04-09 DIAGNOSIS — Z22330 Carrier of Group B streptococcus: Secondary | ICD-10-CM

## 2013-04-09 DIAGNOSIS — D696 Thrombocytopenia, unspecified: Secondary | ICD-10-CM | POA: Diagnosis present

## 2013-04-09 DIAGNOSIS — O99892 Other specified diseases and conditions complicating childbirth: Secondary | ICD-10-CM | POA: Diagnosis present

## 2013-04-09 DIAGNOSIS — Z794 Long term (current) use of insulin: Secondary | ICD-10-CM

## 2013-04-09 DIAGNOSIS — O99814 Abnormal glucose complicating childbirth: Secondary | ICD-10-CM | POA: Diagnosis present

## 2013-04-09 DIAGNOSIS — E119 Type 2 diabetes mellitus without complications: Secondary | ICD-10-CM

## 2013-04-09 HISTORY — DX: Gestational (pregnancy-induced) hypertension without significant proteinuria, unspecified trimester: O13.9

## 2013-04-09 LAB — COMPREHENSIVE METABOLIC PANEL
ALT: 13 U/L (ref 0–35)
AST: 19 U/L (ref 0–37)
BUN: 6 mg/dL (ref 6–23)
CO2: 22 mEq/L (ref 19–32)
Calcium: 9.2 mg/dL (ref 8.4–10.5)
Creatinine, Ser: 0.59 mg/dL (ref 0.50–1.10)
GFR calc Af Amer: >90 mL/min (ref >90)
Glucose, Bld: 72 mg/dL (ref 70–99)
Sodium: 138 mEq/L (ref 135–145)
Total Protein: 6.9 g/dL (ref 6.0–8.3)

## 2013-04-09 LAB — POCT URINALYSIS DIP (DEVICE)
Bilirubin Urine: NEGATIVE
Nitrite: NEGATIVE
Protein, ur: 100 mg/dL — AB
Specific Gravity, Urine: 1.02 (ref 1.005–1.030)
Urobilinogen, UA: 0.2 mg/dL (ref 0.0–1.0)
pH: 7 (ref 5.0–8.0)

## 2013-04-09 LAB — CBC
HCT: 39.7 % (ref 36.0–46.0)
MCH: 28.4 pg (ref 26.0–34.0)
MCHC: 33.8 g/dL (ref 30.0–36.0)
MCV: 84.1 fL (ref 78.0–100.0)
Platelets: 112 10*3/uL — ABNORMAL LOW (ref 150–400)
RBC: 4.72 MIL/uL (ref 3.87–5.11)
RDW: 15.3 % (ref 11.5–15.5)

## 2013-04-09 LAB — RPR: RPR Ser Ql: NONREACTIVE

## 2013-04-09 LAB — GLUCOSE, CAPILLARY
Glucose-Capillary: 53 mg/dL — ABNORMAL LOW (ref 70–99)
Glucose-Capillary: 74 mg/dL (ref 70–99)

## 2013-04-09 LAB — PROTEIN / CREATININE RATIO, URINE: Protein Creatinine Ratio: 3.64 — ABNORMAL HIGH (ref 0.00–0.15)

## 2013-04-09 MED ORDER — MAGNESIUM SULFATE BOLUS VIA INFUSION
4.0000 g | Freq: Once | INTRAVENOUS | Status: AC
  Administered 2013-04-09: 4 g via INTRAVENOUS
  Filled 2013-04-09: qty 500

## 2013-04-09 MED ORDER — DEXTROSE IN LACTATED RINGERS 5 % IV SOLN
INTRAVENOUS | Status: DC
  Administered 2013-04-09 – 2013-04-10 (×2): via INTRAVENOUS

## 2013-04-09 MED ORDER — PENICILLIN G POTASSIUM 5000000 UNITS IJ SOLR
2.5000 10*6.[IU] | INTRAVENOUS | Status: DC
  Administered 2013-04-09 – 2013-04-10 (×4): 2.5 10*6.[IU] via INTRAVENOUS
  Filled 2013-04-09 (×8): qty 2.5

## 2013-04-09 MED ORDER — LACTATED RINGERS IV SOLN
INTRAVENOUS | Status: DC
  Administered 2013-04-09: 12:00:00 via INTRAVENOUS

## 2013-04-09 MED ORDER — CITRIC ACID-SODIUM CITRATE 334-500 MG/5ML PO SOLN
30.0000 mL | ORAL | Status: DC | PRN
  Administered 2013-04-10: 30 mL via ORAL
  Filled 2013-04-09: qty 15

## 2013-04-09 MED ORDER — LACTATED RINGERS IV SOLN: 500.0000 mL | INTRAVENOUS | Status: DC | PRN

## 2013-04-09 MED ORDER — PENICILLIN G POTASSIUM 5000000 UNITS IJ SOLR
5.0000 10*6.[IU] | Freq: Once | INTRAMUSCULAR | Status: AC
  Administered 2013-04-09: 5 10*6.[IU] via INTRAVENOUS
  Filled 2013-04-09: qty 5

## 2013-04-09 MED ORDER — FENTANYL CITRATE 0.05 MG/ML IJ SOLN
100.0000 ug | INTRAMUSCULAR | Status: DC | PRN
  Administered 2013-04-09: 100 ug via INTRAVENOUS
  Filled 2013-04-09: qty 2

## 2013-04-09 MED ORDER — OXYTOCIN 40 UNITS IN LACTATED RINGERS INFUSION - SIMPLE MED
1.0000 m[IU]/min | INTRAVENOUS | Status: DC
  Administered 2013-04-09: 2 m[IU]/min via INTRAVENOUS
  Filled 2013-04-09: qty 1000

## 2013-04-09 MED ORDER — ONDANSETRON HCL 4 MG/2ML IJ SOLN: 4.0000 mg | Freq: Four times a day (QID) | INTRAMUSCULAR | Status: DC | PRN

## 2013-04-09 MED ORDER — LABETALOL HCL 5 MG/ML IV SOLN
10.0000 mg | INTRAVENOUS | Status: DC | PRN
  Administered 2013-04-09: 10 mg via INTRAVENOUS
  Filled 2013-04-09 (×3): qty 4

## 2013-04-09 MED ORDER — OXYTOCIN BOLUS FROM INFUSION: 500.0000 mL | INTRAVENOUS | Status: DC

## 2013-04-09 MED ORDER — LIDOCAINE HCL (PF) 1 % IJ SOLN
30.0000 mL | INTRAMUSCULAR | Status: DC | PRN
  Filled 2013-04-09: qty 30

## 2013-04-09 MED ORDER — IBUPROFEN 600 MG PO TABS: 600.0000 mg | ORAL_TABLET | Freq: Four times a day (QID) | ORAL | Status: DC | PRN

## 2013-04-09 MED ORDER — MAGNESIUM SULFATE 40 G IN LACTATED RINGERS - SIMPLE
2.0000 g/h | INTRAVENOUS | Status: DC
  Administered 2013-04-09: 2 g/h via INTRAVENOUS
  Filled 2013-04-09: qty 500

## 2013-04-09 MED ORDER — DEXTROSE IN LACTATED RINGERS 5 % IV SOLN
INTRAVENOUS | Status: DC
  Filled 2013-04-09 (×4): qty 1000

## 2013-04-09 MED ORDER — OXYTOCIN 40 UNITS IN LACTATED RINGERS INFUSION - SIMPLE MED: 62.5000 mL/h | INTRAVENOUS | Status: DC

## 2013-04-09 MED ORDER — LABETALOL HCL 5 MG/ML IV SOLN
20.0000 mg | Freq: Once | INTRAVENOUS | Status: AC
  Administered 2013-04-10: 20 mg via INTRAVENOUS

## 2013-04-09 MED ORDER — TERBUTALINE SULFATE 1 MG/ML IJ SOLN: 0.2500 mg | Freq: Once | INTRAMUSCULAR | Status: AC | PRN

## 2013-04-09 MED ORDER — OXYCODONE-ACETAMINOPHEN 5-325 MG PO TABS: 1.0000 | ORAL_TABLET | ORAL | Status: DC | PRN

## 2013-04-09 MED ORDER — ACETAMINOPHEN 325 MG PO TABS: 650.0000 mg | ORAL_TABLET | ORAL | Status: DC | PRN

## 2013-04-09 NOTE — MAU Note (Signed)
Sent from clinic for PIH eval; 

## 2013-04-09 NOTE — Progress Notes (Signed)
P= 88 BP manually 168/88. C/o of mild headache in the morning, starting yesterday. Tylenol has helped.

## 2013-04-09 NOTE — Progress Notes (Signed)
[name] is a 37 y.o. G2P1001 at [redacted]w[redacted]d admitted for induction of labor due to Pre-eclamptic toxemia of pregnancy..  Subjective: No HA, or complaints at this time. Episode of hypoglycemia improved with sprite  Objective: BP 171/96  Pulse 88  Temp(Src) 98.2 F (36.8 C) (Oral)  Resp 18  Ht 5\' 1"  (1.549 m)  Wt 88.451 kg (195 lb)  BMI 36.86 kg/m2  LMP 07/20/2012    Filed Vitals:   04/09/13 1518 04/09/13 1601 04/09/13 1638 04/09/13 1639  BP: 164/76 167/88 171/96 171/96  Pulse: 89 85 88 88  Temp:  98.2 F (36.8 C)    TempSrc:  Oral    Resp: 18 18 18 18   Height:      Weight:           FHT:  FHR: 130s bpm, variability: moderate,  accelerations:  Present,  decelerations:  Absent UC:   irregular, every 2-6 minutes SVE:   Dilation: 6 Effacement (%): 70 Station: -2 Exam by:: Dr. Ike Alexander  Labs: Lab Results  Component Value Date   WBC 7.6 04/09/2013   HGB 13.4 04/09/2013   HCT 39.7 04/09/2013   MCV 84.1 04/09/2013   PLT 112* 04/09/2013    Assessment / Plan: Induction of labor due to preeclampsia  Labor: Will start on pit now that she has rec'd adequate ABX. Preeclampsia:  labs stable and increasing blood pressures, will start on magnesium at this time. Fetal Wellbeing:  Category I Pain Control:  Fentanyl as needed I/D:  GBS + rec'd Abx x2 Anticipated MOD:  NSVD  [name] 04/09/2013, 4:46 PM

## 2013-04-09 NOTE — Progress Notes (Signed)
Patient doing well, reports a mild headache, no visual disturbances or RUQ/epigastric pain. CBGs reviewed and all within range. Patient with elevated BP today and headache- will send to MAU for further evaluation. Patient is scheduled for IOL on 1/3

## 2013-04-09 NOTE — Progress Notes (Signed)
Raveen Wieseler is a 37 y.o. G2P1001 at [redacted]w[redacted]d by ultrasound admitted for induction of labor due to Pre-eclamptic toxemia of pregnancy..  Subjective:   Objective: BP 148/86  Pulse 86  Temp(Src) 97.9 F (36.6 C) (Oral)  Resp 18  Ht 5\' 1"  (1.549 m)  Wt 88.451 kg (195 lb)  BMI 36.86 kg/m2  LMP 07/20/2012   Total I/O In: 290 [P.O.:25; I.V.:265] Out: 550 [Urine:550]  FHT:  FHR: 150 bpm, variability: moderate,  accelerations:  Present,  decelerations:  Absent UC:   regular, every 4 minutes SVE:   Dilation: 6 Effacement (%): 80 Station: -2 Exam by:: Dr. Marice Potter AROM: clear fluid  Labs: Lab Results  Component Value Date   WBC 7.6 04/09/2013   HGB 13.4 04/09/2013   HCT 39.7 04/09/2013   MCV 84.1 04/09/2013   PLT 112* 04/09/2013    Assessment / Plan: Induction of labor due to preeclampsia,  progressing well on pitocin  Labor: Progressing on Pitocin, will continue to increase then AROM Preeclampsia:  on magnesium sulfate Fetal Wellbeing:  Category I Pain Control:  Labor support without medications I/D:  n/a Anticipated MOD:  NSVD  Jovante Hammitt C. 04/09/2013, 6:46 PM

## 2013-04-09 NOTE — MAU Note (Signed)
Sen up from clinic appointment for further eval of elevated BP, not a new problem.

## 2013-04-09 NOTE — MAU Note (Signed)
Called Infection Prevention spoke with [name] regarding infection/isolation. Pt to be placed on contact isolation.  [name] on L&D notified.Marland Kitchen

## 2013-04-09 NOTE — H&P (Signed)
[name] is a 37 y.o. female presenting for  IOL for preeclampsia with severe range blood pressures in clinic. Pt with signficant lower extremity swelling, no HA, vision changes or RUQ pain at this time. Pt also diagnosed with polyhydramios, LGA (95%tile) and DM2 on glyburide with reported good control. Pt has been diagnosed with Preeclampsia and has not been on medications. With BP in clinic this morning pt is being admitted for induction.  +FM, no LOF, no VB, occassional ctx.   History OB History   Grav Para Term Preterm Abortions TAB SAB Ect Mult Living   2 1 1  0 0     1     Past Medical History  Diagnosis Date  . Gestational diabetes     With first pregnancy, glyburide  . Pregnancy induced hypertension    History reviewed. No pertinent past surgical history. Family History: family history is negative for Alcohol abuse, Arthritis, Asthma, Birth defects, Cancer, COPD, Depression, Diabetes, Drug abuse, Early death, Hearing loss, Heart disease, Hyperlipidemia, Hypertension, Kidney disease, Learning disabilities, Mental illness, Mental retardation, Miscarriages / Stillbirths, Stroke, Vision loss, and Varicose Veins. Social History:  reports that she has never smoked. She has never used smokeless tobacco. She reports that she does not drink alcohol or use illicit drugs.  Clinic  [facility]  Genetic Screen Quad:    Abnormal - refer to MFM - elevated risk of Trisomy 18 of 1:23              NIPS: NML  Anatomic [place] INormal, fetal echo nml  GTT GDMA2 - Glyburide 5mg   TDaP vaccine October  Flu vaccine October   GBS Positive  Baby Food Breastfeed  Contraception Condoms  Circumcision Yes  Pediatrician [facility]    Prenatal Transfer Tool  Maternal Diabetes: Yes:  Diabetes Type:  Insulin/Medication controlled Genetic Screening: Normal Maternal Ultrasounds/Referrals: Abnormal:  Findings:   Other: polyhydramnios, LGA Fetal Ultrasounds or other Referrals:  Fetal echo Maternal Substance Abuse:   No Significant Maternal Medications:  Meds include: Other:  glyburide Significant Maternal Lab Results:  Lab values include: Group B Strep positive, pro:Cr >3 Other Comments:  IOL for severe Preeclampsia  ROS  Dilation: 5.5 Exam by:: Dr Marice Potter Blood pressure 157/91, pulse 91, temperature 98 F (36.7 C), temperature source Oral, resp. rate 18, height 5\' 1"  (1.549 m), weight 88.451 kg (195 lb), last menstrual period 07/20/2012. Exam Filed Vitals:   04/09/13 1148 04/09/13 1214 04/09/13 1237 04/09/13 1301  BP:  160/98 150/84 157/91  Pulse:  99 95 91  Temp: 98 F (36.7 C)     TempSrc: Oral     Resp:  18 18 18   Height: 5\' 1"  (1.549 m)     Weight: 88.451 kg (195 lb)       Physical Exam  Elevated Blood pessures, NAD RRR no MGT CTAB no wrc Gravid, NTTP, mildly distended Severe edema bilaterally 3+ above knee No c/c  Dilation: 5.5 Presentation: Vertex Exam by:: Dr Marice Potter  FHT: 145s mod var, mult accel, no decel Toco: q3-4  Prenatal labs: ABO, Rh: O/POS/-- (08/04 4540) Antibody: NEG (08/04 0937) Rubella: 0.38 (08/04 0937) RPR: NON REAC (11/03 1140)  HBsAg: NEGATIVE (08/04 0937)  HIV: NON REACTIVE (11/03 1140)  GBS: Positive (12/23 0000)   CBC    Component Value Date/Time   WBC 7.6 04/09/2013 1045   RBC 4.72 04/09/2013 1045   HGB 13.4 04/09/2013 1045   HCT 39.7 04/09/2013 1045   PLT 112* 04/09/2013 1045   MCV  84.1 04/09/2013 1045   MCH 28.4 04/09/2013 1045   MCHC 33.8 04/09/2013 1045   RDW 15.3 04/09/2013 1045   LYMPHSABS 1.8 11/13/2012 0937   MONOABS 0.5 11/13/2012 0937   EOSABS 0.1 11/13/2012 0937   BASOSABS 0.0 11/13/2012 0937    CMP     Component Value Date/Time   NA 138 04/09/2013 1045   K 3.2* 04/09/2013 1045   CL 103 04/09/2013 1045   CO2 22 04/09/2013 1045   GLUCOSE 72 04/09/2013 1045   BUN 6 04/09/2013 1045   CREATININE 0.59 04/09/2013 1045   CREATININE 0.60 04/02/2013 1123   CREATININE 0.48* 02/15/2013 1228   CALCIUM 9.2 04/09/2013 1045   PROT 6.9  04/09/2013 1045   ALBUMIN 2.9* 04/09/2013 1045   AST 19 04/09/2013 1045   ALT 13 04/09/2013 1045   ALKPHOS 189* 04/09/2013 1045   BILITOT 0.3 04/09/2013 1045   GFRNONAA >90 04/09/2013 1045   GFRAA >90 04/09/2013 1045   Pro Cr 3.64   Assessment/Plan: [name] is a 37 y.o. G2P1001 at [redacted]w[redacted]d here for IOL for preeclampsia with severe ranges #Labor: Pt will start with pitocin after 2nd bag of pcn is going. #Pain: Undecided. May have epdiural and IV pain Meds PRN #FWB:  Cat I #ID:  GBS+, tx with PCN #MOF: Breast #MOC:condoms #Circ:  Desires circumcision in house. #PreX: observe now, if severe ranges will start magnesium #thrombocytopenia: stable #DM2: monitor q4, hold glyburide at this time.   Tawana Scale, MD OB Fellow

## 2013-04-10 ENCOUNTER — Encounter (HOSPITAL_COMMUNITY): Payer: Medicaid Other | Admitting: Anesthesiology

## 2013-04-10 ENCOUNTER — Encounter (HOSPITAL_COMMUNITY): Payer: Self-pay | Admitting: Anesthesiology

## 2013-04-10 ENCOUNTER — Inpatient Hospital Stay (HOSPITAL_COMMUNITY): Payer: Medicaid Other | Admitting: Anesthesiology

## 2013-04-10 ENCOUNTER — Encounter (HOSPITAL_COMMUNITY)
Admission: AD | Disposition: A | Discharge status: Home / Self Care | Payer: Self-pay | Source: Ambulatory Visit | Attending: Obstetrics & Gynecology

## 2013-04-10 DIAGNOSIS — O99814 Abnormal glucose complicating childbirth: Secondary | ICD-10-CM

## 2013-04-10 DIAGNOSIS — Z9889 Other specified postprocedural states: Secondary | ICD-10-CM

## 2013-04-10 DIAGNOSIS — D696 Thrombocytopenia, unspecified: Secondary | ICD-10-CM

## 2013-04-10 DIAGNOSIS — IMO0002 Reserved for concepts with insufficient information to code with codable children: Secondary | ICD-10-CM

## 2013-04-10 HISTORY — PX: PERINEUM REPAIR: SHX2219

## 2013-04-10 LAB — GLUCOSE, CAPILLARY
Glucose-Capillary: 111 mg/dL — ABNORMAL HIGH (ref 70–99)
Glucose-Capillary: 220 mg/dL — ABNORMAL HIGH (ref 70–99)
Glucose-Capillary: 227 mg/dL — ABNORMAL HIGH (ref 70–99)

## 2013-04-10 LAB — CBC
HCT: 38.3 % (ref 36.0–46.0)
Hemoglobin: 13.2 g/dL (ref 12.0–15.0)
Hemoglobin: 9.6 g/dL — ABNORMAL LOW (ref 12.0–15.0)
MCH: 28.7 pg (ref 26.0–34.0)
MCHC: 34.5 g/dL (ref 30.0–36.0)
MCV: 83.3 fL (ref 78.0–100.0)
MCV: 85.1 fL (ref 78.0–100.0)
Platelets: 103 10*3/uL — ABNORMAL LOW (ref 150–400)
Platelets: 99 10*3/uL — ABNORMAL LOW (ref 150–400)
RBC: 3.29 MIL/uL — ABNORMAL LOW (ref 3.87–5.11)
RBC: 4.6 MIL/uL (ref 3.87–5.11)
WBC: 16.2 10*3/uL — ABNORMAL HIGH (ref 4.0–10.5)

## 2013-04-10 SURGERY — Surgical Case
Anesthesia: Epidural | Site: Vagina

## 2013-04-10 MED ORDER — LIDOCAINE HCL 1 % IJ SOLN
INTRAMUSCULAR | Status: DC | PRN
  Administered 2013-04-10 (×2): 20 mL

## 2013-04-10 MED ORDER — LACTATED RINGERS IV SOLN
INTRAVENOUS | Status: DC | PRN
  Administered 2013-04-10: 06:00:00 via INTRAVENOUS

## 2013-04-10 MED ORDER — FENTANYL CITRATE 0.05 MG/ML IJ SOLN
INTRAMUSCULAR | Status: DC | PRN
  Administered 2013-04-10 (×2): 100 ug via INTRAVENOUS

## 2013-04-10 MED ORDER — NALOXONE HCL 1 MG/ML IJ SOLN
1.0000 ug/kg/h | INTRAMUSCULAR | Status: DC | PRN
  Filled 2013-04-10: qty 2

## 2013-04-10 MED ORDER — SENNOSIDES-DOCUSATE SODIUM 8.6-50 MG PO TABS
2.0000 | ORAL_TABLET | ORAL | Status: DC
  Administered 2013-04-11 – 2013-04-12 (×3): 2 via ORAL
  Filled 2013-04-10 (×3): qty 2

## 2013-04-10 MED ORDER — CEFAZOLIN SODIUM-DEXTROSE 2-3 GM-% IV SOLR
INTRAVENOUS | Status: AC
  Filled 2013-04-10: qty 50

## 2013-04-10 MED ORDER — LIDOCAINE-EPINEPHRINE (PF) 2 %-1:200000 IJ SOLN
INTRAMUSCULAR | Status: AC
  Filled 2013-04-10: qty 20

## 2013-04-10 MED ORDER — DIPHENHYDRAMINE HCL 50 MG/ML IJ SOLN: 12.5000 mg | INTRAMUSCULAR | Status: DC | PRN

## 2013-04-10 MED ORDER — MAGNESIUM SULFATE 40 G IN LACTATED RINGERS - SIMPLE
2.0000 g/h | INTRAVENOUS | Status: AC
  Administered 2013-04-10 (×2): 2 g/h via INTRAVENOUS
  Filled 2013-04-10: qty 500

## 2013-04-10 MED ORDER — MIDAZOLAM HCL 2 MG/2ML IJ SOLN: 0.5000 mg | Freq: Once | INTRAMUSCULAR | Status: DC | PRN

## 2013-04-10 MED ORDER — PHENYLEPHRINE 40 MCG/ML (10ML) SYRINGE FOR IV PUSH (FOR BLOOD PRESSURE SUPPORT)
PREFILLED_SYRINGE | INTRAVENOUS | Status: AC
  Filled 2013-04-10: qty 15

## 2013-04-10 MED ORDER — METOCLOPRAMIDE HCL 5 MG/ML IJ SOLN: 10.0000 mg | Freq: Three times a day (TID) | INTRAMUSCULAR | Status: DC | PRN

## 2013-04-10 MED ORDER — NITROGLYCERIN 0.4 MG/SPRAY TL SOLN
Status: AC
  Filled 2013-04-10: qty 4.9

## 2013-04-10 MED ORDER — LANOLIN HYDROUS EX OINT: 1.0000 "application " | TOPICAL_OINTMENT | CUTANEOUS | Status: DC | PRN

## 2013-04-10 MED ORDER — EPHEDRINE 5 MG/ML INJ
10.0000 mg | INTRAVENOUS | Status: DC | PRN
Start: 2013-04-10 — End: 2013-04-10

## 2013-04-10 MED ORDER — SODIUM CHLORIDE 0.9 % IJ SOLN
3.0000 mL | INTRAMUSCULAR | Status: DC | PRN
  Administered 2013-04-11: 3 mL via INTRAVENOUS

## 2013-04-10 MED ORDER — LACTATED RINGERS IV SOLN
500.0000 mL | Freq: Once | INTRAVENOUS | Status: AC
  Administered 2013-04-10: 200 mL via INTRAVENOUS

## 2013-04-10 MED ORDER — LACTATED RINGERS IV SOLN
INTRAVENOUS | Status: DC
  Administered 2013-04-10: 21:00:00 via INTRAVENOUS

## 2013-04-10 MED ORDER — SODIUM BICARBONATE 8.4 % IV SOLN
INTRAVENOUS | Status: DC | PRN
  Administered 2013-04-10 (×2): 5 mL via EPIDURAL

## 2013-04-10 MED ORDER — GLYBURIDE 2.5 MG PO TABS
2.5000 mg | ORAL_TABLET | Freq: Two times a day (BID) | ORAL | Status: DC
  Administered 2013-04-10: 2.5 mg via ORAL
  Filled 2013-04-10 (×2): qty 1

## 2013-04-10 MED ORDER — EPHEDRINE SULFATE 50 MG/ML IJ SOLN
INTRAMUSCULAR | Status: DC | PRN
  Administered 2013-04-10 (×3): 10 mg via INTRAVENOUS

## 2013-04-10 MED ORDER — LIDOCAINE HCL 1 % IJ SOLN
INTRAMUSCULAR | Status: AC
  Filled 2013-04-10: qty 20

## 2013-04-10 MED ORDER — MISOPROSTOL 200 MCG PO TABS
ORAL_TABLET | ORAL | Status: AC
  Filled 2013-04-10: qty 5

## 2013-04-10 MED ORDER — MEPERIDINE HCL 25 MG/ML IJ SOLN: 6.2500 mg | INTRAMUSCULAR | Status: DC | PRN

## 2013-04-10 MED ORDER — MENTHOL 3 MG MT LOZG: 1.0000 | LOZENGE | OROMUCOSAL | Status: DC | PRN

## 2013-04-10 MED ORDER — ZOLPIDEM TARTRATE 5 MG PO TABS: 5.0000 mg | ORAL_TABLET | Freq: Every evening | ORAL | Status: DC | PRN

## 2013-04-10 MED ORDER — MORPHINE SULFATE 0.5 MG/ML IJ SOLN
INTRAMUSCULAR | Status: AC
  Filled 2013-04-10: qty 10

## 2013-04-10 MED ORDER — PHENYLEPHRINE 40 MCG/ML (10ML) SYRINGE FOR IV PUSH (FOR BLOOD PRESSURE SUPPORT)
80.0000 ug | PREFILLED_SYRINGE | INTRAVENOUS | Status: DC | PRN
  Filled 2013-04-10: qty 10

## 2013-04-10 MED ORDER — SIMETHICONE 80 MG PO CHEW
80.0000 mg | CHEWABLE_TABLET | Freq: Three times a day (TID) | ORAL | Status: DC
  Administered 2013-04-10 – 2013-04-12 (×7): 80 mg via ORAL
  Filled 2013-04-10 (×7): qty 1

## 2013-04-10 MED ORDER — LACTATED RINGERS IV SOLN
INTRAVENOUS | Status: DC | PRN
  Administered 2013-04-10 (×3): via INTRAVENOUS

## 2013-04-10 MED ORDER — SODIUM BICARBONATE 8.4 % IV SOLN
INTRAVENOUS | Status: AC
  Filled 2013-04-10: qty 50

## 2013-04-10 MED ORDER — DIBUCAINE 1 % RE OINT: 1.0000 "application " | TOPICAL_OINTMENT | RECTAL | Status: DC | PRN

## 2013-04-10 MED ORDER — FENTANYL CITRATE 0.05 MG/ML IJ SOLN
INTRAMUSCULAR | Status: AC
  Filled 2013-04-10: qty 2

## 2013-04-10 MED ORDER — ONDANSETRON HCL 4 MG/2ML IJ SOLN
4.0000 mg | INTRAMUSCULAR | Status: DC | PRN
  Administered 2013-04-10: 4 mg via INTRAVENOUS
  Filled 2013-04-10: qty 2

## 2013-04-10 MED ORDER — OXYTOCIN 10 UNIT/ML IJ SOLN
INTRAMUSCULAR | Status: AC
  Filled 2013-04-10: qty 4

## 2013-04-10 MED ORDER — EPHEDRINE 5 MG/ML INJ
INTRAVENOUS | Status: AC
  Filled 2013-04-10: qty 10

## 2013-04-10 MED ORDER — OXYTOCIN 10 UNIT/ML IJ SOLN
40.0000 [IU] | INTRAVENOUS | Status: DC | PRN
  Administered 2013-04-10: 40 [IU] via INTRAVENOUS

## 2013-04-10 MED ORDER — ONDANSETRON HCL 4 MG/2ML IJ SOLN
INTRAMUSCULAR | Status: AC
  Filled 2013-04-10: qty 2

## 2013-04-10 MED ORDER — PIPERACILLIN-TAZOBACTAM 3.375 G IVPB
3.3750 g | Freq: Three times a day (TID) | INTRAVENOUS | Status: AC
  Administered 2013-04-10 – 2013-04-11 (×3): 3.375 g via INTRAVENOUS
  Filled 2013-04-10 (×3): qty 50

## 2013-04-10 MED ORDER — DIPHENHYDRAMINE HCL 25 MG PO CAPS: 25.0000 mg | ORAL_CAPSULE | ORAL | Status: DC | PRN

## 2013-04-10 MED ORDER — FENTANYL CITRATE 0.05 MG/ML IJ SOLN: 25.0000 ug | INTRAMUSCULAR | Status: DC | PRN

## 2013-04-10 MED ORDER — NITROGLYCERIN 0.4 MG/SPRAY TL SOLN
Status: DC | PRN
  Administered 2013-04-10: 4 via SUBLINGUAL

## 2013-04-10 MED ORDER — ONDANSETRON HCL 4 MG/2ML IJ SOLN
INTRAMUSCULAR | Status: DC | PRN
  Administered 2013-04-10: 4 mg via INTRAVENOUS

## 2013-04-10 MED ORDER — FENTANYL 2.5 MCG/ML BUPIVACAINE 1/10 % EPIDURAL INFUSION (WH - ANES)
14.0000 mL/h | INTRAMUSCULAR | Status: DC | PRN
  Filled 2013-04-10: qty 125

## 2013-04-10 MED ORDER — MAGNESIUM SULFATE 40 G IN LACTATED RINGERS - SIMPLE
2.0000 g/h | INTRAVENOUS | Status: DC
  Filled 2013-04-10 (×2): qty 500

## 2013-04-10 MED ORDER — DOCUSATE SODIUM 100 MG PO CAPS
100.0000 mg | ORAL_CAPSULE | Freq: Two times a day (BID) | ORAL | Status: DC
  Administered 2013-04-10 – 2013-04-12 (×4): 100 mg via ORAL
  Filled 2013-04-10 (×5): qty 1

## 2013-04-10 MED ORDER — EPHEDRINE 5 MG/ML INJ
10.0000 mg | INTRAVENOUS | Status: DC | PRN
Start: 2013-04-10 — End: 2013-04-10
  Filled 2013-04-10: qty 4

## 2013-04-10 MED ORDER — MORPHINE SULFATE (PF) 0.5 MG/ML IJ SOLN
INTRAMUSCULAR | Status: DC | PRN
  Administered 2013-04-10: 3 mg via EPIDURAL

## 2013-04-10 MED ORDER — SCOPOLAMINE 1 MG/3DAYS TD PT72
1.0000 | MEDICATED_PATCH | Freq: Once | TRANSDERMAL | Status: AC
Start: 2013-04-10 — End: 2013-04-13
  Administered 2013-04-10: 1.5 mg via TRANSDERMAL
  Filled 2013-04-10 (×2): qty 1

## 2013-04-10 MED ORDER — SIMETHICONE 80 MG PO CHEW: 80.0000 mg | CHEWABLE_TABLET | ORAL | Status: DC | PRN

## 2013-04-10 MED ORDER — SIMETHICONE 80 MG PO CHEW
80.0000 mg | CHEWABLE_TABLET | ORAL | Status: DC
  Administered 2013-04-11 – 2013-04-12 (×3): 80 mg via ORAL
  Filled 2013-04-10 (×2): qty 1

## 2013-04-10 MED ORDER — FENTANYL 2.5 MCG/ML BUPIVACAINE 1/10 % EPIDURAL INFUSION (WH - ANES)
INTRAMUSCULAR | Status: DC | PRN
  Administered 2013-04-10: 11 mL/h via EPIDURAL

## 2013-04-10 MED ORDER — OXYTOCIN 40 UNITS IN LACTATED RINGERS INFUSION - SIMPLE MED: 62.5000 mL/h | INTRAVENOUS | Status: AC

## 2013-04-10 MED ORDER — PHENYLEPHRINE HCL 10 MG/ML IJ SOLN
INTRAMUSCULAR | Status: DC | PRN
  Administered 2013-04-10 (×7): 80 ug via INTRAVENOUS

## 2013-04-10 MED ORDER — BUPIVACAINE HCL (PF) 0.5 % IJ SOLN
INTRAMUSCULAR | Status: AC
  Filled 2013-04-10: qty 30

## 2013-04-10 MED ORDER — ONDANSETRON HCL 4 MG PO TABS: 4.0000 mg | ORAL_TABLET | ORAL | Status: DC | PRN

## 2013-04-10 MED ORDER — PHENYLEPHRINE 40 MCG/ML (10ML) SYRINGE FOR IV PUSH (FOR BLOOD PRESSURE SUPPORT): 80.0000 ug | PREFILLED_SYRINGE | INTRAVENOUS | Status: DC | PRN

## 2013-04-10 MED ORDER — ONDANSETRON HCL 4 MG/2ML IJ SOLN: 4.0000 mg | Freq: Three times a day (TID) | INTRAMUSCULAR | Status: DC | PRN

## 2013-04-10 MED ORDER — BUPIVACAINE HCL (PF) 0.5 % IJ SOLN
INTRAMUSCULAR | Status: DC | PRN
  Administered 2013-04-10: 30 mL

## 2013-04-10 MED ORDER — TETANUS-DIPHTH-ACELL PERTUSSIS 5-2.5-18.5 LF-MCG/0.5 IM SUSP: 0.5000 mL | Freq: Once | INTRAMUSCULAR | Status: DC

## 2013-04-10 MED ORDER — DIPHENHYDRAMINE HCL 25 MG PO CAPS: 25.0000 mg | ORAL_CAPSULE | Freq: Four times a day (QID) | ORAL | Status: DC | PRN

## 2013-04-10 MED ORDER — WITCH HAZEL-GLYCERIN EX PADS: 1.0000 "application " | MEDICATED_PAD | CUTANEOUS | Status: DC | PRN

## 2013-04-10 MED ORDER — IBUPROFEN 600 MG PO TABS
600.0000 mg | ORAL_TABLET | Freq: Four times a day (QID) | ORAL | Status: DC
  Administered 2013-04-10 – 2013-04-13 (×11): 600 mg via ORAL
  Filled 2013-04-10 (×11): qty 1

## 2013-04-10 MED ORDER — NALBUPHINE HCL 10 MG/ML IJ SOLN
5.0000 mg | INTRAMUSCULAR | Status: DC | PRN
  Filled 2013-04-10: qty 1

## 2013-04-10 MED ORDER — CEFAZOLIN SODIUM-DEXTROSE 2-3 GM-% IV SOLR
INTRAVENOUS | Status: DC | PRN
  Administered 2013-04-10: 2 g via INTRAVENOUS

## 2013-04-10 MED ORDER — OXYCODONE-ACETAMINOPHEN 5-325 MG PO TABS
1.0000 | ORAL_TABLET | ORAL | Status: DC | PRN
  Administered 2013-04-11: 1 via ORAL
  Administered 2013-04-11 (×2): 2 via ORAL
  Administered 2013-04-12: 1 via ORAL
  Filled 2013-04-10: qty 2
  Filled 2013-04-10 (×2): qty 1
  Filled 2013-04-10: qty 2

## 2013-04-10 MED ORDER — PRENATAL MULTIVITAMIN CH
1.0000 | ORAL_TABLET | Freq: Every day | ORAL | Status: DC
  Administered 2013-04-11 – 2013-04-12 (×2): 1 via ORAL
  Filled 2013-04-10 (×2): qty 1

## 2013-04-10 MED ORDER — LIDOCAINE HCL (PF) 1 % IJ SOLN
INTRAMUSCULAR | Status: DC | PRN
  Administered 2013-04-10: 3 mL
  Administered 2013-04-10: 4 mL

## 2013-04-10 MED ORDER — NALOXONE HCL 0.4 MG/ML IJ SOLN: 0.4000 mg | INTRAMUSCULAR | Status: DC | PRN

## 2013-04-10 MED ORDER — DIPHENHYDRAMINE HCL 50 MG/ML IJ SOLN: 25.0000 mg | INTRAMUSCULAR | Status: DC | PRN

## 2013-04-10 MED FILL — Sodium Chloride Flush IV Soln 0.9%: INTRAVENOUS | Qty: 40 | Status: AC

## 2013-04-10 MED FILL — Epinephrine HCl Soln Prefilled Syringe 0.1 MG/ML: INTRAMUSCULAR | Qty: 20 | Status: AC

## 2013-04-10 SURGICAL SUPPLY — 33 items
BARRIER ADHS 3X4 INTERCEED (GAUZE/BANDAGES/DRESSINGS) IMPLANT
CLAMP CORD UMBIL (MISCELLANEOUS) IMPLANT
CLOTH BEACON ORANGE TIMEOUT ST (SAFETY) ×3 IMPLANT
CONTAINER PREFILL 10% NBF 15ML (MISCELLANEOUS) IMPLANT
DRAPE LG THREE QUARTER DISP (DRAPES) IMPLANT
DRSG OPSITE POSTOP 4X10 (GAUZE/BANDAGES/DRESSINGS) ×3 IMPLANT
DURAPREP 26ML APPLICATOR (WOUND CARE) ×3 IMPLANT
ELECT REM PT RETURN 9FT ADLT (ELECTROSURGICAL) ×3
ELECTRODE REM PT RTRN 9FT ADLT (ELECTROSURGICAL) ×2 IMPLANT
GLOVE BIO SURGEON STRL SZ 6.5 (GLOVE) ×3 IMPLANT
GOWN PREVENTION PLUS XLARGE (GOWN DISPOSABLE) IMPLANT
GOWN STRL REIN XL XLG (GOWN DISPOSABLE) ×15 IMPLANT
KIT ABG SYR 3ML LUER SLIP (SYRINGE) IMPLANT
NEEDLE HYPO 25X5/8 SAFETYGLIDE (NEEDLE) ×6 IMPLANT
NEEDLE SPNL 18GX3.5 QUINCKE PK (NEEDLE) ×6 IMPLANT
NS IRRIG 1000ML POUR BTL (IV SOLUTION) ×3 IMPLANT
PACK C SECTION WH (CUSTOM PROCEDURE TRAY) ×3 IMPLANT
PAD OB MATERNITY 4.3X12.25 (PERSONAL CARE ITEMS) ×3 IMPLANT
SUT PDS AB 0 CTX 60 (SUTURE) IMPLANT
SUT VIC AB 0 CT1 27 (SUTURE)
SUT VIC AB 0 CT1 27XBRD ANBCTR (SUTURE) IMPLANT
SUT VIC AB 0 CT1 36 (SUTURE) ×12 IMPLANT
SUT VIC AB 2-0 CT1 27 (SUTURE) ×5
SUT VIC AB 2-0 CT1 TAPERPNT 27 (SUTURE) ×10 IMPLANT
SUT VIC AB 2-0 CTX 36 (SUTURE) IMPLANT
SUT VIC AB 3-0 CT1 27 (SUTURE)
SUT VIC AB 3-0 CT1 TAPERPNT 27 (SUTURE) IMPLANT
SUT VIC AB 3-0 SH 27 (SUTURE)
SUT VIC AB 3-0 SH 27X BRD (SUTURE) IMPLANT
SYR 30ML LL (SYRINGE) ×3 IMPLANT
TOWEL OR 17X24 6PK STRL BLUE (TOWEL DISPOSABLE) ×3 IMPLANT
TRAY FOLEY CATH 14FR (SET/KITS/TRAYS/PACK) ×3 IMPLANT
WATER STERILE IRR 1000ML POUR (IV SOLUTION) IMPLANT

## 2013-04-10 NOTE — Progress Notes (Signed)
[name] is a 37 y.o. G2P1001 at [redacted]w[redacted]d.  Subjective: copign adequately w/ UC's w/ Iv pain meds.   Objective: BP 140-160's/ 80-90's.   FHT:  FHR: 150 bpm, variability: moderate,  accelerations:  Present,  decelerations:  Present few variables UC:   regular, every 1-4 minutes, moderate SVE:   Dilation: 7.5 Effacement (%): 90 Station: -2 Exam by:: RN  Labs:  Assessment / Plan: Induction of labor due to preeclampsia,  progressing well on pitocin  Labor: Progressing normally Preeclampsia:  on magnesium sulfate and some severe range BPs. Labetalol ordered. Fetal Wellbeing:  Category I Pain Control:  Fentanyl I/D:  n/a Anticipated MOD:  NSVD  [name] 04/10/2013, 1:42 AM

## 2013-04-10 NOTE — Progress Notes (Signed)
UR chart review completed.  

## 2013-04-10 NOTE — Progress Notes (Signed)
Patient lost an 11+ pound 36 week baby during a Caesarian section.  Baby got irreparably stuck in the birth canal and was viable until death (summary of nurse's recounting).  Present were maternal grandmother and grandfather, God-daughter and one other.  The husband was also present with their 35 year old daughter.  His biggest issues were funeral arrangements, which with we assisted him so he could put it aside, and a perception that had everyone moved sooner, the baby would be alive.  He was not angry, but he had this thought, likely as part of the grieving process. Nurse and Chaplain periodically returned to this aspect of his grief periodically during the time spent, to help him at different stages to hear what actually happened.  Prayed over the baby.  Arranged for the priest (from [place], [place]) to come (had not come by 9am).  Chaplain provided steady presence and a female face for the husband to speak with.    Priest's presence was a necessary part of their process at this time.  Family is [place] (likely misspelled).  Husband and wife speak [place].  More difficult with maternal grandparents.  Never got a name for the baby, but I believe they did name it.  Rema Jasmine, Chaplain Pager: [phone]

## 2013-04-10 NOTE — Anesthesia Procedure Notes (Signed)
Epidural Patient location during procedure: OB Start time: 04/10/2013 12:58 AM  Staffing Anesthesiologist: Abaigeal Moomaw A. Performed by: anesthesiologist   Preanesthetic Checklist Completed: patient identified, site marked, surgical consent, pre-op evaluation, timeout performed, IV checked, risks and benefits discussed and monitors and equipment checked  Epidural Patient position: sitting Prep: site prepped and draped and DuraPrep Patient monitoring: continuous pulse ox and blood pressure Approach: midline Injection technique: LOR air  Needle:  Needle type: Tuohy  Needle gauge: 17 G Needle length: 9 cm and 9 Needle insertion depth: 6 cm Catheter type: closed end flexible Catheter size: 19 Gauge Catheter at skin depth: 11 cm Test dose: negative and Other  Assessment Events: blood not aspirated, injection not painful, no injection resistance, negative IV test and no paresthesia  Additional Notes Patient identified. Risks and benefits discussed including failed block, incomplete  Pain control, post dural puncture headache, nerve damage, paralysis, blood pressure Changes, nausea, vomiting, reactions to medications-both toxic and allergic and post Partum back pain. All questions were answered. Patient expressed understanding and wished to proceed. Sterile technique was used throughout procedure. Epidural site was Dressed with sterile barrier dressing. No paresthesias, signs of intravascular injection Or signs of intrathecal spread were encountered.  Patient was more comfortable after the epidural was dosed. Please see RN's note for documentation of vital signs and FHR which are stable.

## 2013-04-10 NOTE — Transfer of Care (Signed)
Immediate Anesthesia Transfer of Care Note  Patient: [name]  Procedure(s) Performed: Procedure(s): CESAREAN SECTION (N/A) EPISIOTOMY REPAIR (N/A)  Patient Location: PACU  Anesthesia Type:Epidural  Level of Consciousness: awake, alert , oriented and patient cooperative  Airway & Oxygen Therapy: Patient Spontanous Breathing  Post-op Assessment: Report given to PACU RN and Post -op Vital signs reviewed and stable  Post vital signs: Reviewed and stable  Complications: No apparent anesthesia complications

## 2013-04-10 NOTE — Progress Notes (Signed)
[name] is a 37 y.o. G2P1001 at [redacted]w[redacted]d.  Subjective: Requesting pain meds. Still trying to avoid epidural.   Objective: BP 100-170's/80-90's.   FHT:  FHR: 140 bpm, variability: moderate,  accelerations:  Present,  decelerations:  Absent UC:   regular, every 1.5-3 minutes, moderate SVE:   Dilation: 8 Effacement (%): 90 Station: -1 Exam by:: v.Neymar Dowe,cnm IUPC placed w/out difficulty  Labs: Lab Results  Component Value Date   WBC 14.1* 04/10/2013   HGB 13.2 04/10/2013   HCT 38.3 04/10/2013   MCV 83.3 04/10/2013   PLT 103* 04/10/2013    Assessment / Plan: Protracted latent phase  Labor: Progressing slowly. Suspect inadequate labor Preeclampsia:  on magnesium sulfate, no signs or symptoms of toxicity and Plts lower Fetal Wellbeing:  Category I Pain Control:  Fentanyl I/D:  n/a Anticipated MOD:  NSVD Titrate pit to achieve MVUs 180-240. Encouraged epidural to help w/ BPs. Will consider.  Last BP 106-90. Will give Labetalol if BPs continue in severe range. Pt grasping handrail w/ every UC. Encouraged to let go when BP taken.   Emeric Novinger 04/10/2013, 1:47 AM

## 2013-04-10 NOTE — Anesthesia Postprocedure Evaluation (Signed)
  Anesthesia Post-op Note  Anesthesia Post Note  Patient: [name]  Procedure(s) Performed: Procedure(s) (LRB): CESAREAN SECTION (N/A) EPISIOTOMY REPAIR (N/A)  Anesthesia type: Epidural  Patient location: PACU  Post pain: Pain level controlled  Post assessment: Post-op Vital signs reviewed  Last Vitals:  Filed Vitals:   04/10/13 0945  BP: 128/75  Pulse: 98  Temp:   Resp: 26    Post vital signs: stable  Level of consciousness: awake  Complications: No apparent anesthesia complications

## 2013-04-10 NOTE — Anesthesia Preprocedure Evaluation (Signed)
Anesthesia Evaluation  Patient identified by MRN, date of birth, ID band Patient awake    Reviewed: Allergy & Precautions, H&P , Patient's Chart, lab work & pertinent test results  Airway Mallampati: III TM Distance: >3 FB Neck ROM: Full    Dental no notable dental hx. (+) Teeth Intact   Pulmonary neg pulmonary ROS,  breath sounds clear to auscultation  Pulmonary exam normal       Cardiovascular hypertension, Pt. on medications Rhythm:Regular Rate:Normal     Neuro/Psych negative neurological ROS  negative psych ROS   GI/Hepatic negative GI ROS, Neg liver ROS,   Endo/Other  diabetes, Well Controlled, Gestational, Oral Hypoglycemic AgentsObesity  Renal/GU negative Renal ROS  negative genitourinary   Musculoskeletal negative musculoskeletal ROS (+)   Abdominal (+) + obese,   Peds  Hematology  (+) anemia , Thrombocytopenia   Anesthesia Other Findings   Reproductive/Obstetrics (+) Pregnancy PIH                           Anesthesia Physical Anesthesia Plan  ASA: III  Anesthesia Plan: Epidural   Post-op Pain Management:    Induction:   Airway Management Planned: Natural Airway  Additional Equipment:   Intra-op Plan:   Post-operative Plan:   Informed Consent: I have reviewed the patients History and Physical, chart, labs and discussed the procedure including the risks, benefits and alternatives for the proposed anesthesia with the patient or authorized representative who has indicated his/her understanding and acceptance.     Plan Discussed with: Anesthesiologist  Anesthesia Plan Comments:         Anesthesia Quick Evaluation

## 2013-04-10 NOTE — Anesthesia Postprocedure Evaluation (Signed)
  Anesthesia Post-op Note  Patient: [name]  Procedure(s) Performed: Procedure(s): CESAREAN SECTION (N/A) EPISIOTOMY REPAIR (N/A)  Patient Location: ICU  Anesthesia Type:Epidural  Level of Consciousness: awake, alert  and oriented  Airway and Oxygen Therapy: Patient Spontanous Breathing  Post-op Pain: none  Post-op Assessment: Post-op Vital signs reviewed, Patient's Cardiovascular Status Stable, No headache, No backache, No residual numbness and No residual motor weakness  Post-op Vital Signs: Reviewed and stable  Complications: No apparent anesthesia complications

## 2013-04-10 NOTE — Progress Notes (Signed)
Patient ID: [name], female   DOB: [birth year], 37 y.o.   MRN: [id number] LATE ENTRY:  Pt pushing involuntarily. CNM at [facility] at 0420. Ant lip/0. Reduced lip w/ one contraction. Normal progress of descent although pt pushing effectively only intermittently. Dr. Marice Potter called into room at [phone] for update. Vtx +3. Several contractions later vtx began to crown, but did not move w/ two contractions. Dr. Marice Potter called at that time. At [facility] 0521. Took over delivery. See note.  [place], [place] 04/10/2013 7:38 AM

## 2013-04-10 NOTE — Op Note (Signed)
04/10/2013  6:21 AM  PATIENT:  [name]  37 y.o. female  PRE-OPERATIVE DIAGNOSIS:  Shoulder dystocia with entrapped head  POST-OPERATIVE DIAGNOSIS:  same  PROCEDURE:  Procedure(s): CESAREAN SECTION (N/A)  SURGEON:  Surgeon(s) and Role:    * Allie Bossier, MD - Primary  PHYSICIAN ASSISTANT:   ASSISTANTS: Ocie Cornfield , MD   ANESTHESIA:   epidural  FINDINGS: female infant with Apgars of 0 & 0                    Normal pelvic anatomy                    Intact placenta                      EBL:  Total I/O In: 2284.9 [P.O.:120; I.V.:1764.9; IV Piggyback:400] Out: 1845 [Urine:1845]  BLOOD ADMINISTERED:none  DRAINS: none   LOCAL MEDICATIONS USED: none    SPECIMEN:  Source of Specimen:  cord blood  DISPOSITION OF SPECIMEN:  PATHOLOGY  COUNTS:  YES  TOURNIQUET:  * No tourniquets in log *  DICTATION: .Dragon Dictation  PLAN OF CARE: Admit to inpatient   PATIENT DISPOSITION:  PACU - hemodynamically stable.   Delay start of Pharmacological VTE agent (>24hrs) due to surgical blood loss or risk of bleeding: not applicable  I was called to the patient's room at the request of the midwife because the baby had had not moved for approximately 1 minute. After a prolonged gloves I moved the patient's labia around the baby's chin so that the head was completely out. McRoberts maneuver as well as suprapubic pressure was given the head did not budge I attempted a  corkscrew maneuver and still the head did not move. I was able to cut a episiotomy and reach my hand and and grabbed the posterior (left) shoulder. However I was not able to budge the baby in either direction. I was unable to break either the arm or the scapula. I called for any available OB in the hospital and Dr. Cassell Smiles graciously assisted. He repeated these same maneuvers and got the same result. On 2 occasions we placed the patient in All Fours position. During this time the OR and, anesthesia, and the NICU personnel  were all made aware of the situation. At the [facility] Dr. Billy Coast and I agreed that we should proceed to the operating room.Dr. Malen Gauze had bolused her epidural for surgery. With her epidural bolused for surgery in the OR, we made one last attempt to extract the baby through the vagina. This did not succeed. Her abdomen was then covered with iodine and draped is in a transverse incision and carried the incision down to the fascia the fascia was scored in the midline and the middle 20% of the rectus muscles were separated midline sharply the rectus muscles were pulled aside the bladder blade was placed after entering the peritoneum. A transverse incision and was able to remove one of the baby's legs the the incision. I was unable to get the second one, so I cut a T. Incision into the uterus approximately  3 cm in length. I was unable to remove both legs and with Dr. Arlana Pouch on and I both pulling on the baby and personnel pushing the head upwards to the vagina we were able to remove the baby. It was transferred to the NICU personnel for care. The placenta was extracted and sent to pathology for  evaluation. The uterus was exteriorized the bladder blade was placed. The T. Portion of the incision was closed with 2-0 Vicryl suture.this was done in a running locking fashion. Excellent hemostasis was noted and then the close the transverse portion of the hysterotomy incision in the same fashion. Excellent hemostasis was noted. The adnexa appeared normal the uterus was placed back into the abdominal cavity excellent hemostasis was again noted. The peritoneum was closed with 3-0 Vicryl suture. The fascia was closed with 0 Vicryl suture in a running nonlocking fashion. The sedation tissue was irrigated, cleaned, and dried.Staples were used to close incision. By this point my partner in practice doctor pretty constant had arrived to assist and she did the vaginal repair in the usual fashion. The instruments, sponge, and needle counts  were correct. The patient was taken to the recovery room.

## 2013-04-10 NOTE — OR Nursing (Signed)
Stat cesarean section for shoulder dystosia. See labor and delivery notes and safety zone portal.

## 2013-04-10 NOTE — Consult Note (Signed)
Neonatology Note:  Attendance at Code Apgar:   Our team responded to a Code Apgar call to room # 163 due to severe shoulder dystocia in progress. The requesting physician was Dr. Billy Coast initially, followed by Dr. Marice Potter. The mother is a G2P1 O pos, GBS positive with Type 2 GDM on glyburide, pre-eclampsia, polyhydramnios, and known LGA infant at 36 3/[redacted] weeks GA. ROM occurred 11 hours PTD and the fluid was clear. We responded to room 163. At our arrival, the shoulder dystocia had been identified for 3 minutes. All efforts at vaginal delivery were unsuccessful so, at 9 minutes, the patient was taken emergently to the OR for a Stat C-section under epidural anesthesia.  At the time of delivery, it was about 16-18 minutes from the time the shoulder dystocia was identified. The baby was flaccid, lifeless; apneic, no audible HR, deeply cyanotic. He had facial bruising and bruising of the left arm and scrotum noted; he was also markedly LGA (weight pending). I bulb suctioned for blood from the nares, then applied PPV. Chest excursion could be seen. I intubated the baby without difficulty at 1.5 minutes of life with a 3.5 mm ETT to a depth of 10 cm at the lip. Equal breath sounds were heard and the CO2 detector turned yellow immediately. We gave PPV for another 30 seconds, at which time the HR remained inaudible, so chest compressions were started. We continued these resuscitative efforts and gave 2 doses of Epinephrine 0.4 ml via the ETT twice. At 9 minutes there was still no HR and the resuscitation was discontinued with agreement of all members of the team. Ap 0/0.  I spoke with the mother in the OR, then I spoke with the father outside the OR to inform them.  Doretha Sou, MD

## 2013-04-10 NOTE — Progress Notes (Signed)
At approximately 0420, V.smith,cnm entered pts room and did a trial push with pt. After one push, pt was complete and began pushing.  CNM remained at bedside throughout second stage.  Care coordinator C. Wicker,rn came into room around 0445 and stayed for the remainder of second stage.  Dr Marice Potter called for update on pt at exactly [phone] and was told that pt was +3 station and progressing well.  Fetal head began crowning at 0518.  After slow progression through the next two contractions, v.smith, cnm asked for dr dove to come to room.  Extra assistance was requested at this time as well and entered room.  Dr Marice Potter in room at [phone] and delivered fetal head at [facility]  Mcroberts was immediately performed and an episiotomy cut.  At 0523 suprapubic pressure along with mcroberts was performed followed by Dr Marice Potter attempting woods screw and rubin maneuvers.  Additional md requested.  At 0524, code apgar pushed and dr Billy Coast in room to assist.  Overhead page for any OB in house also done. Rns moved pt to side lying position simultaneously.  At 0525, nicu team and house coverage arrive. Anesthesia and OR also notified of situation at 0525.  Pt moved back into lithotomy per md request and dr Billy Coast attempted to deliver posterior shoulder, woodscrew and rubin maneuver while rns continued suprapubic pressure.  Dr. Billy Coast and Dr Marice Potter attempted these manuevers for several more minutes. At 0528, dr Renaldo Fiddler enters room and dr foster from anesthesia requested to come to room.  At 0529, Dr Marice Potter and Billy Coast switch off doing Woodscrew, rubin manuevers, and gaskin all of which were unsuccessful. At 0533 zavanellis was attempted by dr Billy Coast and dr dove unsuccessfully.  At 0534, pt transported stat to OR.  In OR, Dr Marice Potter repeated maneuvers for vaginal delivery and reattempted zavanellis.  When this was unsuccessful, stat c-section performed and infant delivered at 0545.

## 2013-04-11 ENCOUNTER — Other Ambulatory Visit: Payer: Self-pay | Admitting: Obstetrics & Gynecology

## 2013-04-11 ENCOUNTER — Encounter (HOSPITAL_COMMUNITY): Payer: Self-pay | Admitting: Obstetrics & Gynecology

## 2013-04-11 LAB — CBC
HCT: 18.4 % — ABNORMAL LOW (ref 36.0–46.0)
HCT: 23.8 % — ABNORMAL LOW (ref 36.0–46.0)
Hemoglobin: 6.2 g/dL — CL (ref 12.0–15.0)
Hemoglobin: 8.2 g/dL — ABNORMAL LOW (ref 12.0–15.0)
MCH: 28.1 pg (ref 26.0–34.0)
MCH: 30.1 pg (ref 26.0–34.0)
MCHC: 33.7 g/dL (ref 30.0–36.0)
MCHC: 34.5 g/dL (ref 30.0–36.0)
MCV: 87.5 fL (ref 78.0–100.0)
RBC: 2.21 MIL/uL — ABNORMAL LOW (ref 3.87–5.11)
RDW: 15.7 % — ABNORMAL HIGH (ref 11.5–15.5)
RDW: 15.4 % (ref 11.5–15.5)
WBC: 15.6 10*3/uL — ABNORMAL HIGH (ref 4.0–10.5)

## 2013-04-11 LAB — GLUCOSE, CAPILLARY: Glucose-Capillary: 133 mg/dL — ABNORMAL HIGH (ref 70–99)

## 2013-04-11 LAB — PREPARE RBC (CROSSMATCH)

## 2013-04-11 MED ORDER — GLYBURIDE 5 MG PO TABS
5.0000 mg | ORAL_TABLET | Freq: Two times a day (BID) | ORAL | Status: DC
  Administered 2013-04-11 (×2): 5 mg via ORAL
  Filled 2013-04-11 (×5): qty 1

## 2013-04-11 MED ORDER — GLYBURIDE 5 MG PO TABS: 5.0000 mg | ORAL_TABLET | Freq: Two times a day (BID) | ORAL | Status: DC

## 2013-04-11 MED ORDER — BENZOCAINE-MENTHOL 20-0.5 % EX AERO
1.0000 "application " | INHALATION_SPRAY | Freq: Four times a day (QID) | CUTANEOUS | Status: DC | PRN
  Administered 2013-04-11: 1 via TOPICAL
  Filled 2013-04-11: qty 56

## 2013-04-11 MED ORDER — SODIUM CHLORIDE 0.9 % IV SOLN: 250.0000 mL | INTRAVENOUS | Status: DC | PRN

## 2013-04-11 MED ORDER — SODIUM CHLORIDE 0.9 % IJ SOLN: 3.0000 mL | INTRAMUSCULAR | Status: DC | PRN

## 2013-04-11 MED ORDER — SODIUM CHLORIDE 0.9 % IJ SOLN
3.0000 mL | Freq: Two times a day (BID) | INTRAMUSCULAR | Status: DC
  Administered 2013-04-11 – 2013-04-12 (×2): 3 mL via INTRAVENOUS

## 2013-04-11 NOTE — Progress Notes (Signed)
Subjective:fair pain control, not much appetite but has eaten Postpartum Day 1: Cesarean Delivery Patient reports incisional pain and tolerating PO.  Feels weak  Objective: Vital signs in last 24 hours: Temp:  [97.7 F (36.5 C)-98.9 F (37.2 C)] 98.5 F (36.9 C) (12/31 0758) Pulse Rate:  [75-113] 75 (12/31 0700) Resp:  [16-30] 16 (12/31 0758) BP: (87-155)/(48-89) 102/57 mmHg (12/31 0700) SpO2:  [91 %-100 %] 98 % (12/31 0700)  Physical Exam:  General: alert and cooperative Lochia: appropriate Uterine Fundus: firm Incision: healing well DVT Evaluation: No evidence of DVT seen on physical exam.   Recent Labs  04/10/13 0900 04/11/13 0525  HGB 9.6* 6.2*  HCT 28.0* 18.4*   CBG (last 3)   Recent Labs  04/10/13 1940 04/11/13 0019 04/11/13 0355  GLUCAP 227* 174* 170*    CMP     Component Value Date/Time   NA 138 04/09/2013 1045   K 3.2* 04/09/2013 1045   CL 103 04/09/2013 1045   CO2 22 04/09/2013 1045   GLUCOSE 72 04/09/2013 1045   BUN 6 04/09/2013 1045   CREATININE 0.59 04/09/2013 1045   CREATININE 0.60 04/02/2013 1123   CREATININE 0.48* 02/15/2013 1228   CALCIUM 9.2 04/09/2013 1045   PROT 6.9 04/09/2013 1045   ALBUMIN 2.9* 04/09/2013 1045   AST 19 04/09/2013 1045   ALT 13 04/09/2013 1045   ALKPHOS 189* 04/09/2013 1045   BILITOT 0.3 04/09/2013 1045   GFRNONAA >90 04/09/2013 1045   GFRAA >90 04/09/2013 1045      Assessment/Plan: Status post Cesarean section. Postoperative course complicated by anemia, s/p preeclampsia, diabetes  Offered transfusion for postop anemia, risks and benefits discussed and questions answered. @ units PRBC. Increase glyburide 5 mg BID.  [name] 04/11/2013, 8:04 AM

## 2013-04-12 DIAGNOSIS — O9903 Anemia complicating the puerperium: Secondary | ICD-10-CM

## 2013-04-12 LAB — TYPE AND SCREEN
ABO/RH(D): O POS
Antibody Screen: NEGATIVE
Unit division: 0
Unit division: 0

## 2013-04-12 MED ORDER — ENALAPRIL MALEATE 10 MG PO TABS
10.0000 mg | ORAL_TABLET | Freq: Every day | ORAL | Status: DC
  Administered 2013-04-12: 10 mg via ORAL
  Filled 2013-04-12 (×2): qty 1

## 2013-04-12 NOTE — Progress Notes (Signed)
Subjective: Postpartum Day 2: Cesarean Delivery Patient reports incisional pain.  Passing gas, no headache, scotomata, or rt upper quadrant pain  Objective: Vital signs in last 24 hours: Temp:  [97.6 F (36.4 C)-98.8 F (37.1 C)] 98.1 F (36.7 C) (01/01 0541) Pulse Rate:  [78-97] 87 (01/01 0541) Resp:  [16-20] 18 (01/01 0541) BP: (112-158)/(49-93) 155/93 mmHg (01/01 0541) SpO2:  [94 %-100 %] 99 % (01/01 0541)  Physical Exam:  General: alert, cooperative and no distress Lochia: appropriate Uterine Fundus: firm Incision: dressing is clean dry intact DVT Evaluation: Negative Homan's sign. No cords or calf tenderness.   Recent Labs  04/11/13 0525 04/11/13 2143  HGB 6.2* 8.2*  HCT 18.4* 23.8*    Assessment/Plan: Status post Cesarean section. Doing well postoperatively.  Continue current care. Pt wants to stay another day in order to have staples removed before discharge.   Start enalapril for HTN (Type 2 DM) [place] interpreter upon discharge. (Husband states that she doesn't understand as much as she says she does).    Miko Sirico H. 04/12/2013, 7:10 AM

## 2013-04-13 ENCOUNTER — Other Ambulatory Visit (HOSPITAL_COMMUNITY): Payer: Medicaid Other

## 2013-04-13 ENCOUNTER — Ambulatory Visit (HOSPITAL_COMMUNITY): Payer: Medicaid Other

## 2013-04-13 LAB — GLUCOSE, CAPILLARY: Glucose-Capillary: 77 mg/dL (ref 70–99)

## 2013-04-13 MED ORDER — METFORMIN HCL 1000 MG PO TABS: 1000.0000 mg | ORAL_TABLET | Freq: Two times a day (BID) | ORAL | Status: DC

## 2013-04-13 MED ORDER — OXYCODONE-ACETAMINOPHEN 5-325 MG PO TABS: 1.0000 | ORAL_TABLET | ORAL | Status: DC | PRN

## 2013-04-13 MED ORDER — ENALAPRIL MALEATE 10 MG PO TABS: 10.0000 mg | ORAL_TABLET | Freq: Every day | ORAL | Status: DC

## 2013-04-13 MED ORDER — DOCUSATE SODIUM 100 MG PO CAPS: 100.0000 mg | ORAL_CAPSULE | Freq: Two times a day (BID) | ORAL | Status: DC

## 2013-04-13 MED ORDER — IBUPROFEN 600 MG PO TABS: 600.0000 mg | ORAL_TABLET | Freq: Four times a day (QID) | ORAL | Status: DC

## 2013-04-13 NOTE — Progress Notes (Signed)
Pt's AC dinner CBG was 76 at 1933. Pt's HS CBG was 119 at 2230. Data not transferring into computer.

## 2013-04-13 NOTE — Discharge Summary (Signed)
Obstetric Discharge Summary Reason for Admission: induction of labor secondary to preeclampsia Prenatal Procedures: none Intrapartum Procedures: Vaginal delivery complicated by severe shoulder dystocia resulting in fetal demise and requiring cesarean section for delivery Postpartum Procedures: none Complications-Operative and Postpartum: 4th degree perineal laceration Hemoglobin  Date Value Range Status  04/11/2013 8.2* 12.0 - 15.0 g/dL Final     DELTA CHECK NOTED     REPEATED TO VERIFY     POST TRANSFUSION SPECIMEN     HCT  Date Value Range Status  04/11/2013 23.8* 36.0 - 46.0 % Final    Physical Exam:  General: alert, cooperative and no distress Lochia: appropriate Uterine Fundus: firm, non tender Incision: healing well, no significant drainage, no dehiscence, no significant erythema DVT Evaluation: No evidence of DVT seen on physical exam.  Discharge Diagnoses: Preelampsia and severe shoulder dystocia, fetal demise  Discharge Information: Date: 04/13/2013 Activity: pelvic rest Diet: diabetic diet Medications: Ibuprofen, Colace and Percocet, enalapril Condition: stable Instructions: refer to practice specific booklet Discharge to: home Follow-up Information   [facility] (An appointment will be made for you to follow up in 2 weeks)    Specialty:  Obstetrics and Gynecology   Contact information:   [address] [place] [id number] [phone]      Newborn Data: Live born female  Birth Weight: 11 lb 5.7 oz (5151 g) APGAR: 0, 0    [name] 04/13/2013, 9:00 AM

## 2013-04-13 NOTE — Progress Notes (Signed)
04/13/13 1300  Clinical Encounter Type  Visited With Family (husband [place])  Visit Type Follow-up;Spiritual support;Social support  Spiritual Encounters  Spiritual Needs Grief support;Emotional   Chaplain Dyanne CarrelKaty Claussen and I had follow-up conversations with husband Lars MageJuan when we returned to hospital in order to follow [facility].  Provided pastoral presence, reflective listening, grief education, and encouragement to practice self-care (including counseling resources).  Lars MageJuan was very Adult nurseappreciative.  [address]Cha[address] Masaichi Kracht [place], [place] [phone]

## 2013-04-13 NOTE — Progress Notes (Signed)
I spoke with Air cabin crewBirth Registrar as family was interested in a copy of the report of fetal death.  The registrar gave me a phone number for the [place] to provide family with so that they could call in 3-4 weeks to acquire certificate.  I also made a certificate with baby's name, date of birth , length and weight to present to family.  I spoke with social work to acquire counseling referrals for trauma care and gave those to family.  Pt and family consented to receive a follow up phone call next week from Spiritual Care.  They gave verbal permission to leave a message if they were not available when we called.  I provided follow-up grief support and accompanied them on the walk downstairs to their car.    Centex CorporationChaplain Katy Volney Reierson Pager, [phone] 11:02 AM   04/13/13 1000  Clinical Encounter Type  Visited With Patient and family together  Visit Type Follow-up;Spiritual support  Referral From Chaplain  Consult/Referral To (counseling, heart strings)  Recommendations Pt consented to follow-up phone call including leaving a message if no answer  Spiritual Encounters  Spiritual Needs Grief support  Stress Factors  Patient Stress Factors Loss

## 2013-04-13 NOTE — Discharge Instructions (Signed)
Cesarean Delivery °Care After °Refer to this sheet in the next few weeks. These instructions provide you with information on caring for yourself after your procedure. Your health care provider may also give you specific instructions. Your treatment has been planned according to current medical practices, but problems sometimes occur. Call your health care provider if you have any problems or questions after you go home. °HOME CARE INSTRUCTIONS  °· Only take over-the-counter or prescription medications as directed by your health care provider. °· Do not drink alcohol, especially if you are breastfeeding or taking medication to relieve pain. °· Do not chew or smoke tobacco. °· Continue to use good perineal care. Good perineal care includes: °· Wiping your perineum from front to back. °· Keeping your perineum clean. °· Check your surgical cut (incision) daily for increased redness, drainage, swelling, or separation of skin. °· Clean your incision gently with soap and water every day, and then pat it dry. If your health care provider says it is OK, leave the incision uncovered. Use a bandage (dressing) if the incision is draining fluid or appears irritated. If the adhesive strips across the incision do not fall off within 7 days, carefully peel them off. °· Hug a pillow when coughing or sneezing until your incision is healed. This helps to relieve pain. °· Do not use tampons or douche until your health care provider says it is okay. °· Shower, wash your hair, and take tub baths as directed by your health care provider. °· Wear a well-fitting bra that provides breast support. °· Limit wearing support panties or control-top hose. °· Drink enough fluids to keep your urine clear or pale yellow. °· Eat high-fiber foods such as whole grain cereals and breads, brown rice, beans, and fresh fruits and vegetables every day. These foods may help prevent or relieve constipation. °· Resume activities such as climbing stairs,  driving, lifting, exercising, or traveling as directed by your health care provider. °· Talk to your health care provider about resuming sexual activities. This is dependent upon your risk of infection, your rate of healing, and your comfort and desire to resume sexual activity. °· Try to have someone help you with your household activities and your newborn for at least a few days after you leave the hospital. °· Rest as much as possible. Try to rest or take a nap when your newborn is sleeping. °· Increase your activities gradually. °· Keep all of your scheduled postpartum appointments. It is very important to keep your scheduled follow-up appointments. At these appointments, your health care provider will be checking to make sure that you are healing physically and emotionally. °SEEK MEDICAL CARE IF:  °· You are passing large clots from your vagina. Save any clots to show your health care provider. °· You have a foul smelling discharge from your vagina. °· You have trouble urinating. °· You are urinating frequently. °· You have pain when you urinate. °· You have a change in your bowel movements. °· You have increasing redness, pain, or swelling near your incision. °· You have pus draining from your incision. °· Your incision is separating. °· You have painful, hard, or reddened breasts. °· You have a severe headache. °· You have blurred vision or see spots. °· You feel sad or depressed. °· You have thoughts of hurting yourself or your newborn. °· You have questions about your care, the care of your newborn, or medications. °· You are dizzy or lightheaded. °· You have a rash. °· You   have pain, redness, or swelling at the site of the removed intravenous access (IV) tube. °· You have nausea or vomiting. °· You stopped breastfeeding and have not had a menstrual period within 12 weeks of stopping. °· You are not breastfeeding and have not had a menstrual period within 12 weeks of delivery. °· You have a fever. °SEEK  IMMEDIATE MEDICAL CARE IF: °· You have persistent pain. °· You have chest pain. °· You have shortness of breath. °· You faint. °· You have leg pain. °· You have stomach pain. °· Your vaginal bleeding saturates 2 or more sanitary pads in 1 hour. °MAKE SURE YOU:  °· Understand these instructions. °· Will watch your condition. °· Will get help right away if you are not doing well or get worse. °Document Released: 12/19/2001 Document Revised: 11/29/2012 Document Reviewed: 11/24/2011 °ExitCare® Patient Information ©2014 [employer]. ° ° ° °

## 2013-04-14 ENCOUNTER — Inpatient Hospital Stay (HOSPITAL_COMMUNITY): Admission: RE | Admit: 2013-04-14 | Payer: Medicaid Other | Source: Ambulatory Visit

## 2013-04-16 ENCOUNTER — Encounter (HOSPITAL_COMMUNITY): Payer: Self-pay | Admitting: *Deleted

## 2013-04-16 ENCOUNTER — Inpatient Hospital Stay (HOSPITAL_COMMUNITY)
Admission: AD | Admit: 2013-04-16 | Discharge: 2013-04-16 | Disposition: A | Discharge status: Home / Self Care | Payer: Medicaid Other | Source: Ambulatory Visit | Attending: Obstetrics & Gynecology | Admitting: Obstetrics & Gynecology

## 2013-04-16 DIAGNOSIS — O2390 Unspecified genitourinary tract infection in pregnancy, unspecified trimester: Secondary | ICD-10-CM | POA: Insufficient documentation

## 2013-04-16 DIAGNOSIS — N390 Urinary tract infection, site not specified: Secondary | ICD-10-CM | POA: Insufficient documentation

## 2013-04-16 DIAGNOSIS — O864 Pyrexia of unknown origin following delivery: Secondary | ICD-10-CM | POA: Insufficient documentation

## 2013-04-16 HISTORY — DX: Obesity, unspecified: E66.9

## 2013-04-16 LAB — CBC WITH DIFFERENTIAL/PLATELET
BASOS ABS: 0 10*3/uL (ref 0.0–0.1)
Basophils Relative: 0 % (ref 0–1)
Eosinophils Absolute: 0.1 10*3/uL (ref 0.0–0.7)
Eosinophils Relative: 1 % (ref 0–5)
HEMATOCRIT: 28.9 % — AB (ref 36.0–46.0)
Hemoglobin: 9.6 g/dL — ABNORMAL LOW (ref 12.0–15.0)
LYMPHS PCT: 16 % (ref 12–46)
Lymphs Abs: 2 10*3/uL (ref 0.7–4.0)
MCH: 29.3 pg (ref 26.0–34.0)
MCHC: 33.2 g/dL (ref 30.0–36.0)
MCV: 88.1 fL (ref 78.0–100.0)
MONO ABS: 1 10*3/uL (ref 0.1–1.0)
Monocytes Relative: 8 % (ref 3–12)
NEUTROS ABS: 9.3 10*3/uL — AB (ref 1.7–7.7)
NEUTROS PCT: 75 % (ref 43–77)
Platelets: 237 10*3/uL (ref 150–400)
RBC: 3.28 MIL/uL — ABNORMAL LOW (ref 3.87–5.11)
RDW: 15.2 % (ref 11.5–15.5)
WBC: 12.4 10*3/uL — AB (ref 4.0–10.5)

## 2013-04-16 LAB — COMPREHENSIVE METABOLIC PANEL
ALBUMIN: 2.9 g/dL — AB (ref 3.5–5.2)
ALK PHOS: 107 U/L (ref 39–117)
ALT: 15 U/L (ref 0–35)
AST: 17 U/L (ref 0–37)
BILIRUBIN TOTAL: 0.3 mg/dL (ref 0.3–1.2)
BUN: 12 mg/dL (ref 6–23)
CHLORIDE: 101 meq/L (ref 96–112)
CO2: 24 meq/L (ref 19–32)
Calcium: 8.9 mg/dL (ref 8.4–10.5)
Creatinine, Ser: 0.57 mg/dL (ref 0.50–1.10)
GFR calc Af Amer: >90 mL/min (ref >90)
Glucose, Bld: 116 mg/dL — ABNORMAL HIGH (ref 70–99)
POTASSIUM: 3.5 meq/L — AB (ref 3.7–5.3)
SODIUM: 139 meq/L (ref 137–147)
Total Protein: 6.9 g/dL (ref 6.0–8.3)

## 2013-04-16 LAB — GLUCOSE, CAPILLARY
GLUCOSE-CAPILLARY: 119 mg/dL — AB (ref 70–99)
Glucose-Capillary: 124 mg/dL — ABNORMAL HIGH (ref 70–99)
Glucose-Capillary: 91 mg/dL (ref 70–99)
Glucose-Capillary: 68 mg/dL — ABNORMAL LOW (ref 70–99)
Glucose-Capillary: 76 mg/dL (ref 70–99)

## 2013-04-16 LAB — URINALYSIS, ROUTINE W REFLEX MICROSCOPIC
BILIRUBIN URINE: NEGATIVE
Glucose, UA: NEGATIVE mg/dL
Ketones, ur: 15 mg/dL — AB
Nitrite: NEGATIVE
PROTEIN: 30 mg/dL — AB
Specific Gravity, Urine: 1.015 (ref 1.005–1.030)
UROBILINOGEN UA: 0.2 mg/dL (ref 0.0–1.0)
pH: 6.5 (ref 5.0–8.0)

## 2013-04-16 LAB — URINE MICROSCOPIC-ADD ON

## 2013-04-16 MED ORDER — SULFAMETHOXAZOLE-TMP DS 800-160 MG PO TABS: 1.0000 | ORAL_TABLET | Freq: Two times a day (BID) | ORAL | Status: DC

## 2013-04-16 NOTE — Discharge Instructions (Signed)
Catheter-Associated Urinary Tract Infection FAQs WHAT IS "CATHETER-ASSOCIATED" URINARY TRACT INFECTION? A urinary tract infection (also called "UTI") is an infection in the urinary system, which includes the bladder (which stores the urine) and the kidneys (which filter the blood to make urine). Germs (for example, bacteria or yeasts) do not normally live in these areas; but if germs are introduced, an infection can occur. If you have a urinary catheter, germs can travel along the catheter and cause an infection in your bladder or your kidney; in that case it is called a catheter-associated urinary tract infection (or "CA-UTI").  WHAT IS A URINARY CATHETER? A urinary catheter is a thin tube placed in the bladder to drain urine. Urine drains through the tube into a bag that collects the urine. A urinary  catheter may be used:  If you are not able to urinate on your own.  To measure the amount of urine that you make, for example, during intensive care.  During and after some types of surgery.  During some tests of the kidneys and bladder . People with urinary catheters have a much higher chance of getting a urinary tract infection than people who don't have a catheter. HOW DO I GET A CATHETER-ASSOCIATED URINARY TRACT INFECTION (CA-UTI)? If germs enter the urinary tract, they may cause an infection. Many of the germs that cause a catheter-associated urinary tract infection are common germs found in your intestines that do not usually cause an infection there. Germs can enter the urinary tract when the catheter is being put in or while the catheter remains in the bladder.  WHAT ARE THE SYMPTOMS OF A URINARY TRACT INFECTION?  Some of the common symptoms of a urinary tract infection are:  Burning or pain in the lower abdomen (that is, below the stomach).  Fever.  Bloody urine may be a sign of infection, but is also caused by other problems .  Burning during urination or an increase in the  frequency of urination after the catheter is removed. Sometimes people with catheter-associated urinary tract infections do not have these symptoms of infection. CAN CATHETER-ASSOCIATED URINARY TRACT INFECTIONS BE TREATED? Yes, most catheter-associated urinary tract infections can be treated with antibiotics and removal or change of the catheter. Your doctor will determine which antibiotic is best for you.  WHAT ARE SOME OF THE THINGS THAT HOSPITALS ARE DOING TO PREVENT CATHETER-ASSOCIATED URINARY TRACT INFECTIONS? To prevent urinary tract infections, doctors and nurses take the following actions.  Catheter insertion  Catheters are put in only when necessary and they are removed as soon as possible.  Only properly trained persons insert catheters using sterile ("clean") technique.  The skin in the area where the catheter will be inserted is cleaned before inserting the catheter.  Other methods to drain the urine are sometimes used, such as:  External catheters in men (these look like condoms and are placed over the penis rather than into the penis)  Putting a temporary catheter in to drain the urine and removing it right away. This is called intermittent urethral catheterization. Catheter care  Healthcare providers clean their hands by washing them with soap and water or using an alcohol-based hand rub before and after touching your catheter.  If you do not see your providers clean their hands, please ask them to do so.  Avoid disconnecting the catheter and drain tube. This helps to prevent germs from getting into the catheter tube.  The catheter is secured to the leg to prevent pulling on the  catheter.  Avoid twisting or kinking the catheter.  Keep the bag lower than the bladder to prevent urine from backflowing to the bladder.  Empty the bag regularly. The drainage spout should not touch anything while emptying the bag. WHAT CAN I DO TO HELP PREVENT CATHETER-ASSOCIATED URINARY  TRACT INFECTIONS IF I HAVE A CATHETER?  Always clean your hands before and after doing catheter care.  Always keep your urine bag below the level of your bladder.  Do not tug or pull on the tubing.  Do not twist or kink the catheter tubing.  Ask your healthcare provider each day if you still need the catheter. WHAT DO I NEED TO DO WHEN I GO HOME FROM THE HOSPITAL?  If you will be going home with a catheter, your doctor or nurse should explain everything you need to know about taking care of the catheter. Make sure you understand how to care for it before you leave the hospital.  If you develop any of the symptoms of a urinary tract infection, such as burning or pain in the lower abdomen, fever, or an increase in the frequency of urination, contact your doctor or nurse immediately.  Before you go home, make sure you know who to contact if you have questions or problems after you get home. If you have questions, please ask your doctor or nurse. Developed and co-sponsored by [employer] for [employer] of [place] [phone]); Infectious Diseases Society of America (IDSA); [facility] Association; Association for Professionals in Infection Control and Epidemiology (APIC); Center for Disease Control (CDC); and The Joint Commission Document Released: 12/22/2011 Document Reviewed: 12/22/2011 [facility] Patient Information 2014 [place], [place]. Diabetes Meal Planning Guide The diabetes meal planning guide is a tool to help you plan your meals and snacks. It is important for people with diabetes to manage their blood glucose (sugar) levels. Choosing the right foods and the right amounts throughout your day will help control your blood glucose. Eating right can even help you improve your blood pressure and reach or maintain a healthy weight. CARBOHYDRATE COUNTING MADE EASY When you eat carbohydrates, they turn to sugar. This raises your blood glucose level. Counting carbohydrates can  help you control this level so you feel better. When you plan your meals by counting carbohydrates, you can have more flexibility in what you eat and balance your medicine with your food intake. Carbohydrate counting simply means adding up the total amount of carbohydrate grams in your meals and snacks. Try to eat about the same amount at each meal. Foods with carbohydrates are listed below. Each portion below is 1 carbohydrate serving or 15 grams of carbohydrates. Ask your dietician how many grams of carbohydrates you should eat at each meal or snack. Grains and Starches  1 slice bread.   English muffin or hotdog/hamburger bun.   cup cold cereal (unsweetened).   cup cooked pasta or rice.   cup starchy vegetables (corn, potatoes, peas, beans, winter squash).  1 tortilla (6 inches).   bagel.  1 waffle or pancake (size of a CD).   cup cooked cereal.  4 to 6 small crackers. *Whole grain is recommended. Fruit  1 cup fresh unsweetened berries, melon, papaya, pineapple.  1 small fresh fruit.   banana or mango.   cup fruit juice (4 oz unsweetened).   cup canned fruit in natural juice or water.  2 tbs dried fruit.  12 to 15 grapes or cherries. Milk and Yogurt  1 cup fat-free or 1%  milk.  1 cup soy milk.  6 oz light yogurt with sugar-free sweetener.  6 oz low-fat soy yogurt.  6 oz plain yogurt. Vegetables  1 cup raw or  cup cooked is counted as 0 carbohydrates or a "free" food.  If you eat 3 or more servings at 1 meal, count them as 1 carbohydrate serving. Other Carbohydrates   oz chips or pretzels.   cup ice cream or frozen yogurt.   cup sherbet or sorbet.  2 inch square cake, no frosting.  1 tbs honey, sugar, jam, jelly, or syrup.  2 small cookies.  3 squares of graham crackers.  3 cups popcorn.  6 crackers.  1 cup broth-based soup.  Count 1 cup casserole or other mixed foods as 2 carbohydrate servings.  Foods with less than 20  calories in a serving may be counted as 0 carbohydrates or a "free" food. You may want to purchase a book or computer software that lists the carbohydrate gram counts of different foods. In addition, the nutrition facts panel on the labels of the foods you eat are a good source of this information. The label will tell you how big the serving size is and the total number of carbohydrate grams you will be eating per serving. Divide this number by 15 to obtain the number of carbohydrate servings in a portion. Remember, 1 carbohydrate serving equals 15 grams of carbohydrate. SERVING SIZES Measuring foods and serving sizes helps you make sure you are getting the right amount of food. The list below tells how big or small some common serving sizes are.  1 oz.........4 stacked dice.  3 oz........Marland Kitchen.Deck of cards.  1 tsp.......Marland Kitchen.Tip of little finger.  1 tbs......Marland Kitchen.Marland Kitchen.Thumb.  2 tbs.......Marland Kitchen.Golf ball.   cup......Marland Kitchen.Half of a fist.  1 cup.......Marland Kitchen.A fist. SAMPLE DIABETES MEAL PLAN Below is a sample meal plan that includes foods from the grain and starches, dairy, vegetable, fruit, and meat groups. A dietician can individualize a meal plan to fit your calorie needs and tell you the number of servings needed from each food group. However, controlling the total amount of carbohydrates in your meal or snack is more important than making sure you include all of the food groups at every meal. You may interchange carbohydrate containing foods (dairy, starches, and fruits). The meal plan below is an example of a 2000 calorie diet using carbohydrate counting. This meal plan has 17 carbohydrate servings. Breakfast  1 cup oatmeal (2 carb servings).   cup light yogurt (1 carb serving).  1 cup blueberries (1 carb serving).   cup almonds. Snack  1 large apple (2 carb servings).  1 low-fat string cheese stick. Lunch  Chicken breast salad.  1 cup spinach.   cup chopped tomatoes.  2 oz chicken breast,  sliced.  2 tbs low-fat [place] dressing.  12 whole-wheat crackers (2 carb servings).  12 to 15 grapes (1 carb serving).  1 cup low-fat milk (1 carb serving). Snack  1 cup carrots.   cup hummus (1 carb serving). Dinner  3 oz broiled salmon.  1 cup brown rice (3 carb servings). Snack  1  cups steamed broccoli (1 carb serving) drizzled with 1 tsp olive oil and lemon juice.  1 cup light pudding (2 carb servings). DIABETES MEAL PLANNING WORKSHEET Your dietician can use this worksheet to help you decide how many servings of foods and what types of foods are right for you.  BREAKFAST Food Group and Servings / Carb Servings Grain/Starches __________________________________ Dairy __________________________________________  Vegetable ______________________________________ Fruit ___________________________________________ Meat __________________________________________ Fat ____________________________________________ LUNCH Food Group and Servings / Carb Servings Grain/Starches ___________________________________ Dairy ___________________________________________ Fruit ____________________________________________ Meat ___________________________________________ Fat _____________________________________________ Laural Golden Food Group and Servings / Carb Servings Grain/Starches ___________________________________ Dairy ___________________________________________ Fruit ____________________________________________ Meat ___________________________________________ Fat _____________________________________________ SNACKS Food Group and Servings / Carb Servings Grain/Starches ___________________________________ Dairy ___________________________________________ Vegetable _______________________________________ Fruit ____________________________________________ Meat ___________________________________________ Fat _____________________________________________ DAILY TOTALS Starches  _________________________ Vegetable ________________________ Fruit ____________________________ Dairy ____________________________ Meat ____________________________ Fat ______________________________ Document Released: 12/24/2004 Document Revised: 06/21/2011 Document Reviewed: 11/04/2008 ExitCare Patient Information 2014 [place].

## 2013-04-16 NOTE — MAU Provider Note (Signed)
History   This married [place] lady is now 6 days post op s/p emergent LTCS for entraped fetal head and failed Zavaneli maneuver. She also has a fourth degree vaginal tear and was induced at 36 weeks for pre eclampsia. She comes in with 2 complaints. She is worried about her blood pressure but is asymptomatic. Her husband has been taking her BPs at home. She also reports a fever since last evening. She denies constipation and is using a laxative. She denies abdominal/uterine pain.  CSN: [id number]  Arrival date and time: 04/16/13 1449   First Provider Initiated Contact with Patient 04/16/13 1629      Chief Complaint  Patient presents with  . Fever  . Hypertension   HPI  As above  Past Medical History  Diagnosis Date  . Gestational diabetes     With first pregnancy, glyburide  . Pregnancy induced hypertension   . Obesity (BMI 30-39.9)     Past Surgical History  Procedure Laterality Date  . Cesarean section N/A 04/10/2013    Procedure: CESAREAN SECTION;  Surgeon: Allie Bossier, MD;  Location: [facility];  Service: Obstetrics;  Laterality: N/A;  . Perineum repair N/A 04/10/2013    Procedure: EPISIOTOMY REPAIR;  Surgeon: Allie Bossier, MD;  Location: [facility];  Service: Obstetrics;  Laterality: N/A;    Family History  Problem Relation Age of Onset  . Alcohol abuse Neg Hx   . Arthritis Neg Hx   . Asthma Neg Hx   . Birth defects Neg Hx   . Cancer Neg Hx   . COPD Neg Hx   . Depression Neg Hx   . Diabetes Neg Hx   . Drug abuse Neg Hx   . Early death Neg Hx   . Hearing loss Neg Hx   . Heart disease Neg Hx   . Hyperlipidemia Neg Hx   . Hypertension Neg Hx   . Kidney disease Neg Hx   . Learning disabilities Neg Hx   . Mental illness Neg Hx   . Mental retardation Neg Hx   . Miscarriages / Stillbirths Neg Hx   . Stroke Neg Hx   . Vision loss Neg Hx   . Varicose Veins Neg Hx     History  Substance Use Topics  . Smoking status: Never Smoker   . Smokeless tobacco: Never Used   . Alcohol Use: No    Allergies: No Known Allergies  Prescriptions prior to admission  Medication Sig Dispense Refill  . docusate sodium (COLACE) 100 MG capsule Take 1 capsule (100 mg total) by mouth 2 (two) times daily.  60 capsule  3  . enalapril (VASOTEC) 10 MG tablet Take 1 tablet (10 mg total) by mouth daily.  30 tablet  4  . ibuprofen (ADVIL,MOTRIN) 600 MG tablet Take 1 tablet (600 mg total) by mouth every 6 (six) hours.  30 tablet  0  . metFORMIN (GLUCOPHAGE) 1000 MG tablet Take 1,000 mg by mouth 3 (three) times daily as needed (takes only if sugar levels are high).      Marland Kitchen oxyCODONE-acetaminophen (PERCOCET/ROXICET) 5-325 MG per tablet Take 1-2 tablets by mouth every 4 (four) hours as needed for severe pain (moderate - severe pain).  30 tablet  0  . ACCU-CHEK FASTCLIX LANCETS MISC 1 each by Percutaneous route 4 (four) times daily.  100 each  12  . glucose blood test strip Use as instructed  100 each  12    ROS Physical Exam   Blood  pressure 165/94, pulse 88, temperature 100.1 F (37.8 C), temperature source Oral, resp. rate 16, last menstrual period 07/20/2012, SpO2 99.00%.  Physical Exam Heart- rrr Lungs- CTAB Abd- obese, benign, uterus firm at U-2 and distinctly non tender Incision- healing well without erythema or drainage or other sign of infection Back- No CVAT  MAU Course  Procedures     Assessment and Plan  UTI- I will send a urine culture and treat with bactrim DS for 7 days. RTC for worsening fever/pain I have sent an email to the clinic to schedule her an appt for next week for a BP check.  Ahni Bradwell C. 04/16/2013, 4:50 PM

## 2013-04-16 NOTE — MAU Note (Signed)
Patient states she delivered on 12-30. States she has had a fever as high as 102 off and on and has had elevated blood pressure.

## 2013-04-17 LAB — URINE CULTURE

## 2013-04-18 ENCOUNTER — Ambulatory Visit (HOSPITAL_COMMUNITY): Payer: Medicaid Other

## 2013-04-18 ENCOUNTER — Encounter: Payer: Self-pay | Admitting: *Deleted

## 2013-04-18 NOTE — Addendum Note (Signed)
Encounter addended by: Particia NearingMartha Felisa Zechman, MD on: 04/18/2013 10:53 PM<BR>     Documentation filed: Clinical Notes

## 2013-04-19 ENCOUNTER — Other Ambulatory Visit (HOSPITAL_COMMUNITY): Payer: Medicaid Other

## 2013-04-19 ENCOUNTER — Ambulatory Visit (HOSPITAL_COMMUNITY): Payer: Medicaid Other

## 2013-04-26 ENCOUNTER — Telehealth: Payer: Self-pay | Admitting: *Deleted

## 2013-04-26 NOTE — Telephone Encounter (Signed)
Called patient to request that she bring her blood sugar meter to her appt tomorrow.

## 2013-04-26 NOTE — Telephone Encounter (Signed)
Called patient and left detailed message to bring her meter with her to the clinic tomorrow.

## 2013-04-27 ENCOUNTER — Encounter: Payer: Self-pay | Admitting: Family Medicine

## 2013-04-27 ENCOUNTER — Ambulatory Visit (INDEPENDENT_AMBULATORY_CARE_PROVIDER_SITE_OTHER): Payer: Medicaid Other | Admitting: Family Medicine

## 2013-04-27 VITALS — BP 148/105 | HR 93 | Temp 98.1°F | Ht 61.0 in | Wt 134.0 lb

## 2013-04-27 DIAGNOSIS — I10 Essential (primary) hypertension: Secondary | ICD-10-CM

## 2013-04-27 DIAGNOSIS — O1002 Pre-existing essential hypertension complicating childbirth: Secondary | ICD-10-CM

## 2013-04-27 DIAGNOSIS — E119 Type 2 diabetes mellitus without complications: Secondary | ICD-10-CM

## 2013-04-27 DIAGNOSIS — Z4889 Encounter for other specified surgical aftercare: Secondary | ICD-10-CM

## 2013-04-27 DIAGNOSIS — Z09 Encounter for follow-up examination after completed treatment for conditions other than malignant neoplasm: Secondary | ICD-10-CM

## 2013-04-27 MED ORDER — METFORMIN HCL 1000 MG PO TABS: 1000.0000 mg | ORAL_TABLET | Freq: Two times a day (BID) | ORAL | Status: DC

## 2013-04-27 NOTE — Patient Instructions (Addendum)
You are healing very well! Everything looks like it should  I want you to start the blood pressure medicine every day but we can talk about having you come off it at your postpartum visit in 1 month  The metformin does not work to lower your blood sugar but it does make your body work better to process sugar. Because of this, you must take it morning and night no matter what your blood sugar is.  I have changed your prescription to say this.  Please let me know if you have any questions.

## 2013-04-27 NOTE — Progress Notes (Addendum)
GYN  OFFICE NOTE  Chief Complaint:  Post op wound check   Primary Care Physician: TA, CAT, MD  HPI:  [name] is a 38 yo G2P1101 now 16 days post op s/p emergent LTCS for entraped fetal head and failed Zavaneli maneuver resulting in fetal demise. She also has a fourth degree vaginal tear and was induced at 36 weeks for pre eclampsia.   Pt was spoken to today without an interpreter as she declined interpreting services. She was seen alone without her husband.   Doing well overall. Coping well. Has periods of tearfulness particularly if she sees babies. Sleeping at night. Type 2 dm- hasn't been taking metformin unless her BS is high above 150. Has been treated high blood pressure based on how high it is. Hasn't taken it in the last 3 days.  States that her husband manages her medications and he tells her when to take them or not based on the values. She did not bring her meter today for blood sugars but says that yesterday they were 123 and 138. Her husband let her take one dose of metformin in the AM when it was 138.    Some minor pain at c/s incision site. No irritation at 4th degree site. Taking docusate and having normal BMs. Bleeding less than 1 pad a day.    No fevers, chills, rash, cp, sob, nausea, vomiting, diarrhea.    PMHx:  Past Medical History  Diagnosis Date  . Gestational diabetes     With first pregnancy, glyburide  . Pregnancy induced hypertension   . Obesity (BMI 30-39.9)     Past Surgical History  Procedure Laterality Date  . Cesarean section N/A 04/10/2013    Procedure: CESAREAN SECTION;  Surgeon: Allie BossierMyra C Dove, MD;  Location: [facility];  Service: Obstetrics;  Laterality: N/A;  . Perineum repair N/A 04/10/2013    Procedure: EPISIOTOMY REPAIR;  Surgeon: Allie BossierMyra C Dove, MD;  Location: [facility];  Service: Obstetrics;  Laterality: N/A;    FAMHx:  Family History  Problem Relation Age of Onset  . Alcohol abuse Neg Hx   . Arthritis Neg Hx   . Asthma Neg Hx   . Birth defects  Neg Hx   . Cancer Neg Hx   . COPD Neg Hx   . Depression Neg Hx   . Diabetes Neg Hx   . Drug abuse Neg Hx   . Early death Neg Hx   . Hearing loss Neg Hx   . Heart disease Neg Hx   . Hyperlipidemia Neg Hx   . Hypertension Neg Hx   . Kidney disease Neg Hx   . Learning disabilities Neg Hx   . Mental illness Neg Hx   . Mental retardation Neg Hx   . Miscarriages / Stillbirths Neg Hx   . Stroke Neg Hx   . Vision loss Neg Hx   . Varicose Veins Neg Hx     SOCHx:   reports that she has never smoked. She has never used smokeless tobacco. She reports that she does not drink alcohol or use illicit drugs.  ALLERGIES:  No Known Allergies  ROS: Pertinent ROS as seen in HPI. Otherwise negative.   HOME MEDS: Current Outpatient Prescriptions  Medication Sig Dispense Refill  . ACCU-CHEK FASTCLIX LANCETS MISC 1 each by Percutaneous route 3 (three) times daily.      Marland Kitchen. docusate sodium (COLACE) 100 MG capsule Take 1 capsule (100 mg total) by mouth 2 (two) times daily.  60  capsule  3  . enalapril (VASOTEC) 10 MG tablet Take 1 tablet (10 mg total) by mouth daily.  30 tablet  4  . glucose blood test strip Use as instructed  100 each  12  . ibuprofen (ADVIL,MOTRIN) 600 MG tablet Take 1 tablet (600 mg total) by mouth every 6 (six) hours.  30 tablet  0  . metFORMIN (GLUCOPHAGE) 1000 MG tablet Take 1,000 mg by mouth 3 (three) times daily as needed (takes only if sugar levels are high).      Marland Kitchen oxyCODONE-acetaminophen (PERCOCET/ROXICET) 5-325 MG per tablet Take 1-2 tablets by mouth every 4 (four) hours as needed for severe pain (moderate - severe pain).  30 tablet  0  . sulfamethoxazole-trimethoprim (BACTRIM DS) 800-160 MG per tablet Take 1 tablet by mouth 2 (two) times daily.  14 tablet  0   No current facility-administered medications for this visit.    LABS/IMAGING: No results found for this or any previous visit (from the past 48 hour(s)). No results found.  VITALS: BP 148/105  Pulse 93   Temp(Src) 98.1 F (36.7 C) (Oral)  Ht 5\' 1"  (1.549 m)  Wt 60.782 kg (134 lb)  BMI 25.33 kg/m2  EXAM: Gen: NAD, well appearing, appropriately tearful at times.  ABD: soft, NT, ND. C/s incision c/d/i and well healed.  GU: well healed vagina and perineum. No granulation tissue noted. Vicryl knot with long strings at introitus causing irritation. String ends trimmed. Knot left in place.  EXT: 2+ DP pulses, no edema    ASSESSMENT: Encounter for postoperative wound check  Diabetes mellitus  Benign essential hypertension with delivery  PLAN: 1.  Wound checks - healing well - no concerns - cont stool softener  2) HTN - still elevated but improved from MAU - start enalapril daily until next visit  3) type 2 DM - discussed way to take metformin and how it works -rx changed to say BID with meals - pt given these instructions - told that if her husband has concerns, he is welcome to call and talk to me.  He should not be regulating her medication though.  - sugars per pt have been 120s-130s per report.   4) mood - appropriately sad - coping well - surprisingly positive with great insight   will need to f/u in 1 month for recheck and pp visit   Nikiya Starn L, MD

## 2013-05-02 ENCOUNTER — Encounter: Payer: Self-pay | Admitting: *Deleted

## 2013-05-07 ENCOUNTER — Other Ambulatory Visit (HOSPITAL_COMMUNITY): Payer: Self-pay | Admitting: Obstetrics and Gynecology

## 2013-05-31 ENCOUNTER — Ambulatory Visit (INDEPENDENT_AMBULATORY_CARE_PROVIDER_SITE_OTHER): Payer: Medicaid Other | Admitting: Medical

## 2013-05-31 ENCOUNTER — Encounter: Payer: Self-pay | Admitting: Medical

## 2013-05-31 NOTE — Patient Instructions (Signed)
Cesarean Delivery °Care After °Refer to this sheet in the next few weeks. These instructions provide you with information on caring for yourself after your procedure. Your health care provider may also give you specific instructions. Your treatment has been planned according to current medical practices, but problems sometimes occur. Call your health care provider if you have any problems or questions after you go home. °HOME CARE INSTRUCTIONS  °· Only take over-the-counter or prescription medications as directed by your health care provider. °· Do not drink alcohol, especially if you are breastfeeding or taking medication to relieve pain. °· Do not chew or smoke tobacco. °· Continue to use good perineal care. Good perineal care includes: °· Wiping your perineum from front to back. °· Keeping your perineum clean. °· Check your surgical cut (incision) daily for increased redness, drainage, swelling, or separation of skin. °· Clean your incision gently with soap and water every day, and then pat it dry. If your health care provider says it is OK, leave the incision uncovered. Use a bandage (dressing) if the incision is draining fluid or appears irritated. If the adhesive strips across the incision do not fall off within 7 days, carefully peel them off. °· Hug a pillow when coughing or sneezing until your incision is healed. This helps to relieve pain. °· Do not use tampons or douche until your health care provider says it is okay. °· Shower, wash your hair, and take tub baths as directed by your health care provider. °· Wear a well-fitting bra that provides breast support. °· Limit wearing support panties or control-top hose. °· Drink enough fluids to keep your urine clear or pale yellow. °· Eat high-fiber foods such as whole grain cereals and breads, brown rice, beans, and fresh fruits and vegetables every day. These foods may help prevent or relieve constipation. °· Resume activities such as climbing stairs,  driving, lifting, exercising, or traveling as directed by your health care provider. °· Talk to your health care provider about resuming sexual activities. This is dependent upon your risk of infection, your rate of healing, and your comfort and desire to resume sexual activity. °· Try to have someone help you with your household activities and your newborn for at least a few days after you leave the hospital. °· Rest as much as possible. Try to rest or take a nap when your newborn is sleeping. °· Increase your activities gradually. °· Keep all of your scheduled postpartum appointments. It is very important to keep your scheduled follow-up appointments. At these appointments, your health care provider will be checking to make sure that you are healing physically and emotionally. °SEEK MEDICAL CARE IF:  °· You are passing large clots from your vagina. Save any clots to show your health care provider. °· You have a foul smelling discharge from your vagina. °· You have trouble urinating. °· You are urinating frequently. °· You have pain when you urinate. °· You have a change in your bowel movements. °· You have increasing redness, pain, or swelling near your incision. °· You have pus draining from your incision. °· Your incision is separating. °· You have painful, hard, or reddened breasts. °· You have a severe headache. °· You have blurred vision or see spots. °· You feel sad or depressed. °· You have thoughts of hurting yourself or your newborn. °· You have questions about your care, the care of your newborn, or medications. °· You are dizzy or lightheaded. °· You have a rash. °· You   have pain, redness, or swelling at the site of the removed intravenous access (IV) tube. °· You have nausea or vomiting. °· You stopped breastfeeding and have not had a menstrual period within 12 weeks of stopping. °· You are not breastfeeding and have not had a menstrual period within 12 weeks of delivery. °· You have a fever. °SEEK  IMMEDIATE MEDICAL CARE IF: °· You have persistent pain. °· You have chest pain. °· You have shortness of breath. °· You faint. °· You have leg pain. °· You have stomach pain. °· Your vaginal bleeding saturates 2 or more sanitary pads in 1 hour. °MAKE SURE YOU:  °· Understand these instructions. °· Will watch your condition. °· Will get help right away if you are not doing well or get worse. °Document Released: 12/19/2001 Document Revised: 11/29/2012 Document Reviewed: 11/24/2011 °[employer]® Patient Information ©2014 [employer]. ° ° ° °

## 2013-05-31 NOTE — Progress Notes (Signed)
Patient reports that she feels like someone is cutting her at night in her bottom. She feels very nervous and anxious today. She is not interested in birth control.

## 2013-05-31 NOTE — Progress Notes (Signed)
Patient ID: [name], female   DOB: 02/28/1976, 38 y.o.   MRN: [id number] Subjective:     [name] is a 38 y.o. female who presents for a postpartum visit. She is 7 weeks postpartum following a low cervical transverse Cesarean section. I have fully reviewed the prenatal and intrapartum course. The delivery was at 36.3 gestational weeks. Outcome: repeat cesarean section, low transverse incision. Anesthesia: epidural. Postpartum - patient has been seen for follow-up for a wound check previously. Today she states that she is doing better emotionally. She still has some occasional sharp pains in the vaginal area. Baby was stillborn after shoulder distocia.  Bleeding no bleeding. Bowel function is normal. Bladder function is normal. Patient is not sexually active. Contraception method is none. Postpartum depression screening: negative. Score : 7. Patient feels that she is doing better emotionally. She is unsure at this time if she is ready to resume sexual activity. She does not desire birth control at this time. She is unsure if she is ready to resume sexual activity.   The following portions of the patient's history were reviewed and updated as appropriate: allergies, current medications, past family history, past medical history, past social history, past surgical history and problem list.  Review of Systems Pertinent items are noted in HPI.   Objective:    BP 134/94  Pulse 99  Wt 137 lb 14.4 oz (62.551 kg)  General:  alert and cooperative   Breasts:  not performed  Lungs: clear to auscultation bilaterally  Heart:  regular rate and rhythm, S1, S2 normal, no murmur, click, rub or gallop  Abdomen: soft, non-tender; bowel sounds normal; no masses,  no organomegaly   Vulva:  normal contour. Well-healed.   Vagina:  pink and ruggatted  Cervix:   normal contour without lesions, scant mucus discharge noted  Corpus:  normal size and contour  Adnexa:   no masses or tenderness to palpatoin  Rectal Exam: Not  performed.          Assessment:     Normal postpartum exam. Pap smear not done at today's visit.   Plan:    1. Contraception: condoms 2. Referral to [facility] for management of DM 3. [facility] as needed.

## 2013-06-21 ENCOUNTER — Ambulatory Visit (INDEPENDENT_AMBULATORY_CARE_PROVIDER_SITE_OTHER): Payer: Medicaid Other | Admitting: Family Medicine

## 2013-06-21 ENCOUNTER — Encounter: Payer: Self-pay | Admitting: Family Medicine

## 2013-06-21 VITALS — BP 138/88 | HR 102 | Temp 98.5°F | Wt 143.0 lb

## 2013-06-21 DIAGNOSIS — E119 Type 2 diabetes mellitus without complications: Secondary | ICD-10-CM

## 2013-06-21 DIAGNOSIS — I10 Essential (primary) hypertension: Secondary | ICD-10-CM

## 2013-06-21 LAB — POCT GLYCOSYLATED HEMOGLOBIN (HGB A1C): Hemoglobin A1C: 6.1

## 2013-06-21 NOTE — Assessment & Plan Note (Addendum)
A: initially 157/96 and repeat 138/88; reporting increased stress in her life after death of her baby; was induced at 36weeks for pre-E and had an emergent LTCS with fetal demise; self-d/c enalapril as husband felt that she didn't need it any more P: restart enalapril for the next 2 weeks and f/up with nursing BP check RTC in 3 months

## 2013-06-21 NOTE — Patient Instructions (Addendum)
Ms [name], it was great to meet you today!  I am glad to hear that you are getting outside and enjoying this warmer weather. For your blood pressure I would like for you to restart your enalapril medication at 10mg  Please come back in for a nursing visit Blood pressure check in 2 weeks. At that time we can decide whether to continue your medication   For your blood sugars, continue to check your sugars daily. I would like for you to keep checking in the morning but sometimes maybe check a few times in the afternoon also This way we can tell whether your sugars are well controlled all day I will call you with results of your A1C.  I will see you back in 3 months time or sooner as needed! Charlane FerrettiMelanie C Emrie Gayle

## 2013-06-21 NOTE — Assessment & Plan Note (Addendum)
A: pt reporting CBGs ranging from 103-120s; denies >200 or <80; now taking metformin BID daily; last PP visit seems that there was a discussion about husband regulating her medications P: cont metformin BID daily A1C in lab

## 2013-06-21 NOTE — Progress Notes (Signed)
Patient ID: [name], female   DOB: 11-26-1975, 38 y.o.   MRN: [id number] [facility] Deanna FerrettiMelanie C Merion Grimaldo, MD Phone: [phone]  Subjective:   # DM -reports that has been taking metformin 1000mg  daily -attesting to 100% compliance, denies n/v/diarrhea; occasionally will have an upset stomach when she is nervous but otherwise denies abdominal discomfort -no numbness, tingling, recently saw the ophtho and was told vision was ok  #HTN - reports BPs in the 110s-120s range at home -was on enalapril 10mg s but self d/c about 1 week ago -has been stressed lately 2/2 death of her baby - no CP, palps, SOB  #HCM -reports that she has been trying to exercise since the weather has gotten warmer  -enjoys walking outside  All systems were reviewed and were negative unless otherwise noted in the HPI  Past Medical History Patient Active Problem List   Diagnosis Date Noted  . Post-operative state 04/10/2013  . Active labor 04/09/2013  . LGA (large for gestational age) fetus affecting management of mother 02/26/2013  . Supervision of high risk pregnancy in second trimester 11/13/2012  . DM type 2 (diabetes mellitus, type 2) 07/13/2011  . Type 2 diabetes in pregnancy 03/10/2009   Reviewed problem list.  Medications- reviewed and updated Chief complaint-noted No additions to family history Social history- patient is a never smoker  Objective: BP 138/88  Pulse 102  Temp(Src) 98.5 F (36.9 C) (Oral)  Wt 143 lb (64.864 kg) Gen: NAD, alert, cooperative with exam HEENT: NCAT, EOMI, PERRL, TMs nml Neck: FROM, supple CV: RRR, good S1/S2, no murmur, cap refill <3 Resp: CTABL, no wheezes, non-labored Abd: SNTND, BS present, no guarding or organomegaly; s/p CS with well healed scar Ext: No edema, warm, normal tone, moves UE/LE spontaneously Neuro: Alert and oriented, No gross deficits Skin: no rashes no lesions  Assessment/Plan: See problem based a/p

## 2013-11-29 ENCOUNTER — Encounter: Payer: Self-pay | Admitting: Family Medicine

## 2013-11-29 ENCOUNTER — Ambulatory Visit (INDEPENDENT_AMBULATORY_CARE_PROVIDER_SITE_OTHER): Payer: Medicaid Other | Admitting: Family Medicine

## 2013-11-29 VITALS — BP 140/86 | HR 99 | Ht 61.0 in | Wt 148.0 lb

## 2013-11-29 DIAGNOSIS — E119 Type 2 diabetes mellitus without complications: Secondary | ICD-10-CM

## 2013-11-29 DIAGNOSIS — I10 Essential (primary) hypertension: Secondary | ICD-10-CM

## 2013-11-29 LAB — LIPID PANEL
CHOL/HDL RATIO: 3.8 ratio
Cholesterol: 226 mg/dL — ABNORMAL HIGH (ref 0–200)
HDL: 59 mg/dL (ref >39)
LDL Cholesterol: 129 mg/dL — ABNORMAL HIGH (ref 0–99)
Triglycerides: 192 mg/dL — ABNORMAL HIGH (ref <150)
VLDL: 38 mg/dL (ref 0–40)

## 2013-11-29 LAB — POCT GLYCOSYLATED HEMOGLOBIN (HGB A1C): Hemoglobin A1C: 8.5

## 2013-11-29 MED ORDER — METFORMIN HCL 1000 MG PO TABS: 1000.0000 mg | ORAL_TABLET | Freq: Two times a day (BID) | ORAL | Status: DC

## 2013-11-29 MED ORDER — GLIPIZIDE 2.5 MG HALF TABLET: 2.5000 mg | ORAL_TABLET | Freq: Every day | ORAL | Status: DC

## 2013-11-29 NOTE — Progress Notes (Signed)
Patient ID: [name], female   DOB: June 24, 1975, 38 y.o.   MRN: [id number]   [facility] Deanna FerrettiMelanie C Tamotsu Wiederholt, MD Phone: [phone]  Subjective:  [name] is a 38 y.o F with hx of gestational DM and pre-E   # DIABETES Disease Monitoring:  Blood Sugar ranges-checks every morning 160-170 Polyuria/phagia/dipsia- none      Visual problems- none; checked last in February   Medications: Compliance- metformin 1000BID; forgets 2-3 days Hypoglycemic symptoms- sometimes when she is not eating; feels better after eating   #HTN HYPERTENSION Disease Monitoring: Blood pressure range-does not take at home; 128/72 Chest pain, palpitations- none     Dyspnea- SOB when sitting, standing or walk  Medications: not taking? Compliance- has been out of medicine for a while Lightheadedness,Syncope- none  Edema- none  All relevant systems were reviewed and were negative unless otherwise noted in the HPI  Past Medical History Patient Active Problem List   Diagnosis Date Noted  . HTN (hypertension) 06/21/2013  . Post-operative state 04/10/2013  . Active labor 04/09/2013  . LGA (large for gestational age) fetus affecting management of mother 02/26/2013  . Supervision of high risk pregnancy in second trimester 11/13/2012  . DM type 2 (diabetes mellitus, type 2) 07/13/2011  . Type 2 diabetes in pregnancy 03/10/2009   Reviewed problem list.  Medications- reviewed and updated Chief complaint-noted No additions to family history Social history- patient is a never smoker  Objective: BP 150/95  Pulse 99  Ht 5\' 1"  (1.549 m)  Wt 148 lb (67.132 kg)  BMI 27.98 kg/m2 Gen: NAD, alert, cooperative with exam HEENT: NCAT, EOMI, PERRL Neck: FROM, supple CV: RRR, good S1/S2, no murmur, cap refill <3 Resp: CTABL, no wheezes, non-labored Ext: No edema, warm, normal tone, moves UE/LE spontaneously Neuro: Alert and oriented, No gross deficits Skin: no rashes no lesions  Assessment/Plan: See  problem based a/p

## 2013-11-29 NOTE — Assessment & Plan Note (Signed)
Worsening Adding glip to metformin Pt to take am CBGs and hold for hypoglycemia Concern in the past about husband controlling her medications (wonder if SOB is related to stress?) Will start with carb counting RTC in 1-3 months

## 2013-11-29 NOTE — Assessment & Plan Note (Signed)
Initially 150s systolic and then 140s on repeat; I don't thin she is taking enalapril as husband controls her medications Has a lot of self reported anxiety about coming to clinic Will hold for now LDL levels Pt to cont home monitoring for goal <140s

## 2013-11-29 NOTE — Patient Instructions (Signed)
[name] it was great to see you today!  I am pleased to hear that things are going well for you.  Please let us know if the breathing symptoms do not go away in the next few days  Continue to take the metformin every day Please also add glipizide 2.5mg  daily  See the following for food ideas for limiting carbohydrates  Looking forward to seeing you soon Make appointment for 3 months Deanna Ferretti, MD  Basic Carbohydrate Counting for Diabetes Mellitus Carbohydrate counting is a method for keeping track of the amount of carbohydrates you eat. Eating carbohydrates naturally increases the level of sugar (glucose) in your blood, so it is important for you to know the amount that is okay for you to have in every meal. Carbohydrate counting helps keep the level of glucose in your blood within normal limits. The amount of carbohydrates allowed is different for every person. A dietitian can help you calculate the amount that is right for you. Once you know the amount of carbohydrates you can have, you can count the carbohydrates in the foods you want to eat. Carbohydrates are found in the following foods:  Grains, such as breads and cereals.  Dried beans and soy products.  Starchy vegetables, such as potatoes, peas, and corn.  Fruit and fruit juices.  Milk and yogurt.  Sweets and snack foods, such as cake, cookies, candy, chips, soft drinks, and fruit drinks. CARBOHYDRATE COUNTING There are two ways to count the carbohydrates in your food. You can use either of the methods or a combination of both. Reading the "Nutrition Facts" on Packaged Food The "Nutrition Facts" is an area that is included on the labels of almost all packaged food and beverages in the [place]. It includes the serving size of that food or beverage and information about the nutrients in each serving of the food, including the grams (g) of carbohydrate per serving.  Decide the number of servings of this food or  beverage that you will be able to eat or drink. Multiply that number of servings by the number of grams of carbohydrate that is listed on the label for that serving. The total will be the amount of carbohydrates you will be having when you eat or drink this food or beverage. Learning Standard Serving Sizes of Food When you eat food that is not packaged or does not include "Nutrition Facts" on the label, you need to measure the servings in order to count the amount of carbohydrates.A serving of most carbohydrate-rich foods contains about 15 g of carbohydrates. The following list includes serving sizes of carbohydrate-rich foods that provide 15 g ofcarbohydrate per serving:   1 slice of bread (1 oz) or 1 six-inch tortilla.    of a hamburger bun or English muffin.  4-6 crackers.   cup unsweetened dry cereal.    cup hot cereal.   cup rice or pasta.    cup mashed potatoes or  of a large baked potato.  1 cup fresh fruit or one small piece of fruit.    cup canned or frozen fruit or fruit juice.  1 cup milk.   cup plain fat-free yogurt or yogurt sweetened with artificial sweeteners.   cup cooked dried beans or starchy vegetable, such as peas, corn, or potatoes.  Decide the number of standard-size servings that you will eat. Multiply that number of servings by 15 (the grams of carbohydrates in that serving). For example, if you eat 2 cups of  strawberries, you will have eaten 2 servings and 30 g of carbohydrates (2 servings x 15 g = 30 g). For foods such as soups and casseroles, in which more than one food is mixed in, you will need to count the carbohydrates in each food that is included. EXAMPLE OF CARBOHYDRATE COUNTING Sample Dinner  3 oz chicken breast.   cup of brown rice.   cup of corn.  1 cup milk.   1 cup strawberries with sugar-free whipped topping.  Carbohydrate Calculation Step 1: Identify the foods that contain carbohydrates:   Rice.   Corn.    Milk.   Strawberries. Step 2:Calculate the number of servings eaten of each:   2 servings of rice.   1 serving of corn.   1 serving of milk.   1 serving of strawberries. Step 3: Multiply each of those number of servings by 15 g:   2 servings of rice x 15 g = 30 g.   1 serving of corn x 15 g = 15 g.   1 serving of milk x 15 g = 15 g.   1 serving of strawberries x 15 g = 15 g. Step 4: Add together all of the amounts to find the total grams of carbohydrates eaten: 30 g + 15 g + 15 g + 15 g = 75 g. Document Released: 03/29/2005 Document Revised: 08/13/2013 Document Reviewed: 02/23/2013 [facility] Patient Information 2015 [employer], [place]. This information is not intended to replace advice given to you by your health care provider. Make sure you discuss any questions you have with your health care provider.

## 2013-11-30 ENCOUNTER — Telehealth: Payer: Self-pay | Admitting: Family Medicine

## 2013-11-30 DIAGNOSIS — E785 Hyperlipidemia, unspecified: Secondary | ICD-10-CM

## 2013-11-30 NOTE — Telephone Encounter (Signed)
Elevated LDL, as I unfortunately suspected Could start on statin (but pt very hesitant to take medications) May be able to try with dietary modification alone for now Could we discuss, thx [facility]

## 2013-12-03 NOTE — Telephone Encounter (Signed)
LMOVM for pt to return call.  Please see below message.  I have also mailed her some information on a low cholesterol diet. Fleeger, Maryjo Rochester

## 2013-12-03 NOTE — Telephone Encounter (Signed)
Pt calls back with the help of an interpreter. Pt is agreeable with starting statin medication. Will await low cholesterol diet paperwork. Pls call once meds are sent to pharmacy.

## 2013-12-04 MED ORDER — ATORVASTATIN CALCIUM 20 MG PO TABS: 20.0000 mg | ORAL_TABLET | Freq: Every day | ORAL | Status: DC

## 2013-12-04 NOTE — Telephone Encounter (Signed)
Patient will need to be careful taking medication if she plans to get pregnant again. Drug is category X in pregnancy. I will call in for now but have a low threshold for stopping if she considers getting pregnant in the near future. [facility], MD Will send letter to pt with low cholesterol diet.

## 2013-12-05 NOTE — Telephone Encounter (Signed)
Pt informed. [name], [name]  

## 2013-12-29 ENCOUNTER — Other Ambulatory Visit (HOSPITAL_COMMUNITY): Payer: Self-pay | Admitting: Obstetrics and Gynecology

## 2014-02-04 ENCOUNTER — Other Ambulatory Visit: Payer: Self-pay | Admitting: Obstetrics & Gynecology

## 2014-02-04 DIAGNOSIS — O24112 Pre-existing diabetes mellitus, type 2, in pregnancy, second trimester: Secondary | ICD-10-CM

## 2014-02-11 ENCOUNTER — Encounter: Payer: Self-pay | Admitting: Family Medicine

## 2014-02-11 ENCOUNTER — Other Ambulatory Visit: Payer: Self-pay | Admitting: Family Medicine

## 2014-03-18 ENCOUNTER — Ambulatory Visit (INDEPENDENT_AMBULATORY_CARE_PROVIDER_SITE_OTHER): Payer: Medicaid Other | Admitting: *Deleted

## 2014-03-18 ENCOUNTER — Ambulatory Visit (INDEPENDENT_AMBULATORY_CARE_PROVIDER_SITE_OTHER): Payer: Medicaid Other | Admitting: Family Medicine

## 2014-03-18 VITALS — BP 133/91 | HR 87 | Temp 98.5°F | Wt 148.4 lb

## 2014-03-18 DIAGNOSIS — E119 Type 2 diabetes mellitus without complications: Secondary | ICD-10-CM

## 2014-03-18 DIAGNOSIS — Z23 Encounter for immunization: Secondary | ICD-10-CM

## 2014-03-18 DIAGNOSIS — O24112 Pre-existing diabetes mellitus, type 2, in pregnancy, second trimester: Secondary | ICD-10-CM

## 2014-03-18 DIAGNOSIS — I10 Essential (primary) hypertension: Secondary | ICD-10-CM

## 2014-03-18 LAB — POCT GLYCOSYLATED HEMOGLOBIN (HGB A1C): HEMOGLOBIN A1C: 10.2

## 2014-03-18 MED ORDER — GLUCOSE BLOOD VI STRP: 1.0000 | ORAL_STRIP | Status: DC

## 2014-03-18 MED ORDER — SIMVASTATIN 80 MG PO TABS: 80.0000 mg | ORAL_TABLET | Freq: Every day | ORAL | Status: DC

## 2014-03-18 MED ORDER — METFORMIN HCL 1000 MG PO TABS: 1000.0000 mg | ORAL_TABLET | Freq: Two times a day (BID) | ORAL | Status: DC

## 2014-03-18 MED ORDER — GLIPIZIDE 5 MG PO TABS: 5.0000 mg | ORAL_TABLET | Freq: Every day | ORAL | Status: DC

## 2014-03-18 NOTE — Assessment & Plan Note (Signed)
Worsening A1c 2/2 pharmacy not being willing to fill rx Also confusion regarding glipizide Ok foot exam today To f/up on eyes in 1 month Microalbumin today Cont metformin 1000BID and increase glip to whole pill of 5mg  Ator until completes bottle and then switch to cheaper zocor until insurance kicks in

## 2014-03-18 NOTE — Assessment & Plan Note (Signed)
At goal currently Off medication Cont current therapy of DM management and DASH diet

## 2014-03-18 NOTE — Patient Instructions (Signed)
It was good to see you today  Please continue to take metformin 1000mg  twice a day Also take glipizide a WHOLE pill daily Keep taking atorvastatin until you run out, once you run out you can take a [facility] prescription [facility] or the health department  I will see you back in 3 months  Charlane FerrettiMelanie C Joron Velis, MD

## 2014-03-18 NOTE — Progress Notes (Signed)
Patient ID: [name], female   DOB: [birth year], 38 y.o.   MRN: [id number]   [name] [name] C Deanna Santiago, MD Phone: [phone]  Subjective:  Ms [name] is a 38 y.o F with hx of gestational DM and pre-E   # DIABETES Disease Monitoring:   Blood Sugar ranges-checks every morning  200s without metformin; polydipsia- none      Visual problems- some when out of metformin   Medications: metformin 1000BID; glipizide 5mg X 1 tab (instead of 1/2) Compliance-  forgets 2-3 days Hypoglycemic symptoms- none; nothing less than 150  #HTN HYPERTENSION Disease Monitoring: Blood pressure range-does not take at home; 130s/90s here Chest pain, palpitations- none     Dyspnea- SOB when sitting, standing or walk  Medications: not taking Compliance- n/a Lightheadedness,Syncope- none  Edema- none  All relevant systems were reviewed and were negative unless otherwise noted in the HPI  Past Medical History Patient Active Problem List   Diagnosis Date Noted  . HTN (hypertension) 06/21/2013  . Post-operative state 04/10/2013  . Active labor 04/09/2013  . LGA (large for gestational age) fetus affecting management of mother 02/26/2013  . Supervision of high risk pregnancy in second trimester 11/13/2012  . DM type 2 (diabetes mellitus, type 2) 07/13/2011  . Type 2 diabetes in pregnancy 03/10/2009   Reviewed problem list.  Medications- reviewed and updated Chief complaint-noted No additions to family history Social history- patient is a never smoker  Objective: BP 133/91 mmHg  Pulse 87  Temp(Src) 98.5 F (36.9 C) (Oral)  Wt 148 lb 6.4 oz (67.314 kg) Gen: NAD, alert, cooperative with exam Ext: No edema, warm, normal tone, moves UE/LE spontaneously Neuro: Alert and oriented, No gross deficits Skin: no rashes no lesions *see diabetic foot exam*  Assessment/Plan: See problem based a/p

## 2014-03-19 ENCOUNTER — Telehealth: Payer: Self-pay | Admitting: Family Medicine

## 2014-03-19 LAB — MICROALBUMIN / CREATININE URINE RATIO
CREATININE, URINE: 189.5 mg/dL
MICROALB UR: 247.4 mg/dL — AB (ref <2.0)
Microalb Creat Ratio: 1305.5 mg/g — ABNORMAL HIGH (ref 0.0–30.0)

## 2014-03-19 MED ORDER — ENALAPRIL MALEATE 2.5 MG PO TABS: 2.5000 mg | ORAL_TABLET | Freq: Every day | ORAL | Status: DC

## 2014-03-19 NOTE — Telephone Encounter (Signed)
Needs to restart enalapril for kidney protection given abnormal urine test Will recheck urine again in a few weeks [facility], MD

## 2014-03-19 NOTE — Telephone Encounter (Signed)
LMOVM for pt to return call .Fleeger, Jessica Dawn  

## 2014-05-15 NOTE — Progress Notes (Signed)
A1C and urine microalbumin ordered with diagnosis of type 2 diabetes without complication. Add diagnosis code of hypertension.   Donnella ShamKyle Fletke MD

## 2014-08-26 ENCOUNTER — Encounter: Payer: Self-pay | Admitting: Family Medicine

## 2014-08-26 ENCOUNTER — Ambulatory Visit (INDEPENDENT_AMBULATORY_CARE_PROVIDER_SITE_OTHER): Payer: Self-pay | Admitting: Family Medicine

## 2014-08-26 VITALS — BP 142/98 | HR 96 | Temp 98.3°F | Wt 154.0 lb

## 2014-08-26 DIAGNOSIS — I10 Essential (primary) hypertension: Secondary | ICD-10-CM

## 2014-08-26 DIAGNOSIS — E119 Type 2 diabetes mellitus without complications: Secondary | ICD-10-CM

## 2014-08-26 DIAGNOSIS — E785 Hyperlipidemia, unspecified: Secondary | ICD-10-CM | POA: Insufficient documentation

## 2014-08-26 DIAGNOSIS — R809 Proteinuria, unspecified: Secondary | ICD-10-CM

## 2014-08-26 LAB — BASIC METABOLIC PANEL
BUN: 10 mg/dL (ref 6–23)
CALCIUM: 8.7 mg/dL (ref 8.4–10.5)
CO2: 23 mEq/L (ref 19–32)
CREATININE: 0.52 mg/dL (ref 0.50–1.10)
Chloride: 100 mEq/L (ref 96–112)
Glucose, Bld: 188 mg/dL — ABNORMAL HIGH (ref 70–99)
Potassium: 4.1 mEq/L (ref 3.5–5.3)
Sodium: 139 mEq/L (ref 135–145)

## 2014-08-26 LAB — POCT GLYCOSYLATED HEMOGLOBIN (HGB A1C): Hemoglobin A1C: 8.3

## 2014-08-26 MED ORDER — AMLODIPINE BESYLATE 5 MG PO TABS: 5.0000 mg | ORAL_TABLET | Freq: Every day | ORAL | Status: DC

## 2014-08-26 NOTE — Assessment & Plan Note (Signed)
Discontinue Zocor as patient is planning to get pregnant Resume statin postpartum

## 2014-08-26 NOTE — Assessment & Plan Note (Signed)
Currently uncontrolled the patient has not been taking any medications for 2 months Stop enalapril as patient is planning to get pregnant Start Norvasc 5 mg daily Follow-up in 2 weeks for RN BP check

## 2014-08-26 NOTE — Patient Instructions (Signed)
Nice to meet you today. Your blood sugars are a lot better than they used to be. Continue taking the metformin and glipizide. Once you know you are pregnant, you can stop taking the metformin and call the office.  For your blood pressure, stop taking the enalapril and start taking Norvasc daily. Stop taking the simvastatin for your cholesterol.  Please come back for a nurse visit for blood pressure check in 2 weeks.  I'm getting some labs today and someone will call you or send you a letter with the results soon.  Take care, Dr. BLeonard Schwartz

## 2014-08-26 NOTE — Progress Notes (Signed)
   Subjective:   [name] is a 39 y.o. female with a history of T2DM, HTN, HLD here for chronic disease management.  Patient reports that she and her husband are trying to get pregnant again and wants to know which medications she can continue taking.  T2DM: - Taking metformin 1000mg  BID, glipizide 5mg  daily  - checking CBGs daily: runs 130s-150s - Compliance: rarely misses AM dose (<1x/wk) - medication SEs: none - No polyuria/polydipsia - no symptoms of hypoglycemia  HTN: - Stopped taking enalipril 2 months ago b/c thinking about getting pregnant soon - h/o microalbuminuria - No CP, SOB, HAs, vision changes, LE edema  HLD - stopped taking simvastatin because it was too expensive (lost medicaid in December) and b/c she may get pregnant soon   Review of Systems:  Per HPI. All other systems reviewed and are negative.   PMH, PSH, Medications, Allergies, and FmHx reviewed and updated in EMR.  Social History: never smoker  Objective:  BP 147/91 mmHg  Pulse 96  Temp(Src) 98.3 F (36.8 C) (Oral)  Wt 154 lb (69.854 kg)  Gen:  39 y.o. female in NAD HEENT: NCAT, MMM, EOMI, PERRL, anicteric sclerae CV: RRR, no MRG, 2+ DP pulses b/l Resp: Non-labored, CTAB, no wheezes noted Ext: WWP, no edema Neuro: Alert and oriented, speech normal      Chemistry      Component Value Date/Time   NA 139 04/16/2013 1543   K 3.5* 04/16/2013 1543   CL 101 04/16/2013 1543   CO2 24 04/16/2013 1543   BUN 12 04/16/2013 1543   CREATININE 0.57 04/16/2013 1543   CREATININE 0.60 04/02/2013 1123   CREATININE 0.48* 02/15/2013 1228      Component Value Date/Time   CALCIUM 8.9 04/16/2013 1543   ALKPHOS 107 04/16/2013 1543   AST 17 04/16/2013 1543   ALT 15 04/16/2013 1543   BILITOT 0.3 04/16/2013 1543      Lab Results  Component Value Date   HGBA1C 8.3 08/26/2014   Assessment:     [name] is a 39 y.o. female here for T2 DM, HTN, HLD follow-up in the setting of planning to become pregnant in  the near future.    Plan:     See problem list for problem-specific plans.   Erasmo DownerAngela M Inge Waldroup, MD PGY-1,  [facility] 08/26/2014  8:37 AM

## 2014-08-26 NOTE — Progress Notes (Signed)
I was preceptor the day of this visit.   

## 2014-08-26 NOTE — Assessment & Plan Note (Signed)
Unable to take ace inhibitor or ARB as patient planning to get pregnant Resume enalapril 2.5 mg daily postpartum course patient decided she no longer wishes to become pregnant

## 2014-08-26 NOTE — Assessment & Plan Note (Signed)
A1c improving but still above goal Continue metformin 1000 mg twice a day and glipizide 5 mg daily Discontinue metformin when patient finds that she is pregnant Foot exam at next visit Follow-up in 3 months

## 2014-08-28 ENCOUNTER — Encounter: Payer: Self-pay | Admitting: Family Medicine

## 2014-09-01 IMAGING — US US OB FOLLOW-UP
1 series · 12 of 28 positions shown · non-contrast
Comparison: none

[Series 1: us ob follow-up · 12 of 32 slices shown]
[im 2/32]
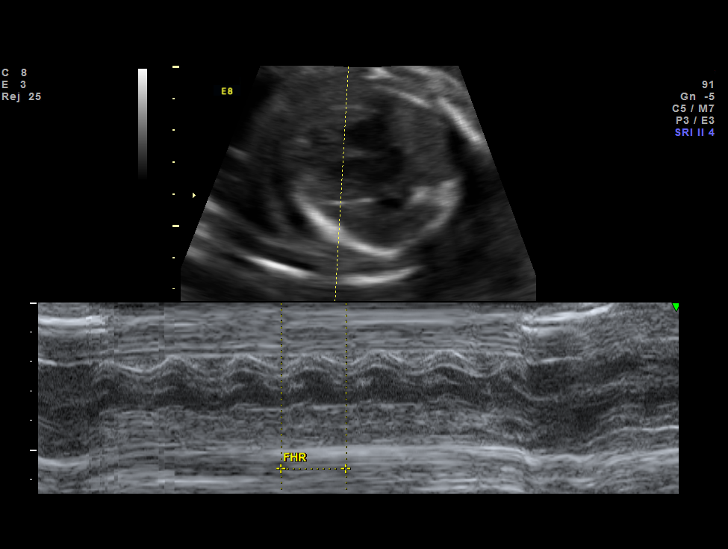
[im 4/32]
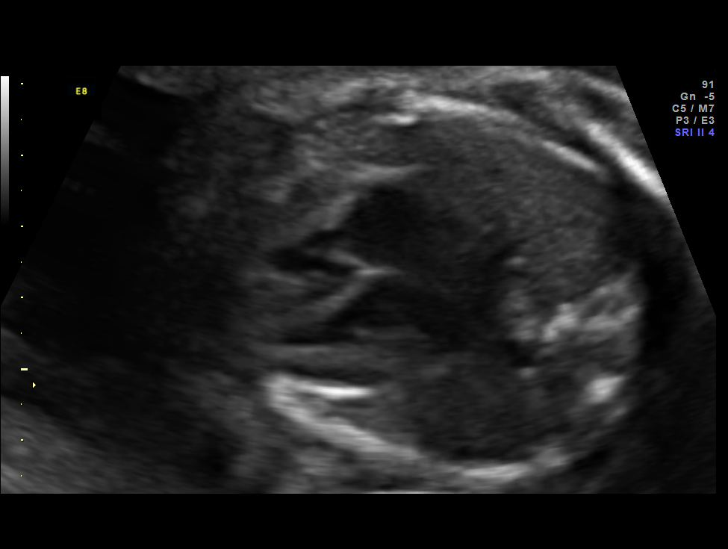
[im 6/32]
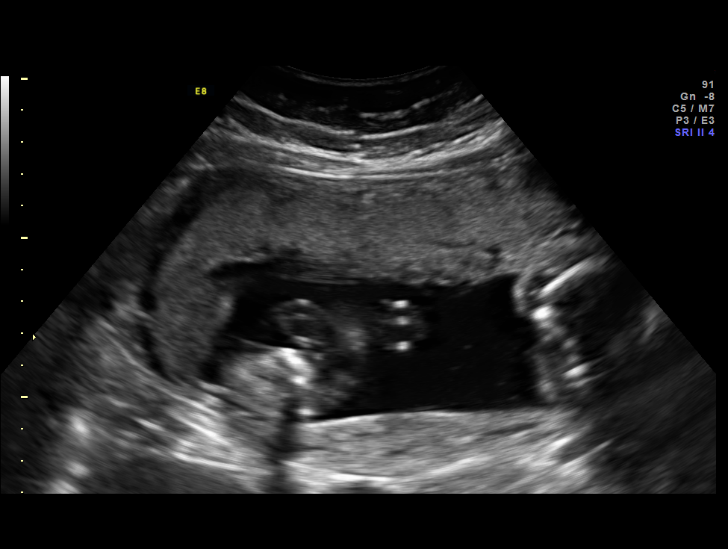
[im 10/32]
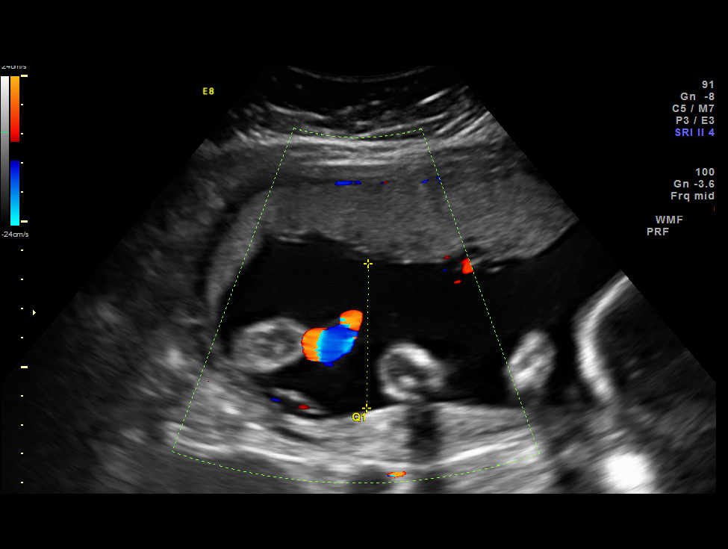
[im 12/32]
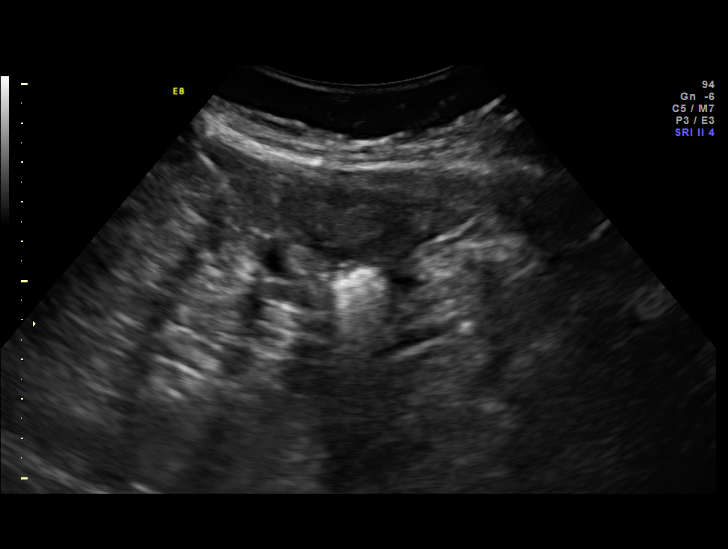
[im 14/32]
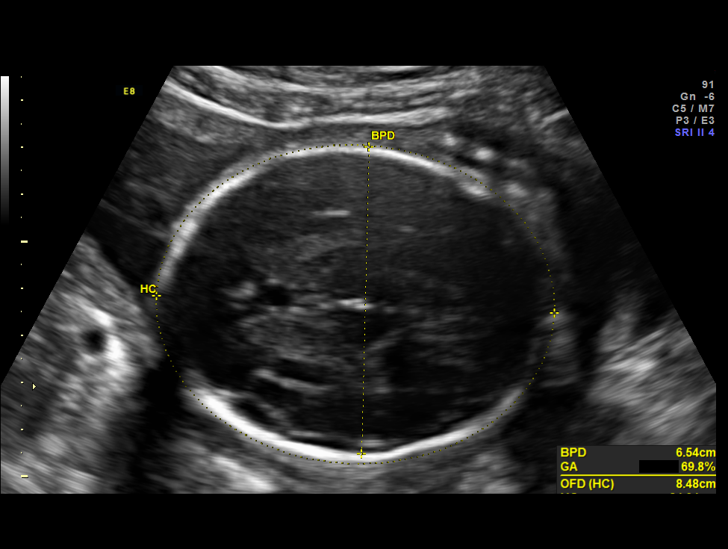
[im 18/32]
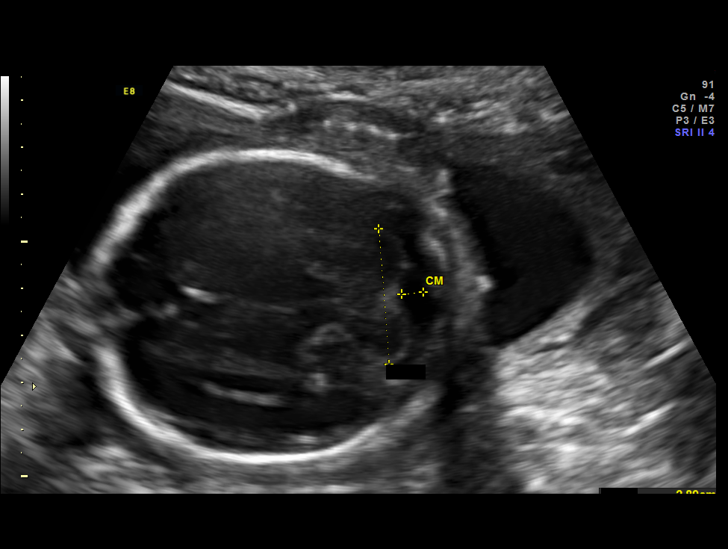
[im 20/32]
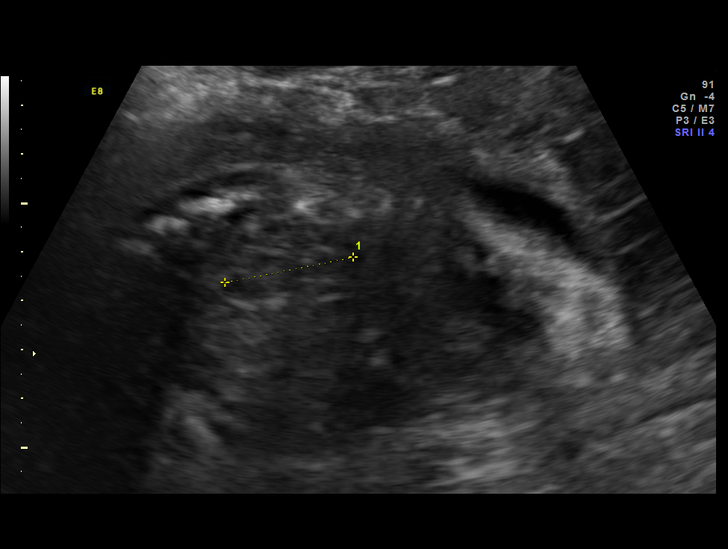
[im 22/32]
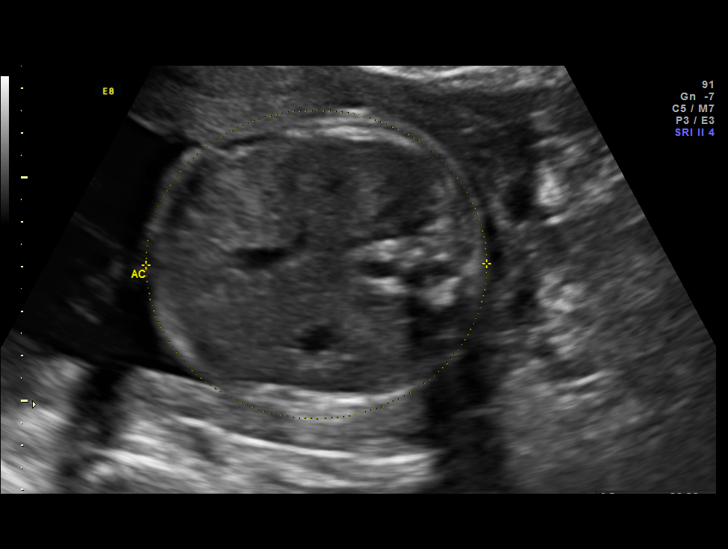
[im 26/32]
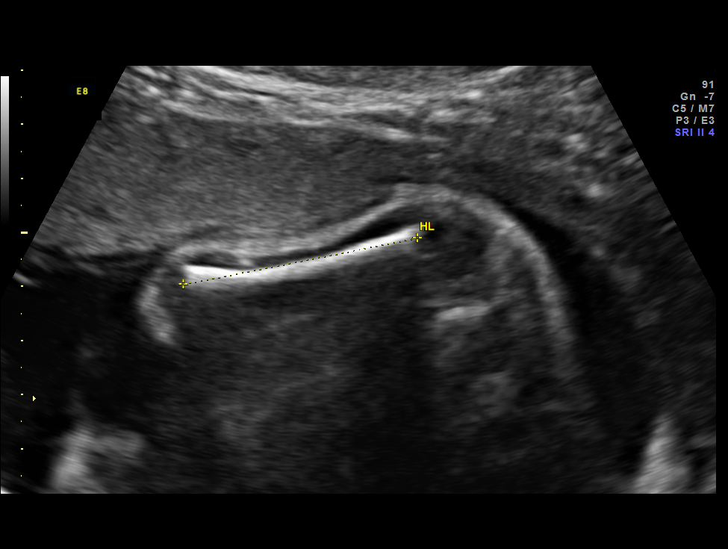
[im 28/32]
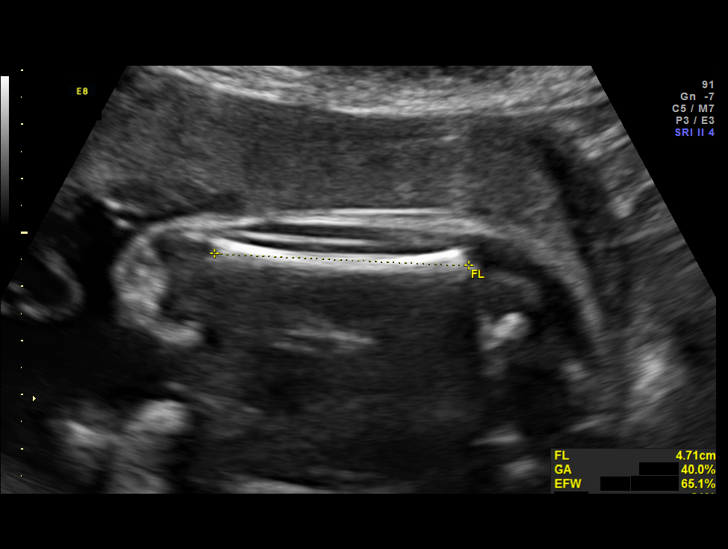
[im 30/32]
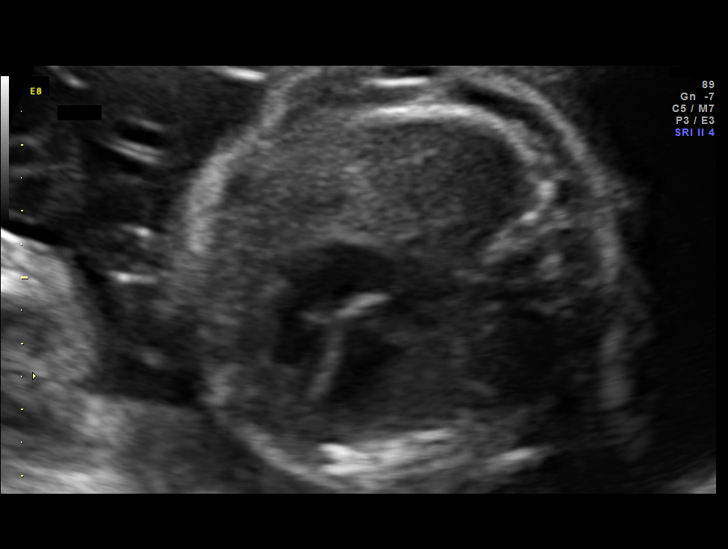

[12 of 28 positions shown; findings below may reference images not displayed]

OBSTETRICS REPORT
                      (Signed Final 01/24/2013 [DATE])

Service(s) Provided

 US OB FOLLOW UP                                       76816.1
Indications

 Abnormal biochemical screen (quad) for Trisomy
 18 ([DATE]) - low risk NIPS
 Advanced maternal age (AMA), Multigravida
 Diabetes - Gestational, A2 (glyburide)
 Poor obstetric history: Previous gestational
 diabetes
 Normal fetal ECHO
Fetal Evaluation

 Num Of Fetuses:    1
 Fetal Heart Rate:  155                          bpm
 Cardiac Activity:  Observed
 Presentation:      Cephalic
 Placenta:          Anterior, above cervical os
 P. Cord            Previously Visualized
 Insertion:

 Amniotic Fluid
 AFI FV:      Subjectively within normal limits
                                             Larg Pckt:     4.9  cm
Biometry

 BPD:     65.3  mm     G. Age:  26w 3d                CI:         76.2   70 - 86
 OFD:     85.7  mm                                    FL/HC:      19.8   18.6 -

 HC:     242.4  mm     G. Age:  26w 2d       54  %    HC/AC:      1.07   1.04 -

 AC:     227.3  mm     G. Age:  27w 1d       84  %    FL/BPD:     73.4   71 - 87
 FL:      47.9  mm     G. Age:  26w 0d       51  %    FL/AC:      21.1   20 - 24
 HUM:     44.7  mm     G. Age:  26w 4d       68  %
 CER:     28.9  mm     G. Age:  25w 6d       54  %

 Est. FW:     960  gm      2 lb 2 oz     74  %
Gestational Age

 LMP:           26w 6d        Date:  07/20/12                 EDD:   04/26/13
 U/S Today:     26w 3d                                        EDD:   04/29/13
 Best:          25w 4d     Det. By:  U/S (11/28/12)           EDD:   05/05/13
Anatomy

 Cranium:          Appears normal         Aortic Arch:      Previously seen
 Fetal Cavum:      Appears normal         Ductal Arch:      Previously seen
 Ventricles:       Appears normal         Diaphragm:        Previously seen
 Choroid Plexus:   Previously seen        Stomach:          Appears normal, left
                                                            sided
 Cerebellum:       Appears normal         Abdomen:          Appears normal
 Posterior Fossa:  Appears normal         Abdominal Wall:   Previously seen
 Nuchal Fold:      Previously seen        Cord Vessels:     Previously seen
 Face:             Orbits and profile     Kidneys:          Appear normal
                   previously seen
 Lips:             Previously seen        Bladder:          Appears normal
 Heart:            Appears normal         Spine:            Previously seen
                   (4CH, axis, and
                   situs)
 RVOT:             Appears normal         Lower             Previously seen
                                          Extremities:
 LVOT:             Not well visualized    Upper             Previously seen
                                          Extremities:

 Other:  Fetus appears to be a male. Right 5th digit previously visualized.
         Nasal bone previously visualized. Technically difficult due to fetal
         position.
Targeted Anatomy

 Fetal Central Nervous System
 Cisterna Magna:
Cervix Uterus Adnexa

 Cervical Length:    3.2      cm

 Cervix:       Normal appearance by transabdominal scan.

 Left Ovary:    Within normal limits.
 Right Ovary:   Within normal limits.
 Adnexa:     No abnormality visualized.
Impression

 Single living intrauterine pregnancy at 25 weeks 4 days.
 Appropriate interval fetal growth (74%).
 Normal amniotic fluid volume.
 Normal interval fetal anatomy.
Recommendations

 Recommend follow-up ultrasound examination in 4 weeks.

 questions or concerns.
                Levitan, Rosalba

## 2014-09-29 IMAGING — US US OB FOLLOW-UP
1 series · 12 of 28 positions shown · non-contrast
Comparison: none

[Series 1: us ob follow-up · 0.09mm/px · 12 of 37 slices shown]
[im 2/37]
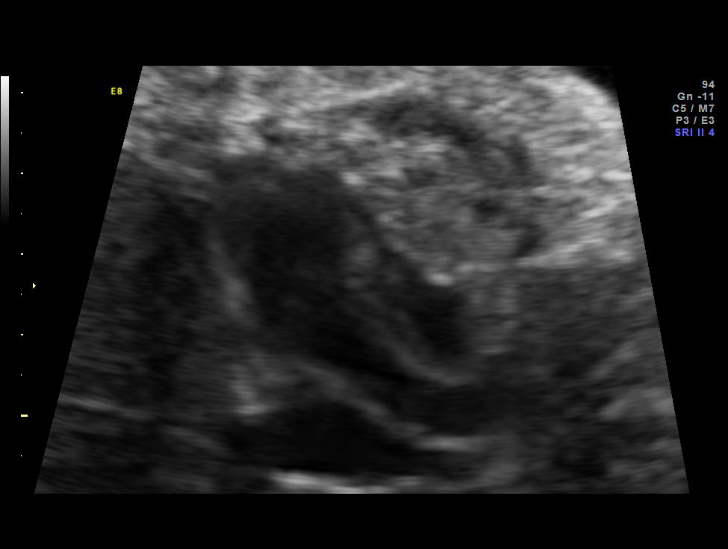
[im 5/37]
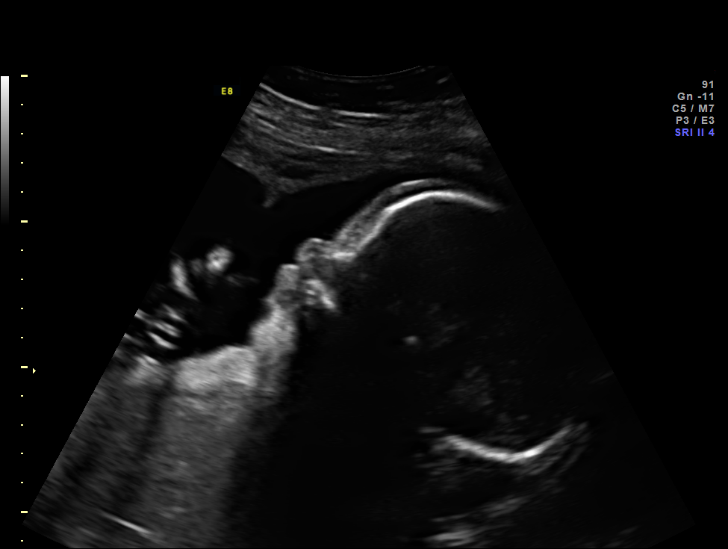
[im 7/37]
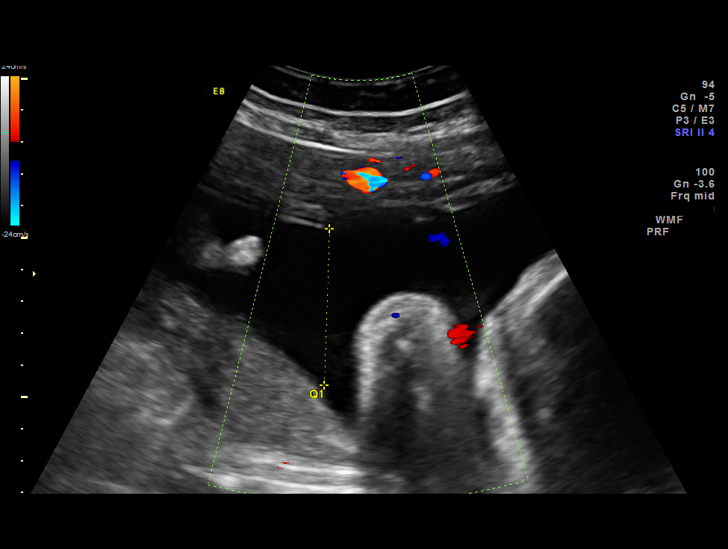
[im 11/37]
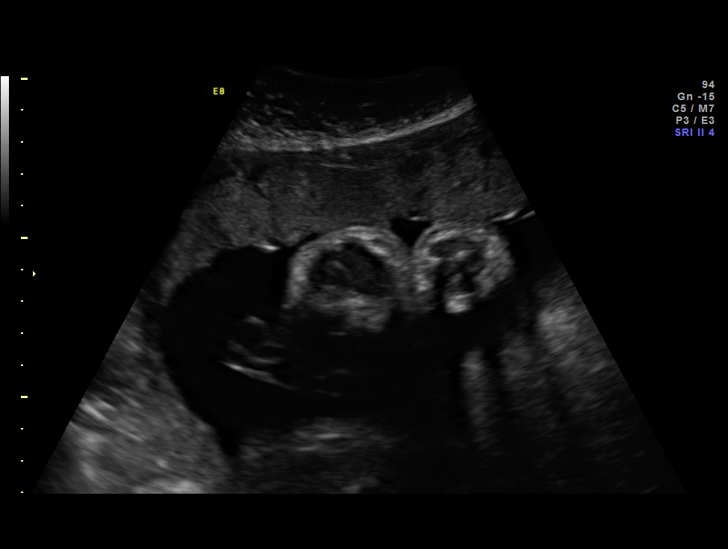
[im 14/37]
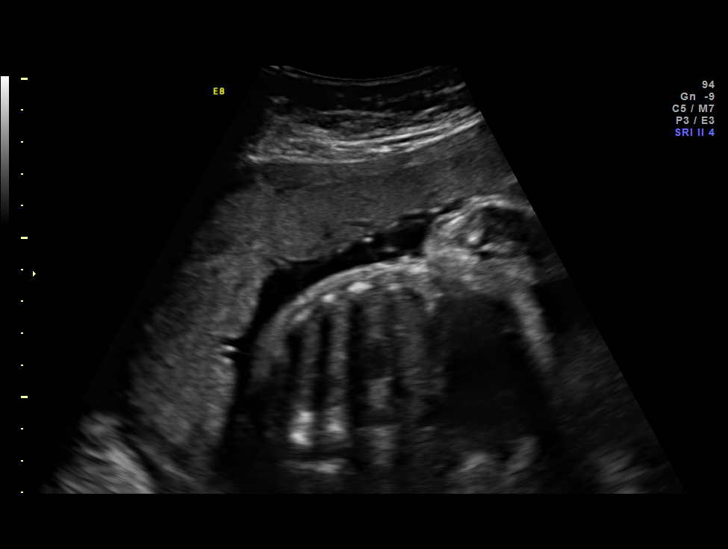
[im 17/37]
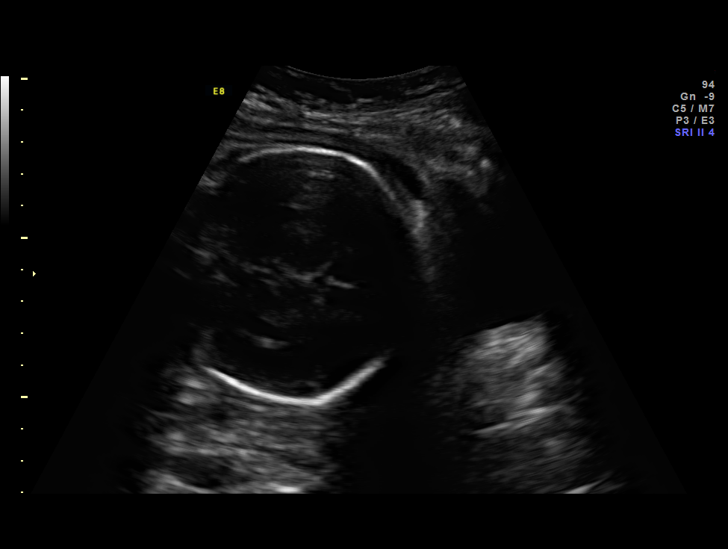
[im 21/37]
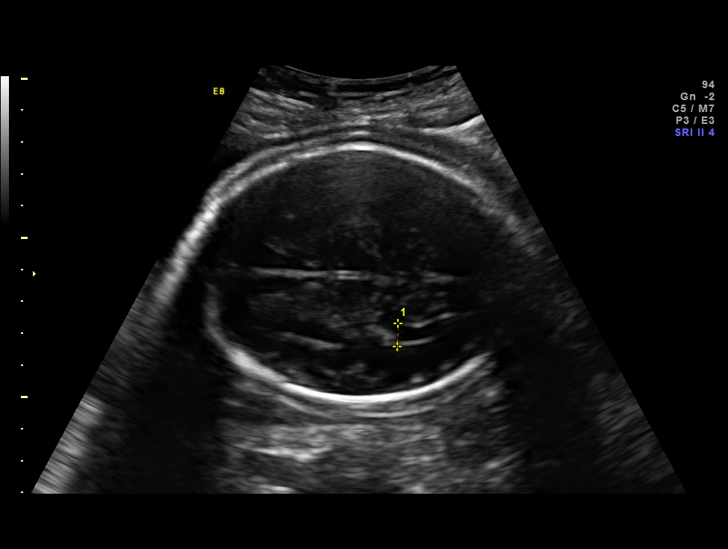
[im 23/37]
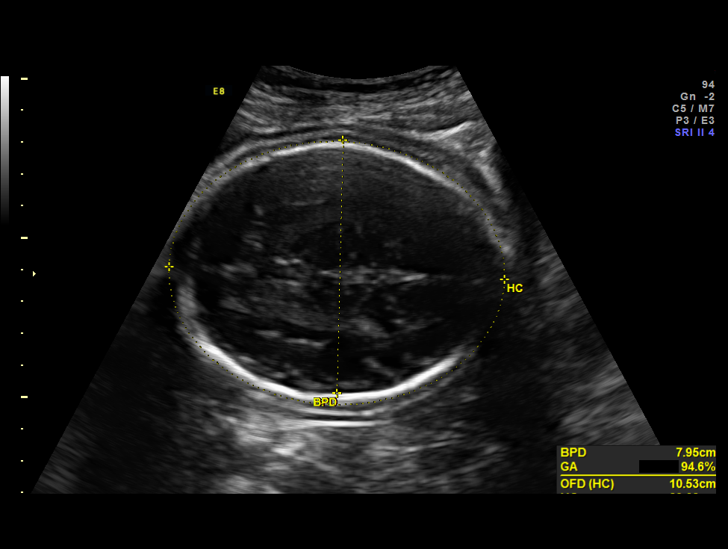
[im 26/37]
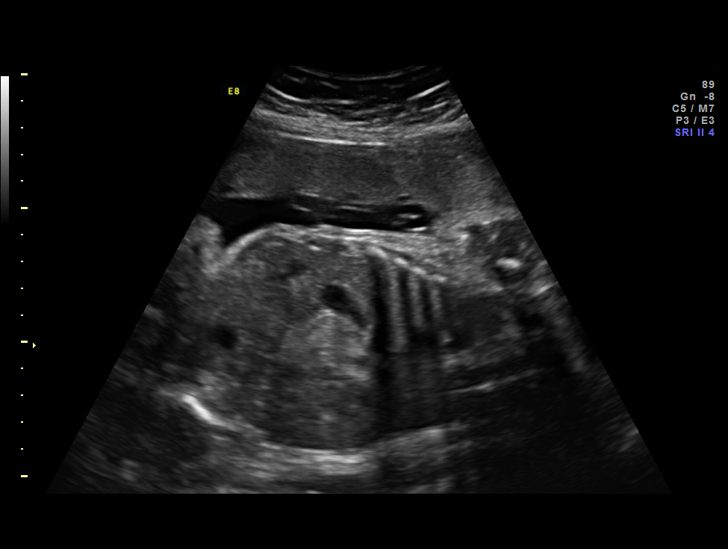
[im 30/37]
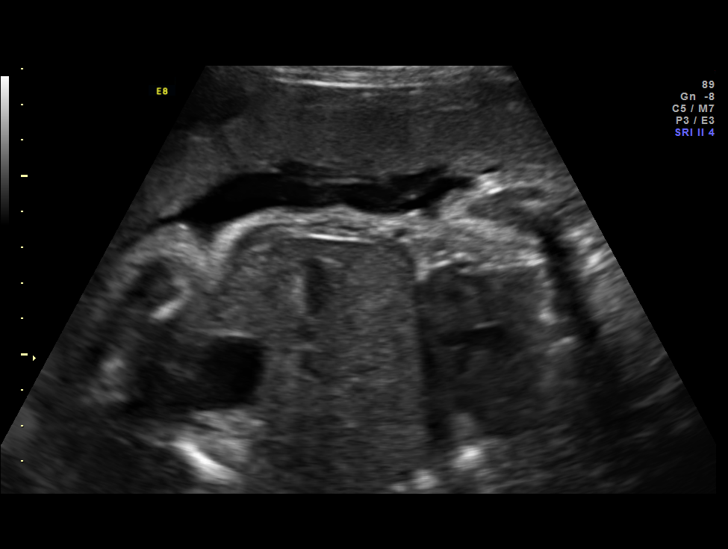
[im 33/37]
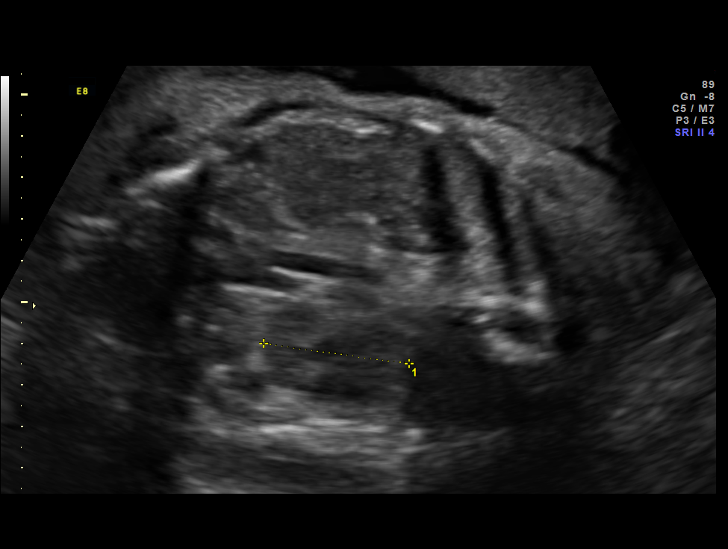
[im 35/37]
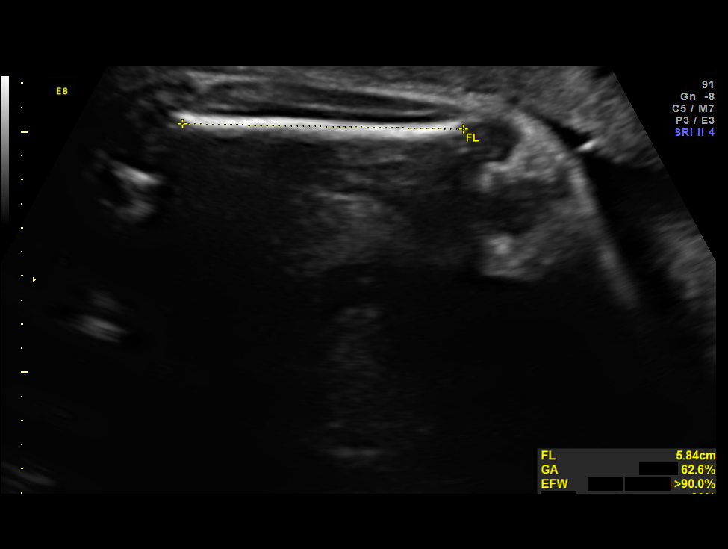

[12 of 28 positions shown; findings below may reference images not displayed]

OBSTETRICS REPORT
                      (Signed Final 02/21/2013 [DATE])

Service(s) Provided

 US OB FOLLOW UP                                       76816.1
Indications

 Abnormal biochemical screen (quad) for Trisomy
 18 ([DATE]) - low risk NIPS
 Advanced maternal age (36), Multigravida
 Poor obstetric history: Previous gestational
 diabetes; normal ECHO
 Elevated Blood Pressure
 Diabetes - Pregestational, type 2; on glyburide
Fetal Evaluation

 Num Of Fetuses:    1
 Fetal Heart Rate:  145                          bpm
 Cardiac Activity:  Observed
 Presentation:      Cephalic
 Placenta:          Anterior, above cervical os
 P. Cord            Visualized, central
 Insertion:

 Amniotic Fluid
 AFI FV:      Subjectively within normal limits
 AFI Sum:     19.5    cm       76  %Tile     Larg Pckt:    7.41  cm
 RUQ:   4.91    cm   RLQ:    2.5    cm    LUQ:   7.41    cm   LLQ:    4.68   cm
Biometry

 BPD:     79.9  mm     G. Age:  32w 0d                CI:         77.0   70 - 86
 OFD:    103.8  mm                                    FL/HC:      19.4   19.2 -

 HC:     296.1  mm     G. Age:  32w 5d       93  %    HC/AC:      0.98   0.99 -

 AC:     300.9  mm     G. Age:  34w 1d     > 97  %    FL/BPD:     71.8   71 - 87
 FL:      57.4  mm     G. Age:  30w 0d       50  %    FL/AC:      19.1   20 - 24

 Est. FW:    1741  gm      4 lb 7 oz   > 90  %
Gestational Age

 LMP:           30w 6d        Date:  07/20/12                 EDD:   04/26/13
 U/S Today:     32w 1d                                        EDD:   04/17/13
 Best:          29w 4d     Det. By:  U/S    (11/28/12)        EDD:   05/05/13
Anatomy

 Cranium:          Appears normal         Aortic Arch:      Previously seen
 Fetal Cavum:      Previously seen        Ductal Arch:      Previously seen
 Ventricles:       Appears normal         Diaphragm:        Appears normal
 Choroid Plexus:   Previously seen        Stomach:          Appears normal, left
                                                            sided
 Cerebellum:       Previously seen        Abdomen:          Appears normal
 Posterior Fossa:  Previously seen        Abdominal Wall:   Previously seen
 Nuchal Fold:      Previously seen        Cord Vessels:     Previously seen
 Face:             Orbits and profile     Kidneys:          Appear normal
                   previously seen
 Lips:             Previously seen        Bladder:          Appears normal
 Heart:            Previously seen        Spine:            Previously seen
 RVOT:             Previously seen        Lower             Previously seen
                                          Extremities:
 LVOT:             Appears normal         Upper             Previously seen
                                          Extremities:

 Other:  Fetus appears to be a male. Right 5th digit previously visualized.
         Nasal bone previously visualized. Technically difficult due to fetal
         position.
Cervix Uterus Adnexa

 Cervix:       Not visualized (advanced GA >31wks)
Impression

 SIUP at 29+4 weeks
 Normal interval anatomy; anatomic survey complete
 Normal amniotic fluid volume
 EFW > 90th %tile; AC > 97th %tile; at risk to be
 LGA/macrosomic
Recommendations

 Follow-up ultrasound for growth in 4 weeks
 Begin twice weekly NSTs with weekly AFIs at 32 weeks
 Consider checking a JgbA8c

 questions or concerns.

## 2014-10-26 ENCOUNTER — Other Ambulatory Visit: Payer: Self-pay | Admitting: Family Medicine

## 2014-10-31 ENCOUNTER — Other Ambulatory Visit: Payer: Self-pay | Admitting: Family Medicine

## 2015-01-08 ENCOUNTER — Ambulatory Visit (INDEPENDENT_AMBULATORY_CARE_PROVIDER_SITE_OTHER): Payer: Self-pay | Admitting: Family Medicine

## 2015-01-08 ENCOUNTER — Encounter: Payer: Self-pay | Admitting: Family Medicine

## 2015-01-08 VITALS — BP 130/85 | HR 92 | Temp 98.1°F | Wt 153.0 lb

## 2015-01-08 DIAGNOSIS — E785 Hyperlipidemia, unspecified: Secondary | ICD-10-CM

## 2015-01-08 DIAGNOSIS — Z23 Encounter for immunization: Secondary | ICD-10-CM

## 2015-01-08 DIAGNOSIS — E119 Type 2 diabetes mellitus without complications: Secondary | ICD-10-CM

## 2015-01-08 DIAGNOSIS — R809 Proteinuria, unspecified: Secondary | ICD-10-CM

## 2015-01-08 DIAGNOSIS — I10 Essential (primary) hypertension: Secondary | ICD-10-CM

## 2015-01-08 LAB — POCT GLYCOSYLATED HEMOGLOBIN (HGB A1C): Hemoglobin A1C: 8.2

## 2015-01-08 MED ORDER — GLIPIZIDE 5 MG PO TABS: 5.0000 mg | ORAL_TABLET | Freq: Two times a day (BID) | ORAL | Status: DC

## 2015-01-08 NOTE — Patient Instructions (Signed)
Nice to see you again today. Continue taking your amlodipine and metformin. If it helps you remember to take the amlodipine, you can take it at night instead of in the morning. Increase your glipizide to 5 mg twice a day.  Come back to see me in 3 months to follow-up on your diabetes. Diet and exercise are crucial to help control your diabetes.  Take care, Dr. Leonard Schwartz

## 2015-01-08 NOTE — Assessment & Plan Note (Signed)
Due for annual lipid panel today, but as patient is no longer taking statin she is planning to get pregnant, will delay lipid panel until postpartum Plan to resume statin postpartum

## 2015-01-08 NOTE — Progress Notes (Signed)
Subjective:   [name] is a 39 y.o. female with a history of T2 DM, HTN, HLD, microalbuminuria here for follow-up of chronic medical conditions.  T2DM - taking glipizide  daily and Metformin  BID - denies any medication SEs - denies any symptoms of hypoglycemia - checking CBGs at home - fasting glucose ~130-140 (worse on weekends, because of diet) - diet - eating more carbohydrates on weekends, but tries to avoid in general other than rice  HTN - taking amlodipine  daily - but sometimes forgets to take - denies any medication SEs - checked at [facility] last night 127/87 - hasn't taken medication yet this AM - denies any CP, SOB, HAs, vision changes, LE edema  Microalbuminuria - Was previously taking enalapril, but this was discontinued at last visit as patient is trying to get pregnant  HLD - Was previously taking a statin, the patient discontinued this as she is trying to get pregnant  Review of Systems:  Per HPI. All other systems reviewed and are negative.   PMH, PSH, Medications, Allergies, and FmHx reviewed and updated in EMR.  Social History: never smoker  Objective:  BP 130/85 mmHg  Pulse 92  Temp(Src) 98.1 F (36.7 C) (Oral)  Wt 153 lb (69.4 kg)  LMP 01/08/2015  Gen:  39 y.o. female in NAD HEENT: NCAT, MMM, EOMI, PERRL, anicteric sclerae CV: RRR, no MRG Resp: Non-labored, CTAB, no wheezes noted Ext: WWP, no edema Neuro: Alert and oriented, speech normal  Diabetic Foot Exam - Simple   Simple Foot Form  Diabetic Foot exam was performed with the following findings:  Yes 01/08/2015 10:01 AM  Visual Inspection  No deformities, no ulcerations, no other skin breakdown bilaterally:  Yes  Sensation Testing  Intact to touch and monofilament testing bilaterally:  Yes  Pulse Check  Posterior Tibialis and Dorsalis pulse intact bilaterally:  Yes  Comments          Chemistry      Component Value Date/Time   NA 139 08/26/2014 0903   K 4.1 08/26/2014  0903   CL 100 08/26/2014 0903   CO2 23 08/26/2014 0903   BUN 10 08/26/2014 0903   CREATININE 0.52 08/26/2014 0903   CREATININE 0.57 04/16/2013 1543   CREATININE 0.48* 02/15/2013 1228      Component Value Date/Time   CALCIUM 8.7 08/26/2014 0903   ALKPHOS 107 04/16/2013 1543   AST 17 04/16/2013 1543   ALT 15 04/16/2013 1543   BILITOT 0.3 04/16/2013 1543      Lab Results  Component Value Date   WBC 12.4* 04/16/2013   HGB 9.6* 04/16/2013   HCT 28.9* 04/16/2013   MCV 88.1 04/16/2013   PLT 237 04/16/2013   Lab Results  Component Value Date   HGBA1C 8.2 01/08/2015   Assessment & Plan:     [name] is a 39 y.o. female here for T2DM, HTN, HLD, microalbuminuria.  HTN (hypertension) Well controlled on manual recheck Continue amlodipine 5 mg daily  DM type 2 (diabetes mellitus, type 2) A1c continues to be above goal Continue metformin 1000 mg twice a day Increase glipizide to 5 mg twice a day Plan to discontinue metformin when patient finds that she is pregnant Foot exam performed today Referral to ophthalmology for diabetic retinal exam Flu shot and Pneumovax given today Advised about diet and exercise Follow-up in 3 months  Hyperlipemia Due for annual lipid panel today, but as patient is no longer taking statin she is planning to get  pregnant, will delay lipid panel until postpartum Plan to resume statin postpartum  Microalbuminuria Unable to take ace inhibitor currently as patient is planning to get pregnant Plan to resume enalapril 2.5 mg daily postpartum or if patient decided she no longer wishes to become pregnant    Erasmo Downer, MD MPH PGY-2,  [facility] 01/08/2015  10:05 AM

## 2015-01-08 NOTE — Assessment & Plan Note (Signed)
Well controlled on manual recheck Continue amlodipine 5 mg daily

## 2015-01-08 NOTE — Addendum Note (Signed)
Addended by: Garen Grams F on: 01/08/2015 11:49 AM   Modules accepted: Orders

## 2015-01-08 NOTE — Assessment & Plan Note (Signed)
Unable to take ace inhibitor currently as patient is planning to get pregnant Plan to resume enalapril 2.5 mg daily postpartum or if patient decided she no longer wishes to become pregnant

## 2015-01-08 NOTE — Assessment & Plan Note (Addendum)
A1c continues to be above goal Continue metformin 1000 mg twice a day Increase glipizide to 5 mg twice a day Plan to discontinue metformin when patient finds that she is pregnant Foot exam performed today Referral to ophthalmology for diabetic retinal exam Flu shot and Pneumovax given today Advised about diet and exercise Follow-up in 3 months

## 2015-06-26 ENCOUNTER — Other Ambulatory Visit: Payer: Self-pay | Admitting: Family Medicine

## 2015-07-11 ENCOUNTER — Ambulatory Visit (INDEPENDENT_AMBULATORY_CARE_PROVIDER_SITE_OTHER): Payer: BLUE CROSS/BLUE SHIELD | Admitting: Family Medicine

## 2015-07-11 ENCOUNTER — Encounter: Payer: Self-pay | Admitting: Family Medicine

## 2015-07-11 VITALS — BP 140/88 | HR 96 | Temp 98.3°F | Ht 61.0 in | Wt 154.0 lb

## 2015-07-11 DIAGNOSIS — R809 Proteinuria, unspecified: Secondary | ICD-10-CM | POA: Diagnosis not present

## 2015-07-11 DIAGNOSIS — E785 Hyperlipidemia, unspecified: Secondary | ICD-10-CM | POA: Diagnosis not present

## 2015-07-11 DIAGNOSIS — I10 Essential (primary) hypertension: Secondary | ICD-10-CM

## 2015-07-11 DIAGNOSIS — E119 Type 2 diabetes mellitus without complications: Secondary | ICD-10-CM

## 2015-07-11 DIAGNOSIS — Z3009 Encounter for other general counseling and advice on contraception: Secondary | ICD-10-CM | POA: Insufficient documentation

## 2015-07-11 LAB — COMPLETE METABOLIC PANEL WITH GFR
ALBUMIN: 4.3 g/dL (ref 3.6–5.1)
ALT: 59 U/L — ABNORMAL HIGH (ref 6–29)
AST: 37 U/L — ABNORMAL HIGH (ref 10–30)
Alkaline Phosphatase: 54 U/L (ref 33–115)
BUN: 9 mg/dL (ref 7–25)
CALCIUM: 9.2 mg/dL (ref 8.6–10.2)
CHLORIDE: 99 mmol/L (ref 98–110)
CO2: 24 mmol/L (ref 20–31)
CREATININE: 0.56 mg/dL (ref 0.50–1.10)
Glucose, Bld: 163 mg/dL — ABNORMAL HIGH (ref 65–99)
POTASSIUM: 3.8 mmol/L (ref 3.5–5.3)
SODIUM: 137 mmol/L (ref 135–146)
Total Bilirubin: 0.5 mg/dL (ref 0.2–1.2)
Total Protein: 7.6 g/dL (ref 6.1–8.1)

## 2015-07-11 LAB — LIPID PANEL
CHOL/HDL RATIO: 4.7 ratio (ref <=5.0)
CHOLESTEROL: 200 mg/dL (ref 125–200)
HDL: 43 mg/dL — AB (ref >=46)
LDL Cholesterol: 113 mg/dL (ref <130)
TRIGLYCERIDES: 221 mg/dL — AB (ref <150)
VLDL: 44 mg/dL — AB (ref <30)

## 2015-07-11 LAB — POCT GLYCOSYLATED HEMOGLOBIN (HGB A1C): Hemoglobin A1C: 8.1

## 2015-07-11 MED ORDER — GLIPIZIDE 10 MG PO TABS: 10.0000 mg | ORAL_TABLET | Freq: Two times a day (BID) | ORAL | Status: DC

## 2015-07-11 MED ORDER — AMLODIPINE BESYLATE 5 MG PO TABS: 5.0000 mg | ORAL_TABLET | Freq: Every day | ORAL | Status: DC

## 2015-07-11 NOTE — Patient Instructions (Signed)
Nice to see you again today. We are getting some labs and someone will call you or send you a letter with the results when they're available. Your A1c is elevated at 8.1, so we will increase your glipizide to 10 mg twice a day. Continue your other current medications. Getting pregnant can take 6-12 months and that is normal. Your previous C-section and your current medications are not affecting her ability to get pregnant. When I see you back in 3 months, if you're still having trouble getting pregnant we can talk about a referral to gynecology for fertility management.  Take care, Dr. BLeonard Schwartz

## 2015-07-11 NOTE — Assessment & Plan Note (Signed)
Advised patient that becoming pregnant can take 6-12 months normally Follow-up in 3 months and consider referral to gynecology for fertility management at that time if still not pregnant We'll intervene earlier as patient is [age] and pregnancy is a bit more time sensitive for her

## 2015-07-11 NOTE — Progress Notes (Signed)
Subjective:   [name] is a 40 y.o. female with a history of HTN, T2DM, HLD here for diabetes f/u and trying to get pregnant.  T2DM - Checking BG at home: fasting 120s-130s - Medications: metformin  BID, glipizide  BID - Compliance: forgets morning dose 2-3 times/wk - Diet: eats rice with most meals - has cut back on portion size - Exercise: walking 2-3 times/wk - eye exam: normal per report 01/2015 - foot exam: done 12/2014 - denies symptoms of hypoglycemia, polyuria, polydipsia  HTN: - Medications: amlodipine  daily - Compliance: forgets a few times/wk - hasnt take n yet today - Checking BP at home: no - Denies any SOB, CP, vision changes, LE edema, medication SEs, or symptoms of hypotension  Trying to get pregnant - has been trying for 2 months - frustrated because not pregnant - husband wonders if it is related to medications or previous C-section  Microalbuminuria - Was previously taking enalapril, but this was discontinued as patient is trying to get pregnant  HLD - Was previously taking a statin, the patient discontinued this as she is trying to get pregnant   Review of Systems:  Per HPI.   Social History: never smoker  Objective:  BP 140/88 mmHg  Pulse 96  Temp(Src) 98.3 F (36.8 C) (Oral)  Ht  (1.549 m)  Wt 154 lb (69.854 kg)  BMI 29.11 kg/m2  SpO2 99%  LMP 06/29/2015  Gen:  40 y.o. female in NAD HEENT: NCAT, MMM, EOMI, PERRL, anicteric sclerae CV: RRR, no MRG Resp: Non-labored, CTAB, no wheezes noted Abd: Soft, NTND, BS present, no guarding or organomegaly Ext: WWP, no edema MSK: no obvious deformities Neuro: Alert and oriented, speech normal      Chemistry      Component Value Date/Time   NA 139 08/26/2014 0903   K 4.1 08/26/2014 0903   CL 100 08/26/2014 0903   CO2 23 08/26/2014 0903   BUN 10 08/26/2014 0903   CREATININE 0.52 08/26/2014 0903   CREATININE 0.57 04/16/2013 1543   CREATININE 0.48* 02/15/2013 1228        Component Value Date/Time   CALCIUM 8.7 08/26/2014 0903   ALKPHOS 107 04/16/2013 1543   AST 17 04/16/2013 1543   ALT 15 04/16/2013 1543   BILITOT 0.3 04/16/2013 1543      Lab Results  Component Value Date   WBC 12.4* 04/16/2013   HGB 9.6* 04/16/2013   HCT 28.9* 04/16/2013   MCV 88.1 04/16/2013   PLT 237 04/16/2013   Lab Results  Component Value Date   HGBA1C 8.1 07/11/2015   Assessment & Plan:     [name] is a 40 y.o. female here for  HTN (hypertension) Slightly elevated today, patient has not taken her medications yet today Advised patient to take her medications before next visit Continue amlodipine 5 mg daily Check bmet today  DM type 2 (diabetes mellitus, type 2) A1c stable at 8.1 Advised on diet and exercise Increase glipizide to 10 mg twice a day Continue metformin 1000 mg twice a day Follow-up in 3 months  Hyperlipemia Lipid panel today  Microalbuminuria Plan to resume ACE inhibitor after pregnancy  Family planning counseling Advised patient that becoming pregnant can take 6-12 months normally Follow-up in 3 months and consider referral to gynecology for fertility management at that time if still not pregnant We'll intervene earlier as patient is 25 and pregnancy is a bit more time sensitive for her     Marylene Land  Berton BonM Estefanie Cornforth, MD MPH PGY-2,  [facility] 07/11/2015  10:12 AM

## 2015-07-11 NOTE — Assessment & Plan Note (Signed)
A1c stable at 8.1 Advised on diet and exercise Increase glipizide to 10 mg twice a day Continue metformin 1000 mg twice a day Follow-up in 3 months

## 2015-07-11 NOTE — Assessment & Plan Note (Signed)
Plan to resume ACE inhibitor after pregnancy

## 2015-07-11 NOTE — Assessment & Plan Note (Signed)
Lipid panel today

## 2015-07-11 NOTE — Assessment & Plan Note (Signed)
Slightly elevated today, patient has not taken her medications yet today Advised patient to take her medications before next visit Continue amlodipine 5 mg daily Check bmet today

## 2015-07-12 ENCOUNTER — Encounter: Payer: Self-pay | Admitting: Family Medicine

## 2015-12-16 ENCOUNTER — Ambulatory Visit (INDEPENDENT_AMBULATORY_CARE_PROVIDER_SITE_OTHER): Payer: BLUE CROSS/BLUE SHIELD | Admitting: Internal Medicine

## 2015-12-16 ENCOUNTER — Encounter: Payer: Self-pay | Admitting: Internal Medicine

## 2015-12-16 ENCOUNTER — Other Ambulatory Visit (HOSPITAL_COMMUNITY)
Admission: RE | Admit: 2015-12-16 | Discharge: 2015-12-16 | Disposition: A | Discharge status: Home / Self Care | Payer: BLUE CROSS/BLUE SHIELD | Source: Ambulatory Visit | Attending: Family Medicine | Admitting: Family Medicine

## 2015-12-16 VITALS — BP 137/86 | HR 91 | Temp 98.3°F | Ht 61.0 in | Wt 156.2 lb

## 2015-12-16 DIAGNOSIS — Z23 Encounter for immunization: Secondary | ICD-10-CM

## 2015-12-16 DIAGNOSIS — Z124 Encounter for screening for malignant neoplasm of cervix: Secondary | ICD-10-CM

## 2015-12-16 DIAGNOSIS — Z01419 Encounter for gynecological examination (general) (routine) without abnormal findings: Secondary | ICD-10-CM | POA: Diagnosis present

## 2015-12-16 DIAGNOSIS — R748 Abnormal levels of other serum enzymes: Secondary | ICD-10-CM

## 2015-12-16 DIAGNOSIS — Z1151 Encounter for screening for human papillomavirus (HPV): Secondary | ICD-10-CM | POA: Insufficient documentation

## 2015-12-16 DIAGNOSIS — Z3009 Encounter for other general counseling and advice on contraception: Secondary | ICD-10-CM

## 2015-12-16 DIAGNOSIS — I10 Essential (primary) hypertension: Secondary | ICD-10-CM | POA: Diagnosis not present

## 2015-12-16 DIAGNOSIS — E119 Type 2 diabetes mellitus without complications: Secondary | ICD-10-CM | POA: Diagnosis not present

## 2015-12-16 LAB — COMPLETE METABOLIC PANEL WITH GFR
ALBUMIN: 4.4 g/dL (ref 3.6–5.1)
ALK PHOS: 56 U/L (ref 33–115)
ALT: 65 U/L — ABNORMAL HIGH (ref 6–29)
AST: 35 U/L — ABNORMAL HIGH (ref 10–30)
BILIRUBIN TOTAL: 0.3 mg/dL (ref 0.2–1.2)
BUN: 11 mg/dL (ref 7–25)
CALCIUM: 8.8 mg/dL (ref 8.6–10.2)
CO2: 23 mmol/L (ref 20–31)
Chloride: 101 mmol/L (ref 98–110)
Creat: 0.6 mg/dL (ref 0.50–1.10)
GLUCOSE: 141 mg/dL — AB (ref 65–99)
POTASSIUM: 3.8 mmol/L (ref 3.5–5.3)
SODIUM: 137 mmol/L (ref 135–146)
Total Protein: 7.6 g/dL (ref 6.1–8.1)

## 2015-12-16 LAB — POCT GLYCOSYLATED HEMOGLOBIN (HGB A1C): HEMOGLOBIN A1C: 8

## 2015-12-16 MED ORDER — GLIPIZIDE 10 MG PO TABS: 10.0000 mg | ORAL_TABLET | Freq: Two times a day (BID) | ORAL | 2 refills | Status: DC

## 2015-12-16 MED ORDER — METFORMIN HCL 850 MG PO TABS: 850.0000 mg | ORAL_TABLET | Freq: Three times a day (TID) | ORAL | 2 refills | Status: DC

## 2015-12-16 MED ORDER — AMLODIPINE BESYLATE 5 MG PO TABS: 5.0000 mg | ORAL_TABLET | Freq: Every day | ORAL | 3 refills | Status: DC

## 2015-12-16 NOTE — Progress Notes (Signed)
[facility] Progress Note  Subjective:  [name] is a 39-y/o female who presents for follow-up of diabetes and desire to become pregnant.   T2DM: - Taking glipizide 10 mg BID (increased from qd at end of March) and metformin 1000 mg BID - Misses doses of medication about 2-3 times a week during the day - Denies hypogylcemic events  - Checks CBGs in the mornings. Usually ranges from 120-140s.  - Says traditional foods for her include rice but eats with protein - Says she was only on insulin during pregnancy ROS: No dizziness, no abdominal pain, no diarrhea  Desire for pregnancy: - Says she got pregnant easily without trying with first 2 pregnancies - Has been trying for over a year. Has sex about twice a month, not timed with ovulation. - Husband is 90 years old.  - Has 63 year old son. Current husband is father. - Had loss of 2nd baby at [id number] delivery in December 2014. Infant was LGA and became stuck in birth canal. Still is very painful for her to think about but feels ready to try again.  - Pt and her husband work a lot. 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 Constitutional: Overweight female, in NAD Abdominal: Soft. +BS, NT, ND, no rebound or guarding.  GU: Blood in vaginal vault (having period currently), no cervical motion tenderness Psychiatric: Normal mood and affect.  Vitals reviewed  Assessment/Plan: DM type 2 (diabetes mellitus, type 2) - Hgb A1c 8.0 today. Last was 8.1 on 07/11/15. - Recommended increasing metformin to 850 mg TID, as tolerates well. Patient says she could bring her medication to work to help with  forgetting doses. - Stressed importance of not missing doses - Would consider starting insulin next if repeat hgb A1c in 3 months is not improved and considering that insulin would likely be recommended if patient does become pregnant. Patient preferred to hold off on insulin initiation at this time and continue PO management. - Recommended checking CBGs with largest meal of day. Advised patient to keep track of meals that seem to cause sugars to be elevated.   Family planning counseling - Has been trying to get pregnant for over 1 year. Intercourse, however, has been infrequent and not timed with ovulation.  - Recommended trying to time sexual intercourse around ovulation and buying an OTC ovulation kit.  - Pt declined OB referral at this time but said she would call back in next couple of months if still not pregnant    Health Maintenance: - Pap smear performed today.  Follow-up in 3 months for hgb A1c check.  HOlene Floss MD MDaviess PGY-2

## 2015-12-16 NOTE — Patient Instructions (Signed)
Ms. Rothlisberger,  I have increased you metformin to 850 mg three times daily. Please continue glipizide 10 mg twice daily.   Please check your sugars after your largest meal of the day. This can also help show what type of meals increase your blood sugars most.  I recommend buying an ovulation kit at your local pharmacy and timing your sexual activity around time of ovulation (egg release). This is roughly 2 weeks after your period, but the kit can tell you more specifically when your best chance of getting pregnant is. Please call clinic if you would like to be referred to OB.  Please follow-up in 3 months for another blood sugar check.  Best, Dr. Ola Spurr

## 2015-12-17 LAB — CYTOLOGY - PAP

## 2015-12-17 NOTE — Assessment & Plan Note (Signed)
-   Hgb A1c 8.0 today. Last was 8.1 on 07/11/15. - Recommended increasing metformin to 850 mg TID, as tolerates well. Patient says she could bring her medication to work to help with forgetting doses. - Stressed importance of not missing doses - Would consider starting insulin next if repeat hgb A1c in 3 months is not improved and considering that insulin would likely be recommended if patient does become pregnant. Patient preferred to hold off on insulin initiation at this time and continue PO management. - Recommended checking CBGs with largest meal of day. Advised patient to keep track of meals that seem to cause sugars to be elevated.

## 2015-12-17 NOTE — Assessment & Plan Note (Signed)
-  Has been trying to get pregnant for over 1 year. Intercourse, however, has been infrequent and not timed with ovulation.  - Recommended trying to time sexual intercourse around ovulation and buying an OTC ovulation kit.  - Pt declined OB referral at this time but said she would call back in next couple of months if still not pregnant  

## 2015-12-30 ENCOUNTER — Encounter: Payer: Self-pay | Admitting: Internal Medicine

## 2016-03-12 ENCOUNTER — Encounter: Payer: Self-pay | Admitting: Family Medicine

## 2016-03-12 ENCOUNTER — Ambulatory Visit (INDEPENDENT_AMBULATORY_CARE_PROVIDER_SITE_OTHER): Payer: BLUE CROSS/BLUE SHIELD | Admitting: Family Medicine

## 2016-03-12 VITALS — BP 139/79 | HR 93 | Temp 98.2°F | Ht 61.0 in | Wt 158.2 lb

## 2016-03-12 DIAGNOSIS — N912 Amenorrhea, unspecified: Secondary | ICD-10-CM

## 2016-03-12 DIAGNOSIS — Z3481 Encounter for supervision of other normal pregnancy, first trimester: Secondary | ICD-10-CM

## 2016-03-12 DIAGNOSIS — I10 Essential (primary) hypertension: Secondary | ICD-10-CM

## 2016-03-12 DIAGNOSIS — E119 Type 2 diabetes mellitus without complications: Secondary | ICD-10-CM | POA: Diagnosis not present

## 2016-03-12 DIAGNOSIS — Z3480 Encounter for supervision of other normal pregnancy, unspecified trimester: Secondary | ICD-10-CM | POA: Insufficient documentation

## 2016-03-12 LAB — POCT GLYCOSYLATED HEMOGLOBIN (HGB A1C): Hemoglobin A1C: 7.1

## 2016-03-12 LAB — POCT URINE PREGNANCY: PREG TEST UR: POSITIVE — AB

## 2016-03-12 MED ORDER — PRENATAL 27-0.8 MG PO TABS: 1.0000 | ORAL_TABLET | Freq: Every day | ORAL | 2 refills | Status: DC

## 2016-03-12 MED ORDER — LABETALOL HCL 100 MG PO TABS: 100.0000 mg | ORAL_TABLET | Freq: Two times a day (BID) | ORAL | 1 refills | Status: DC

## 2016-03-12 NOTE — Assessment & Plan Note (Signed)
Well controlled today D/c amlodipine as it is unsafe in pregnancy Start labetalol 100mg  BID RN visit in 2 wks for BP check, unless she gets appt with OB before that

## 2016-03-12 NOTE — Assessment & Plan Note (Signed)
UPT positive today Start prenatal vitamin Switch anti-hypertensives as above To call [facility] ([facility] for appt

## 2016-03-12 NOTE — Assessment & Plan Note (Signed)
A1c improving from 8 to 7.1 Advised on diet and exercise OK to continue Metformin and glipizide during pregnancy Foot exam completed today Pt to have Optho send diabetic eye exam documentation OB to take over care during pregnancy

## 2016-03-12 NOTE — Progress Notes (Signed)
Subjective:   [name] is a 40 y.o. female with a history of HTN, T2DM here for pregnancy test  Wonders if she may be pregnant - her and her husband have been trying for pregnancy for >6315yr - LMP 01/17/16 - usually regular - 2 home pregnancy tests positive (1 dark, 1 light) - previously G2P1101 - last pregnancy resulted in 8 month still birth - [facility] - denies abd pain or bleeding  T2DM - Checking BG at home: yes, fasting ~110s - Medications: Metformin 850mg  TID, glipizide 10mg  BID - Compliance: good - Diet: rice and vegetables mostly, some chicken - Exercise: walking 3-4 times weekly for 2-3 miles - eye exam: reported 01/13/16 - foot exam: needs today - microalbumin: needs - denies symptoms of hypoglycemia, polyuria, polydipsia, numbness extremities, foot ulcers/trauma  HTN: - Medications: amlodipine 5mg  daily - Compliance: good - Checking BP at home: no - Denies any SOB, CP, vision changes, LE edema, medication SEs, or symptoms of hypotension   Review of Systems:  Per HPI.   Social History: never smoker  Objective:  BP 139/79   Pulse 93   Temp 98.2 F (36.8 C) (Oral)   Ht 5\' 1"  (1.549 m)   Wt 158 lb 3.2 oz (71.8 kg)   LMP 01/17/2016   BMI 29.89 kg/m   Gen:  [age] y.o. female in NAD HEENT: NCAT, MMM, EOMI, PERRL, anicteric sclerae CV: RRR, no MRG Resp: Non-labored, CTAB, no wheezes noted Abd: Soft, NTND, BS present, no guarding or organomegaly Ext: WWP, no edema MSK: No obvious deformities, gait intact Neuro: Alert and oriented, speech normal  Diabetic Foot Check -  Appearance - no lesions, ulcers or calluses Skin - no unusual pallor or redness Monofilament testing -  Right - Great toe, medial, central, lateral ball and posterior foot intact Left - Great toe, medial, central, lateral ball and posterior foot intact        Chemistry      Component Value Date/Time   NA 137 12/16/2015 1001   K 3.8 12/16/2015 1001    CL 101 12/16/2015 1001   CO2 23 12/16/2015 1001   BUN 11 12/16/2015 1001   CREATININE 0.60 12/16/2015 1001      Component Value Date/Time   CALCIUM 8.8 12/16/2015 1001   ALKPHOS 56 12/16/2015 1001   AST 35 (H) 12/16/2015 1001   ALT 65 (H) 12/16/2015 1001   BILITOT 0.3 12/16/2015 1001      Lab Results  Component Value Date   WBC 12.4 (H) 04/16/2013   HGB 9.6 (L) 04/16/2013   HCT 28.9 (L) 04/16/2013   MCV 88.1 04/16/2013   PLT 237 04/16/2013    Lab Results  Component Value Date   HGBA1C 7.1 03/12/2016   Assessment & Plan:     [name] is a 40 y.o. female here for   HTN (hypertension) Well controlled today D/c amlodipine as it is unsafe in pregnancy Start labetalol 100mg  BID RN visit in 2 wks for BP check, unless she gets appt with OB before that  DM type 2 (diabetes mellitus, type 2) A1c improving from 8 to 7.1 Advised on diet and exercise OK to continue Metformin and glipizide during pregnancy Foot exam completed today Pt to have Optho send diabetic eye exam documentation OB to take over care during pregnancy  Encounter for supervision of other normal pregnancy UPT positive today Start prenatal vitamin Switch anti-hypertensives as above To call [facility] (  patient choice) for appt     Erasmo DownerAngela M Bacigalupo, MD MPH PGY-3,  [facility] 03/12/2016  9:37 AM

## 2016-03-12 NOTE — Progress Notes (Signed)
a1c

## 2016-03-12 NOTE — Patient Instructions (Signed)
Congratulations on your pregnancy. Stop taking amlodipine and start taking labetalol for your blood pressure. Continue your glipizide and metformin for your diabetes. I also sent a prenatal vitamin to the pharmacy which she should start taking daily.  Call the OB of your choice to establish prenatal care. If you are unable to see them within the next 2-3 weeks, please schedule a nurse visit with our clinic to recheck your blood pressure.  Take care, Dr. BLeonard Schwartz

## 2016-03-26 ENCOUNTER — Ambulatory Visit (INDEPENDENT_AMBULATORY_CARE_PROVIDER_SITE_OTHER): Payer: BLUE CROSS/BLUE SHIELD | Admitting: *Deleted

## 2016-03-26 VITALS — BP 134/82 | HR 85

## 2016-03-26 DIAGNOSIS — Z0130 Encounter for examination of blood pressure without abnormal findings: Secondary | ICD-10-CM

## 2016-03-26 DIAGNOSIS — I10 Essential (primary) hypertension: Secondary | ICD-10-CM

## 2016-03-26 NOTE — Progress Notes (Signed)
   Patient in nurse clinic for blood pressure check. Patient denies chest pain, SOB, headache or dizziness.  Patient is currently pregnant and blood pressure medication was changed.  Patient is taking medication as prescribed.  Patient stated she called [facility] an appointment [facility], but they did not accept patient's insurance.  She called another office and awaiting a call back.  Will forward to PCP.  Clovis PuMartin, Ziaire Hagos L, RN  Today's Vitals   03/26/16 0908  BP: 134/82  Pulse: 85  SpO2: 98%  PainSc: 0-No pain

## 2016-03-29 NOTE — Progress Notes (Signed)
Can we call her and follow-up that she has gotten an appointment with an OB for prenatal care?  Thanks!  Erasmo DownerAngela M Alesandro Stueve, MD, MPH PGY-3,  [facility] 03/29/2016 4:58 PM

## 2016-04-12 HISTORY — DX: Maternal care for unspecified type scar from previous cesarean delivery: O34.219

## 2016-04-13 LAB — OB RESULTS CONSOLE RPR: RPR: NONREACTIVE

## 2016-04-13 LAB — OB RESULTS CONSOLE ABO/RH: RH Type: POSITIVE

## 2016-04-13 LAB — OB RESULTS CONSOLE HEPATITIS B SURFACE ANTIGEN: Hepatitis B Surface Ag: NEGATIVE

## 2016-04-13 LAB — OB RESULTS CONSOLE GC/CHLAMYDIA
CHLAMYDIA, DNA PROBE: NEGATIVE
GC PROBE AMP, GENITAL: NEGATIVE

## 2016-04-13 LAB — OB RESULTS CONSOLE RUBELLA ANTIBODY, IGM: Rubella: NON-IMMUNE/NOT IMMUNE

## 2016-04-13 LAB — OB RESULTS CONSOLE HIV ANTIBODY (ROUTINE TESTING): HIV: NONREACTIVE

## 2016-04-13 LAB — OB RESULTS CONSOLE ANTIBODY SCREEN: Antibody Screen: NEGATIVE

## 2016-05-11 ENCOUNTER — Other Ambulatory Visit: Payer: Self-pay | Admitting: Family Medicine

## 2016-06-15 ENCOUNTER — Other Ambulatory Visit: Payer: Self-pay | Admitting: Family Medicine

## 2016-07-12 ENCOUNTER — Other Ambulatory Visit: Payer: Self-pay | Admitting: Family Medicine

## 2016-07-20 ENCOUNTER — Other Ambulatory Visit: Payer: Self-pay | Admitting: Family Medicine

## 2016-07-21 NOTE — Telephone Encounter (Signed)
This needs to be filled by patient's OB who should be managing chronic HTN during pregnancy.  Please let patient know to discuss with OB.  Erasmo Downer, MD, MPH PGY-3,  [facility] 07/21/2016 1:53 PM

## 2016-07-22 ENCOUNTER — Other Ambulatory Visit: Payer: Self-pay | Admitting: Family Medicine

## 2016-07-22 NOTE — Telephone Encounter (Signed)
Tried to call patient, no answer.

## 2016-07-26 NOTE — Telephone Encounter (Signed)
Patient will need to discuss BP management and Rxs with provider managing her BP at this time (should be her OB as she is pregnant and has chronic HTN).  Please let patient know.  Thanks!   Erasmo Downer, MD, MPH PGY-3,  [facility] 07/26/2016 9:01 AM

## 2016-07-29 NOTE — Telephone Encounter (Signed)
Spoke with patient she has already contacted OB for refill of this medication.

## 2016-10-01 ENCOUNTER — Encounter (HOSPITAL_COMMUNITY): Payer: Self-pay | Admitting: *Deleted

## 2016-10-01 ENCOUNTER — Telehealth (HOSPITAL_COMMUNITY): Payer: Self-pay | Admitting: *Deleted

## 2016-10-01 NOTE — Telephone Encounter (Signed)
Preadmission screen  

## 2016-10-04 ENCOUNTER — Encounter (HOSPITAL_COMMUNITY): Payer: Self-pay

## 2016-10-09 ENCOUNTER — Encounter (HOSPITAL_COMMUNITY): Payer: Self-pay | Admitting: *Deleted

## 2016-10-09 ENCOUNTER — Inpatient Hospital Stay (EMERGENCY_DEPARTMENT_HOSPITAL)
Admission: AD | Admit: 2016-10-09 | Discharge: 2016-10-09 | Disposition: A | Discharge status: Home / Self Care | Payer: BLUE CROSS/BLUE SHIELD | Source: Ambulatory Visit | Attending: Obstetrics and Gynecology | Admitting: Obstetrics and Gynecology

## 2016-10-09 DIAGNOSIS — Z3A38 38 weeks gestation of pregnancy: Secondary | ICD-10-CM

## 2016-10-09 DIAGNOSIS — O34211 Maternal care for low transverse scar from previous cesarean delivery: Secondary | ICD-10-CM | POA: Insufficient documentation

## 2016-10-09 DIAGNOSIS — O4693 Antepartum hemorrhage, unspecified, third trimester: Secondary | ICD-10-CM | POA: Diagnosis not present

## 2016-10-09 NOTE — Discharge Instructions (Signed)
Vaginal Bleeding During Pregnancy, Third Trimester °A small amount of bleeding (spotting) from the vagina is relatively common in pregnancy. Various things can cause bleeding or spotting in pregnancy. Sometimes the bleeding is normal and is not a problem. However, bleeding during the third trimester can also be a sign of something serious for the mother and the baby. Be sure to tell your health care provider about any vaginal bleeding right away. °Some possible causes of vaginal bleeding during the third trimester include: °· The placenta may be partially or completely covering the opening to the cervix (placenta previa). °· The placenta may have separated from the uterus (abruption of the placenta). °· There may be an infection or growth on the cervix. °· You may be starting labor, called discharging of the mucus plug. °· The placenta may grow into the muscle layer of the uterus (placenta accreta). ° °Follow these instructions at home: °Watch your condition for any changes. The following actions may help to lessen any discomfort you are feeling: °· Follow your health care provider's instructions for limiting your activity. If your health care provider orders bed rest, you may need to stay in bed and only get up to use the bathroom. However, your health care provider may allow you to continue light activity. °· If needed, make plans for someone to help with your regular activities and responsibilities while you are on bed rest. °· Keep track of the number of pads you use each day, how often you change pads, and how soaked (saturated) they are. Write this down. °· Do not use tampons. Do not douche. °· Do not have sexual intercourse or orgasms until approved by your health care provider. °· Follow your health care provider's advice about lifting, driving, and physical activities. °· If you pass any tissue from your vagina, save the tissue so you can show it to your health care provider. °· Only take over-the-counter  or prescription medicines as directed by your health care provider. °· Do not take aspirin because it can make you bleed. °· Keep all follow-up appointments as directed by your health care provider. ° °Contact a health care provider if: °· You have any vaginal bleeding during any part of your pregnancy. °· You have cramps or labor pains. °· You have a fever, not controlled by medicine. °Get help right away if: °· You have severe cramps or pain in your back or belly (abdomen). °· You have chills. °· You have a gush of fluid from the vagina. °· You pass large clots or tissue from your vagina. °· Your bleeding increases. °· You feel light-headed or weak. °· You pass out. °· You feel less movement or no movement of the baby. °This information is not intended to replace advice given to you by your health care provider. Make sure you discuss any questions you have with your health care provider. °Document Released: 06/19/2002 Document Revised: 09/04/2015 Document Reviewed: 12/04/2012 °Elsevier Interactive Patient Education © 2018 [employer] ° °

## 2016-10-09 NOTE — MAU Note (Signed)
Patient presents with a small amount of bleeding and irregular contractions.

## 2016-10-09 NOTE — MAU Provider Note (Signed)
History   G3P1101 @ 38 WKS IN WITH C/O VAGINAL BLEEDING SINCE THIS MORNING. BLEEDING IS SPOTTING IN NATURE. dENIES ACTIVE BLEEDING OR ROM.  CSN: [id number]  Arrival date & time 10/09/16  0930   None     Chief Complaint  Patient presents with  . Vaginal Bleeding  . Contractions    HPI  Past Medical History:  Diagnosis Date  . Gestational diabetes    With first pregnancy, glyburide  . Obesity (BMI 30-39.9)   . Pregnancy induced hypertension     Past Surgical History:  Procedure Laterality Date  . CESAREAN SECTION N/A 04/10/2013   Procedure: CESAREAN SECTION;  Surgeon: Allie Bossier, MD;  Location: [facility];  Service: Obstetrics;  Laterality: N/A;  . PERINEUM REPAIR N/A 04/10/2013   Procedure: EPISIOTOMY REPAIR;  Surgeon: Allie Bossier, MD;  Location: [facility];  Service: Obstetrics;  Laterality: N/A;    Family History  Problem Relation Age of Onset  . Cancer Father   . Alcohol abuse Neg Hx   . Arthritis Neg Hx   . Asthma Neg Hx   . Birth defects Neg Hx   . COPD Neg Hx   . Depression Neg Hx   . Diabetes Neg Hx   . Drug abuse Neg Hx   . Early death Neg Hx   . Hearing loss Neg Hx   . Heart disease Neg Hx   . Hyperlipidemia Neg Hx   . Hypertension Neg Hx   . Kidney disease Neg Hx   . Learning disabilities Neg Hx   . Mental illness Neg Hx   . Mental retardation Neg Hx   . Miscarriages / Stillbirths Neg Hx   . Stroke Neg Hx   . Vision loss Neg Hx   . Varicose Veins Neg Hx     Social History  Substance Use Topics  . Smoking status: Never Smoker  . Smokeless tobacco: Never Used  . Alcohol use No    OB History    Gravida Para Term Preterm AB Living   3 2 1 1  0 1   SAB TAB Ectopic Multiple Live Births           1      Review of Systems  Constitutional: Negative.   HENT: Negative.   Eyes: Negative.   Respiratory: Negative.   Cardiovascular: Negative.   Gastrointestinal: Negative.   Endocrine: Negative.   Genitourinary: Positive for vaginal bleeding.   Musculoskeletal: Negative.   Skin: Negative.   Allergic/Immunologic: Negative.   Neurological: Negative.   Hematological: Negative.   Psychiatric/Behavioral: Negative.     Allergies  Patient has no known allergies.  Home Medications    BP (!) 144/84   Pulse 87   Temp 98.7 F (37.1 C) (Oral)   Resp 16   Ht 5\' 1"  (1.549 m)   Wt 174 lb (78.9 kg)   LMP 01/17/2016   BMI 32.88 kg/m   Physical Exam  Constitutional: She is oriented to person, place, and time. She appears well-developed and well-nourished.  HENT:  Head: Normocephalic.  Eyes: Pupils are equal, round, and reactive to light.  Neck: Normal range of motion.  Cardiovascular: Normal rate, regular rhythm, normal heart sounds and intact distal pulses.   Pulmonary/Chest: Effort normal and breath sounds normal.  Abdominal: Soft. Bowel sounds are normal.  Genitourinary: Vagina normal and uterus normal.  Musculoskeletal: Normal range of motion.  Neurological: She is alert and oriented to person, place, and time. She has normal reflexes.  Skin: Skin is warm and dry.  Psychiatric: She has a normal mood and affect. Her behavior is normal. Judgment and thought content normal.    MAU Course  Procedures (including critical care time)  Labs Reviewed - No data to display No results found.   Vaginal bleeding in preg   MDM  VSS, NST reactive, SVE 1/th/post/high. Pinkish show on exam glove no active bleeding. POC discussed with Dr. Mindi SlickerBanga pt to be d/c home.

## 2016-10-09 NOTE — MAU Note (Signed)
Having irregular ucs for past 2-3 days; c/o bleeding since this AM around 0830;

## 2016-10-09 NOTE — MAU Note (Signed)
Pt does not think she is in labor but she is worried about the bleeding;  has not had intercourse in a month;

## 2016-10-11 ENCOUNTER — Inpatient Hospital Stay (HOSPITAL_COMMUNITY)
Admission: RE | Admit: 2016-10-11 | Discharge: 2016-10-14 | DRG: 765 | Disposition: A | Discharge status: Home / Self Care | Payer: BLUE CROSS/BLUE SHIELD | Source: Ambulatory Visit | Attending: Obstetrics and Gynecology | Admitting: Obstetrics and Gynecology

## 2016-10-11 ENCOUNTER — Inpatient Hospital Stay (HOSPITAL_COMMUNITY): Payer: BLUE CROSS/BLUE SHIELD | Admitting: Certified Registered Nurse Anesthetist

## 2016-10-11 ENCOUNTER — Encounter (HOSPITAL_COMMUNITY): Payer: Self-pay

## 2016-10-11 ENCOUNTER — Encounter (HOSPITAL_COMMUNITY): Payer: Self-pay | Admitting: Obstetrics and Gynecology

## 2016-10-11 ENCOUNTER — Encounter (HOSPITAL_COMMUNITY)
Admission: RE | Disposition: A | Discharge status: Home / Self Care | Payer: Self-pay | Source: Ambulatory Visit | Attending: Obstetrics and Gynecology

## 2016-10-11 ENCOUNTER — Inpatient Hospital Stay (HOSPITAL_COMMUNITY): Admit: 2016-10-11 | Payer: BLUE CROSS/BLUE SHIELD | Admitting: Obstetrics and Gynecology

## 2016-10-11 DIAGNOSIS — Z98891 History of uterine scar from previous surgery: Secondary | ICD-10-CM

## 2016-10-11 DIAGNOSIS — O24425 Gestational diabetes mellitus in childbirth, controlled by oral hypoglycemic drugs: Secondary | ICD-10-CM | POA: Diagnosis present

## 2016-10-11 DIAGNOSIS — O34211 Maternal care for low transverse scar from previous cesarean delivery: Principal | ICD-10-CM | POA: Diagnosis present

## 2016-10-11 DIAGNOSIS — O163 Unspecified maternal hypertension, third trimester: Secondary | ICD-10-CM | POA: Diagnosis present

## 2016-10-11 DIAGNOSIS — Z3A38 38 weeks gestation of pregnancy: Secondary | ICD-10-CM

## 2016-10-11 DIAGNOSIS — O24919 Unspecified diabetes mellitus in pregnancy, unspecified trimester: Secondary | ICD-10-CM | POA: Diagnosis present

## 2016-10-11 DIAGNOSIS — O1002 Pre-existing essential hypertension complicating childbirth: Secondary | ICD-10-CM | POA: Diagnosis present

## 2016-10-11 DIAGNOSIS — O99824 Streptococcus B carrier state complicating childbirth: Secondary | ICD-10-CM | POA: Diagnosis present

## 2016-10-11 DIAGNOSIS — O34219 Maternal care for unspecified type scar from previous cesarean delivery: Secondary | ICD-10-CM

## 2016-10-11 HISTORY — DX: Unspecified maternal hypertension, third trimester: O16.3

## 2016-10-11 LAB — TYPE AND SCREEN
ABO/RH(D): O POS
Antibody Screen: NEGATIVE

## 2016-10-11 LAB — CBC
HCT: 38.6 % (ref 36.0–46.0)
Hemoglobin: 13 g/dL (ref 12.0–15.0)
MCH: 28.8 pg (ref 26.0–34.0)
MCHC: 33.7 g/dL (ref 30.0–36.0)
MCV: 85.6 fL (ref 78.0–100.0)
PLATELETS: 141 10*3/uL — AB (ref 150–400)
RBC: 4.51 MIL/uL (ref 3.87–5.11)
RDW: 14.8 % (ref 11.5–15.5)
WBC: 9.7 10*3/uL (ref 4.0–10.5)

## 2016-10-11 LAB — GLUCOSE, CAPILLARY: GLUCOSE-CAPILLARY: 101 mg/dL — AB (ref 65–99)

## 2016-10-11 SURGERY — Surgical Case
Anesthesia: Regional

## 2016-10-11 SURGERY — Surgical Case
Anesthesia: Spinal

## 2016-10-11 MED ORDER — OXYTOCIN 10 UNIT/ML IJ SOLN
INTRAMUSCULAR | Status: AC
  Filled 2016-10-11: qty 4

## 2016-10-11 MED ORDER — NALOXONE HCL 2 MG/2ML IJ SOSY: 1.0000 ug/kg/h | PREFILLED_SYRINGE | INTRAVENOUS | Status: DC | PRN

## 2016-10-11 MED ORDER — CEFOTETAN DISODIUM-DEXTROSE 2-2.08 GM-% IV SOLR
2.0000 g | INTRAVENOUS | Status: AC
  Administered 2016-10-11: 2 g via INTRAVENOUS
  Filled 2016-10-11: qty 50

## 2016-10-11 MED ORDER — LACTATED RINGERS IV SOLN
INTRAVENOUS | Status: DC
  Administered 2016-10-11 (×2): via INTRAVENOUS

## 2016-10-11 MED ORDER — NALBUPHINE SYRINGE 5 MG/0.5 ML: 5.0000 mg | INJECTION | INTRAMUSCULAR | Status: DC | PRN

## 2016-10-11 MED ORDER — SCOPOLAMINE 1 MG/3DAYS TD PT72
1.0000 | MEDICATED_PATCH | Freq: Once | TRANSDERMAL | Status: AC
  Administered 2016-10-11: 1.5 mg via TRANSDERMAL

## 2016-10-11 MED ORDER — SIMETHICONE 80 MG PO CHEW
80.0000 mg | CHEWABLE_TABLET | ORAL | Status: DC
  Administered 2016-10-11 – 2016-10-14 (×3): 80 mg via ORAL
  Filled 2016-10-11 (×3): qty 1

## 2016-10-11 MED ORDER — PHENYLEPHRINE 8 MG IN D5W 100 ML (0.08MG/ML) PREMIX OPTIME
INJECTION | INTRAVENOUS | Status: DC | PRN
  Administered 2016-10-11: 80 ug/min via INTRAVENOUS

## 2016-10-11 MED ORDER — COCONUT OIL OIL: 1.0000 "application " | TOPICAL_OIL | Status: DC | PRN

## 2016-10-11 MED ORDER — LACTATED RINGERS IV SOLN
INTRAVENOUS | Status: DC
  Administered 2016-10-11: 20:00:00 via INTRAVENOUS

## 2016-10-11 MED ORDER — PHENYLEPHRINE HCL 10 MG/ML IJ SOLN
INTRAMUSCULAR | Status: DC | PRN
  Administered 2016-10-11 (×3): 40 ug via INTRAVENOUS

## 2016-10-11 MED ORDER — OXYCODONE HCL 5 MG PO TABS
10.0000 mg | ORAL_TABLET | ORAL | Status: DC | PRN
Start: 2016-10-11 — End: 2016-10-14
  Administered 2016-10-12: 10 mg via ORAL
  Filled 2016-10-11: qty 2

## 2016-10-11 MED ORDER — IBUPROFEN 800 MG PO TABS
800.0000 mg | ORAL_TABLET | Freq: Three times a day (TID) | ORAL | Status: DC
  Administered 2016-10-11 – 2016-10-14 (×8): 800 mg via ORAL
  Filled 2016-10-11 (×8): qty 1

## 2016-10-11 MED ORDER — MEPERIDINE HCL 25 MG/ML IJ SOLN: 6.2500 mg | INTRAMUSCULAR | Status: DC | PRN

## 2016-10-11 MED ORDER — SIMETHICONE 80 MG PO CHEW: 80.0000 mg | CHEWABLE_TABLET | ORAL | Status: DC | PRN

## 2016-10-11 MED ORDER — DIPHENHYDRAMINE HCL 50 MG/ML IJ SOLN: 12.5000 mg | INTRAMUSCULAR | Status: DC | PRN

## 2016-10-11 MED ORDER — FENTANYL CITRATE (PF) 100 MCG/2ML IJ SOLN
INTRAMUSCULAR | Status: DC | PRN
  Administered 2016-10-11: 25 ug via INTRATHECAL

## 2016-10-11 MED ORDER — KETOROLAC TROMETHAMINE 30 MG/ML IJ SOLN
30.0000 mg | Freq: Four times a day (QID) | INTRAMUSCULAR | Status: AC | PRN
  Administered 2016-10-11: 30 mg via INTRAMUSCULAR

## 2016-10-11 MED ORDER — NALBUPHINE SYRINGE 5 MG/0.5 ML: 5.0000 mg | INJECTION | Freq: Once | INTRAMUSCULAR | Status: DC | PRN

## 2016-10-11 MED ORDER — BUPIVACAINE IN DEXTROSE 0.75-8.25 % IT SOLN
INTRATHECAL | Status: AC
  Filled 2016-10-11: qty 2

## 2016-10-11 MED ORDER — ONDANSETRON HCL 4 MG/2ML IJ SOLN
INTRAMUSCULAR | Status: DC | PRN
  Administered 2016-10-11: 4 mg via INTRAVENOUS

## 2016-10-11 MED ORDER — OXYCODONE HCL 5 MG PO TABS
5.0000 mg | ORAL_TABLET | ORAL | Status: DC | PRN
  Administered 2016-10-12 – 2016-10-13 (×2): 5 mg via ORAL
  Filled 2016-10-11 (×2): qty 1

## 2016-10-11 MED ORDER — ACETAMINOPHEN 325 MG PO TABS
650.0000 mg | ORAL_TABLET | ORAL | Status: DC | PRN
  Administered 2016-10-12 – 2016-10-13 (×4): 650 mg via ORAL
  Filled 2016-10-11 (×4): qty 2

## 2016-10-11 MED ORDER — GLIPIZIDE 5 MG PO TABS
5.0000 mg | ORAL_TABLET | Freq: Every day | ORAL | Status: DC
  Administered 2016-10-12 – 2016-10-14 (×3): 5 mg via ORAL
  Filled 2016-10-11 (×3): qty 1

## 2016-10-11 MED ORDER — LACTATED RINGERS IV SOLN
INTRAVENOUS | Status: DC | PRN
  Administered 2016-10-11: 14:00:00 via INTRAVENOUS

## 2016-10-11 MED ORDER — FENTANYL CITRATE (PF) 100 MCG/2ML IJ SOLN
INTRAMUSCULAR | Status: AC
  Filled 2016-10-11: qty 2

## 2016-10-11 MED ORDER — BUPIVACAINE HCL (PF) 0.75 % IJ SOLN
INTRAMUSCULAR | Status: DC | PRN
  Administered 2016-10-11: 12 mg

## 2016-10-11 MED ORDER — KETOROLAC TROMETHAMINE 30 MG/ML IJ SOLN: 30.0000 mg | Freq: Four times a day (QID) | INTRAMUSCULAR | Status: AC | PRN

## 2016-10-11 MED ORDER — SODIUM CHLORIDE 0.9% FLUSH: 3.0000 mL | INTRAVENOUS | Status: DC | PRN

## 2016-10-11 MED ORDER — KETOROLAC TROMETHAMINE 30 MG/ML IJ SOLN
INTRAMUSCULAR | Status: AC
  Filled 2016-10-11: qty 1

## 2016-10-11 MED ORDER — DIPHENHYDRAMINE HCL 25 MG PO CAPS: 25.0000 mg | ORAL_CAPSULE | ORAL | Status: DC | PRN

## 2016-10-11 MED ORDER — PHENYLEPHRINE 8 MG IN D5W 100 ML (0.08MG/ML) PREMIX OPTIME
INJECTION | INTRAVENOUS | Status: AC
  Filled 2016-10-11: qty 100

## 2016-10-11 MED ORDER — OXYTOCIN 10 UNIT/ML IJ SOLN
INTRAMUSCULAR | Status: DC | PRN
  Administered 2016-10-11: 40 [IU] via INTRAVENOUS

## 2016-10-11 MED ORDER — NALOXONE HCL 2 MG/2ML IJ SOSY
1.0000 ug/kg/h | PREFILLED_SYRINGE | INTRAVENOUS | Status: DC | PRN
  Filled 2016-10-11: qty 2

## 2016-10-11 MED ORDER — MENTHOL 3 MG MT LOZG: 1.0000 | LOZENGE | OROMUCOSAL | Status: DC | PRN

## 2016-10-11 MED ORDER — SCOPOLAMINE 1 MG/3DAYS TD PT72
MEDICATED_PATCH | TRANSDERMAL | Status: AC
  Filled 2016-10-11: qty 1

## 2016-10-11 MED ORDER — SIMETHICONE 80 MG PO CHEW
80.0000 mg | CHEWABLE_TABLET | Freq: Three times a day (TID) | ORAL | Status: DC
  Administered 2016-10-11 – 2016-10-14 (×8): 80 mg via ORAL
  Filled 2016-10-11 (×7): qty 1

## 2016-10-11 MED ORDER — ONDANSETRON HCL 4 MG/2ML IJ SOLN: 4.0000 mg | Freq: Three times a day (TID) | INTRAMUSCULAR | Status: DC | PRN

## 2016-10-11 MED ORDER — NALOXONE HCL 0.4 MG/ML IJ SOLN: 0.4000 mg | INTRAMUSCULAR | Status: DC | PRN

## 2016-10-11 MED ORDER — METFORMIN HCL 850 MG PO TABS
850.0000 mg | ORAL_TABLET | Freq: Every day | ORAL | Status: DC
  Administered 2016-10-11 – 2016-10-13 (×3): 850 mg via ORAL
  Filled 2016-10-11 (×3): qty 1

## 2016-10-11 MED ORDER — TETANUS-DIPHTH-ACELL PERTUSSIS 5-2.5-18.5 LF-MCG/0.5 IM SUSP: 0.5000 mL | Freq: Once | INTRAMUSCULAR | Status: DC

## 2016-10-11 MED ORDER — DIPHENHYDRAMINE HCL 25 MG PO CAPS: 25.0000 mg | ORAL_CAPSULE | Freq: Four times a day (QID) | ORAL | Status: DC | PRN

## 2016-10-11 MED ORDER — WITCH HAZEL-GLYCERIN EX PADS: 1.0000 "application " | MEDICATED_PAD | CUTANEOUS | Status: DC | PRN

## 2016-10-11 MED ORDER — ZOLPIDEM TARTRATE 5 MG PO TABS: 5.0000 mg | ORAL_TABLET | Freq: Every evening | ORAL | Status: DC | PRN

## 2016-10-11 MED ORDER — DIPHENHYDRAMINE HCL 25 MG PO CAPS
25.0000 mg | ORAL_CAPSULE | ORAL | Status: DC | PRN
  Filled 2016-10-11: qty 1

## 2016-10-11 MED ORDER — MORPHINE SULFATE (PF) 0.5 MG/ML IJ SOLN
INTRAMUSCULAR | Status: DC | PRN
  Administered 2016-10-11: .2 mg via INTRATHECAL

## 2016-10-11 MED ORDER — OXYTOCIN 40 UNITS IN LACTATED RINGERS INFUSION - SIMPLE MED
2.5000 [IU]/h | INTRAVENOUS | Status: AC
  Administered 2016-10-11: 2.5 [IU]/h via INTRAVENOUS
  Filled 2016-10-11: qty 1000

## 2016-10-11 MED ORDER — DIBUCAINE 1 % RE OINT: 1.0000 "application " | TOPICAL_OINTMENT | RECTAL | Status: DC | PRN

## 2016-10-11 MED ORDER — SENNOSIDES-DOCUSATE SODIUM 8.6-50 MG PO TABS
2.0000 | ORAL_TABLET | ORAL | Status: DC
  Administered 2016-10-11 – 2016-10-14 (×3): 2 via ORAL
  Filled 2016-10-11 (×3): qty 2

## 2016-10-11 MED ORDER — MORPHINE SULFATE (PF) 0.5 MG/ML IJ SOLN
INTRAMUSCULAR | Status: AC
  Filled 2016-10-11: qty 10

## 2016-10-11 MED ORDER — PRENATAL MULTIVITAMIN CH
1.0000 | ORAL_TABLET | Freq: Every day | ORAL | Status: DC
  Administered 2016-10-12 – 2016-10-13 (×2): 1 via ORAL
  Filled 2016-10-11 (×2): qty 1

## 2016-10-11 MED ORDER — ONDANSETRON HCL 4 MG/2ML IJ SOLN
INTRAMUSCULAR | Status: AC
  Filled 2016-10-11: qty 2

## 2016-10-11 MED ORDER — LABETALOL HCL 100 MG PO TABS
100.0000 mg | ORAL_TABLET | Freq: Two times a day (BID) | ORAL | Status: DC
  Administered 2016-10-11: 100 mg via ORAL
  Filled 2016-10-11 (×2): qty 1

## 2016-10-11 SURGICAL SUPPLY — 35 items
BENZOIN TINCTURE PRP APPL 2/3 (GAUZE/BANDAGES/DRESSINGS) ×2 IMPLANT
CHLORAPREP W/TINT 26ML (MISCELLANEOUS) ×2 IMPLANT
CLAMP CORD UMBIL (MISCELLANEOUS) IMPLANT
CLOSURE STERI STRIP 1/2 X4 (GAUZE/BANDAGES/DRESSINGS) ×2 IMPLANT
CLOTH BEACON ORANGE TIMEOUT ST (SAFETY) ×2 IMPLANT
CONTAINER PREFILL 10% NBF 15ML (MISCELLANEOUS) IMPLANT
DRSG OPSITE POSTOP 4X10 (GAUZE/BANDAGES/DRESSINGS) ×2 IMPLANT
ELECT REM PT RETURN 9FT ADLT (ELECTROSURGICAL) ×2
ELECTRODE REM PT RTRN 9FT ADLT (ELECTROSURGICAL) ×1 IMPLANT
EXTRACTOR VACUUM M CUP 4 TUBE (SUCTIONS) ×2 IMPLANT
GLOVE BIO SURGEON STRL SZ 6.5 (GLOVE) ×2 IMPLANT
GLOVE BIOGEL PI IND STRL 7.0 (GLOVE) ×4 IMPLANT
GLOVE BIOGEL PI INDICATOR 7.0 (GLOVE) ×4
GLOVE ECLIPSE 7.0 STRL STRAW (GLOVE) ×2 IMPLANT
GOWN STRL REUS W/TWL LRG LVL3 (GOWN DISPOSABLE) ×4 IMPLANT
KIT ABG SYR 3ML LUER SLIP (SYRINGE) IMPLANT
NEEDLE HYPO 25X5/8 SAFETYGLIDE (NEEDLE) IMPLANT
NS IRRIG 1000ML POUR BTL (IV SOLUTION) ×2 IMPLANT
PACK C SECTION WH (CUSTOM PROCEDURE TRAY) ×2 IMPLANT
PAD OB MATERNITY 4.3X12.25 (PERSONAL CARE ITEMS) ×2 IMPLANT
PENCIL SMOKE EVAC W/HOLSTER (ELECTROSURGICAL) IMPLANT
RTRCTR C-SECT PINK 25CM LRG (MISCELLANEOUS) ×2 IMPLANT
STRIP CLOSURE SKIN 1/2X4 (GAUZE/BANDAGES/DRESSINGS) ×2 IMPLANT
SUT MNCRL 0 VIOLET CTX 36 (SUTURE) ×2 IMPLANT
SUT MONOCRYL 0 CTX 36 (SUTURE) ×2
SUT PLAIN 1 NONE 54 (SUTURE) IMPLANT
SUT PLAIN 2 0 XLH (SUTURE) ×2 IMPLANT
SUT VIC AB 0 CT1 27 (SUTURE) ×2
SUT VIC AB 0 CT1 27XBRD ANBCTR (SUTURE) ×2 IMPLANT
SUT VIC AB 2-0 CT1 27 (SUTURE) ×1
SUT VIC AB 2-0 CT1 TAPERPNT 27 (SUTURE) ×1 IMPLANT
SUT VIC AB 4-0 KS 27 (SUTURE) ×2 IMPLANT
SYR BULB IRRIGATION 50ML (SYRINGE) IMPLANT
TOWEL OR 17X24 6PK STRL BLUE (TOWEL DISPOSABLE) ×2 IMPLANT
TRAY FOLEY BAG SILVER LF 14FR (SET/KITS/TRAYS/PACK) ×2 IMPLANT

## 2016-10-11 NOTE — Anesthesia Postprocedure Evaluation (Signed)
Anesthesia Post Note  Patient: [name]  Procedure(s) Performed: Procedure(s) (LRB): CESAREAN SECTION (N/A)     Patient location during evaluation: PACU Anesthesia Type: Spinal Level of consciousness: oriented and awake and alert Pain management: pain level controlled Vital Signs Assessment: post-procedure vital signs reviewed and stable Respiratory status: spontaneous breathing, respiratory function stable and patient connected to nasal cannula oxygen Cardiovascular status: blood pressure returned to baseline and stable Postop Assessment: no headache and no backache Anesthetic complications: no    Last Vitals:  Vitals:   10/11/16 1518 10/11/16 1519  BP:    Pulse: 78 85  Resp: 20 17  Temp:      Last Pain:  Vitals:   10/11/16 1449  TempSrc:   PainSc: 0-No pain   Pain Goal: Patients Stated Pain Goal: 4 (10/11/16 1217)               Lizandra Zakrzewski

## 2016-10-11 NOTE — Op Note (Signed)
[name], [name] NO.:  [id number]  MEDICAL RECORD NO.:  [id number]  LOCATION:                                FACILITY:  [facility]  PHYSICIAN:  [name], MD        DATE OF BIRTH:  [birth year]  DATE OF PROCEDURE:  10/11/2016 DATE OF DISCHARGE:  10/14/2016                              OPERATIVE REPORT   PREOPERATIVE DIAGNOSES:  Intrauterine pregnancy at 38+ weeks with history of low-transverse cesarean section after shoulder dystocia  for repeat cesarean section, laboring, also hypertension affecting pregnancy, and type 2 diabetes.  POSTOPERATIVE DIAGNOSES:  Intrauterine pregnancy at 38+ weeks with history of low-transverse cesarean section after shoulder dystocia for repeat cesarean section, laboring, also hypertension affecting pregnancy, and type 2 diabetes, delivered.  PROCEDURE:  Repeat low transverse cesarean section.  SURGEON:  [name], MD.  ASSISTANTLuiz Blare, RNFA.  FINDINGS:  Viable female infant at 64 with Apgars of 8 at 1 minute and 9 at 5 minutes and weight pending at the time of dictation.  Normal postpartum uterus, tubes, and ovaries are noted.Some omental uterine adhesions noted.    ANESTHESIA:  Epidural.  IV FLUIDS:  1800 mL.  URINE OUTPUT:  150 mL, clear urine at the end of the procedure.  ESTIMATED BLOOD LOSS:  Approximately 800 mL.  COMPLICATIONS:  None.  PATHOLOGY:  Placenta to L and D.  DESCRIPTION OF PROCEDURE:  After informed consent was reviewed with the patient and her husband, she was transported to the OR.  After spinal anesthesia was placed, she was then returned to the supine position with a leftward tilt.  When it was found to be at an adequate level, she was prepped and draped in the normal sterile fashion.  A Foley catheter was sterilely placed.  A Pfannenstiel skin incision was made at the level of her previous incision, carried through to the underlying layer of fascia sharply.  The fascia was  incised in the midline.  The incision was extended laterally with Mayo scissors.  The superior aspect of the fascial incision was grasped with clamps, elevating the rectus muscles, were dissected off both bluntly and sharply.  Midline was easily identified and the incision was extended superiorly and inferiorly with good visualization of the bladder.  The uterus had some adhesions to the peritoneum and omentum.  These were lysed.  An Alexis skin retractor was placed carefully, making sure that no bowel was entrapped.  The uterus was incised in a transverse fashion after exploring it.  An infant was delivered atraumatically from a vertex presentation with the aid of a vacuum.  Nose and mouth were suctioned on the field.  Cord was clamped and cut after a minute.  The infant was handed off to the waiting pediatric staff.  The placenta was expressed from the uterus.  The uterus was cleared of all clot and debris.  Uterine incision was closed in 2 layers of 0 Monocryl, the first of which is running locked and the second as an imbricating layer.  The additional areas of bleeding were controlled with Bovie cautery.  The adnexa were inspected.  Clot and debris were removed. Alexis retractor was removed.  Peritoneum was reapproximated using 2-0 Vicryl.  The fascia was closed with 0 Vicryl overlapping in the midline, closing from either end.  This was noted to be hemostatic.  The subcuticular adipose tissue was made hemostatic with Bovie cautery and the dead space was then closed with 3-0 plain gut. Skin was closed with 4-0 Vicryl on a Keith needle.  Benzoin and Steri- Strips were applied.  The patient tolerated the procedure well.  Sponge, lap, and needle count was correct x2 per the operating room staff.     Sherron MondayJody Bovard, MD     JB/MEDQ  D:  10/11/2016  T:  10/11/2016  Job:  [id number]

## 2016-10-11 NOTE — Anesthesia Postprocedure Evaluation (Signed)
Anesthesia Post Note  Patient: [name]  Procedure(s) Performed: Procedure(s) (LRB): CESAREAN SECTION (N/A)     Patient location during evaluation: PACU Anesthesia Type: Spinal Level of consciousness: oriented and awake and alert Pain management: pain level controlled Vital Signs Assessment: post-procedure vital signs reviewed and stable Respiratory status: spontaneous breathing, respiratory function stable and patient connected to nasal cannula oxygen Cardiovascular status: blood pressure returned to baseline and stable Postop Assessment: no headache and no backache Anesthetic complications: no    Last Vitals:  Vitals:   10/11/16 1518 10/11/16 1519  BP:    Pulse: 78 85  Resp: 20 17  Temp:      Last Pain:  Vitals:   10/11/16 1449  TempSrc:   PainSc: 0-No pain   Pain Goal: Patients Stated Pain Goal: 4 (10/11/16 1217)               Bruna Dills     

## 2016-10-11 NOTE — Anesthesia Postprocedure Evaluation (Signed)
Anesthesia Post Note  Patient: [name]  Procedure(s) Performed: Procedure(s) (LRB): CESAREAN SECTION (N/A)     Patient location during evaluation: PACU Anesthesia Type: Spinal Level of consciousness: oriented and awake and alert Pain management: pain level controlled Vital Signs Assessment: post-procedure vital signs reviewed and stable Respiratory status: spontaneous breathing, respiratory function stable and patient connected to nasal cannula oxygen Cardiovascular status: blood pressure returned to baseline and stable Postop Assessment: no headache and no backache Anesthetic complications: no    Last Vitals:  Vitals:   10/11/16 1518 10/11/16 1519  BP:    Pulse: 78 85  Resp: 20 17  Temp:      Last Pain:  Vitals:   10/11/16 1449  TempSrc:   PainSc: 0-No pain   Pain Goal: Patients Stated Pain Goal: 4 (10/11/16 1217)               Sim Choquette     

## 2016-10-11 NOTE — Addendum Note (Signed)
Addendum  created 10/11/16 1534 by Bethena Midgetddono, Joenathan Sakuma, MD   Sign clinical note

## 2016-10-11 NOTE — Transfer of Care (Signed)
Immediate Anesthesia Transfer of Care Note  Patient: [name]  Procedure(s) Performed: Procedure(s) with comments: CESAREAN SECTION (N/A) - Odelia GageHeather K, RNFA  Patient Location: PACU  Anesthesia Type:Spinal  Level of Consciousness: awake, alert  and oriented  Airway & Oxygen Therapy: Patient Spontanous Breathing  Post-op Assessment: Report given to RN and Post -op Vital signs reviewed and stable  Post vital signs: Reviewed and stable  Last Vitals:  Vitals:   10/11/16 1217  BP: (!) 150/85  Pulse: 93  Resp: 18  Temp: 37.1 C    Last Pain:  Vitals:   10/11/16 1217  TempSrc: Oral  PainSc: 0-No pain      Patients Stated Pain Goal: 4 (10/11/16 1217)  Complications: No apparent anesthesia complications

## 2016-10-11 NOTE — Brief Op Note (Signed)
10/11/2016  2:49 PM  PATIENT:  Ignatius SpeckingAnna Abee  41 y.o. female  PRE-OPERATIVE DIAGNOSIS:  repeat c-section  POST-OPERATIVE DIAGNOSIS:  repeat c-section, laboring  PROCEDURE:  Procedure(s) with comments: CESAREAN SECTION (N/A) - Odelia GageHeather K, RNFA  SURGEON:  Surgeon(s) and Role:    * Bovard-Stuckert, Rosalind Guido, MD - Primary  ASSISTANTS: Webb SilversmithKrietemeyer, Heather RNFA   FINDINGS: viable female infant at 13:59, apgars 8/9, 1 and 5 minutes.  Wt P; nl uterus, tubes and ovaries.    ANESTHESIA:   spinal  EBL:  Total I/O In: 1800 [I.V.:1800] Out: 950 [Urine:150; Blood:800]  BLOOD ADMINISTERED:none  DRAINS: Urinary Catheter (Foley)   LOCAL MEDICATIONS USED:  NONE  SPECIMEN:  Source of Specimen:  Placenta  DISPOSITION OF SPECIMEN:  L&D  COUNTS:  YES  TOURNIQUET:  * No tourniquets in log *  DICTATION: .Other Dictation: Dictation Number [id number]  PLAN OF CARE: Admit to inpatient   PATIENT DISPOSITION:  PACU - hemodynamically stable.   Delay start of Pharmacological VTE agent (>24hrs) due to surgical blood loss or risk of bleeding: yes

## 2016-10-11 NOTE — H&P (Signed)
[name] is a 41 y.o. female G4P2011 at 9738 with ctx and cervical change.  H/O emergency LTCS after Zavanelli procedure, baby passed.  Also HTN and DM.  BP controlled with labetalol.  DM controlled with metformin and NPH 44units QAM /34unit QPM. Also AMA - Panorama - low risk.  LTCS with T to incision scheduled for repeat.  D/w pt r/b/a of rLTCS.  Voices understanding.    OB History    Gravida Para Term Preterm AB Living   3 2 1 1  0 1   SAB TAB Ectopic Multiple Live Births           1    G1 10/10 39wk 8# SVD female G2 12#, failed birth trauma, Zavanelli and emergent LTCS - baby passed away, female SAB G4 present No abn pap No STD  Past Medical History:  Diagnosis Date  . Gestational diabetes    With first pregnancy, glyburide  . HTN complicating peripregnancy, antepartum, third trimester 10/11/2016  . Obesity (BMI 30-39.9)   . Pregnancy induced hypertension   . Uterine scar from previous cesarean delivery affecting pregnancy 10/11/2016  Diabetes type 2  Past Surgical History:  Procedure Laterality Date  . CESAREAN SECTION N/A 04/10/2013   Procedure: CESAREAN SECTION;  Surgeon: Allie BossierMyra C Dove, MD;  Location: [facility];  Service: Obstetrics;  Laterality: N/A;  . PERINEUM REPAIR N/A 04/10/2013   Procedure: EPISIOTOMY REPAIR;  Surgeon: Allie BossierMyra C Dove, MD;  Location: [facility];  Service: Obstetrics;  Laterality: N/A;   Family History: family history includes Cancer in her father.liver; HTN, DM  Social History:  reports that she has never smoked. She has never used smokeless tobacco. She reports that she does not drink alcohol or use drugs. married, nail tech  Meds PNV, NPH insulin 44/34; labetalol 100 bid, metformin All NKDA     Maternal Diabetes: Yes:  Diabetes Type:  Insulin/Medication controlled Genetic Screening: Normal Maternal Ultrasounds/Referrals: Normal Fetal Ultrasounds or other Referrals:  Fetal echo WNL Maternal Substance Abuse:  No Significant Maternal Medications:  Meds include:  Other: metformin, insulin Significant Maternal Lab Results:  Lab values include: Group B Strep positive Other Comments:  Also HTN - labetalol  Review of Systems  Constitutional: Negative.   HENT: Negative.   Eyes: Negative.   Respiratory: Negative.   Cardiovascular: Negative.   Gastrointestinal: Positive for abdominal pain.  Genitourinary: Negative.   Musculoskeletal: Positive for back pain.  Skin: Negative.   Neurological: Negative.   Psychiatric/Behavioral: Negative.    Maternal Medical History:  Reason for admission: Contractions.   Contractions: Onset was 2 days ago.   Frequency: irregular.   Perceived severity is moderate.    Fetal activity: Perceived fetal activity is normal.    Prenatal complications: PIH.   Prenatal Complications - Diabetes: type 2. Diabetes is managed by insulin injections and oral agent (dual therapy).        Last menstrual period 01/17/2016. Maternal Exam:  Uterine Assessment: Contraction frequency is irregular.   Abdomen: Patient reports no abdominal tenderness. Surgical scars: low transverse.   Fundal height is appropriate for gestation.   Estimated fetal weight is 8-9#.   Fetal presentation: vertex  Introitus: Normal vulva. Normal vagina.  Cervix: Cervix evaluated by digital exam.     Physical Exam  Constitutional: She is oriented to person, place, and time. She appears well-developed and well-nourished.  HENT:  Head: Normocephalic and atraumatic.  Cardiovascular: Normal rate and regular rhythm.   Respiratory: Breath sounds normal. No respiratory distress. She  has no wheezes.  GI: Soft. Bowel sounds are normal. There is no tenderness.  Musculoskeletal: Normal range of motion.  Neurological: She is alert and oriented to person, place, and time.  Skin: Skin is warm and dry.  Psychiatric: She has a normal mood and affect. Her behavior is normal.    Prenatal labs: ABO, Rh: O/Positive/-- (01/02 0000) Antibody: Negative (01/02  0000) Rubella: Nonimmune (01/02 0000) RPR: Nonreactive (01/02 0000)  HBsAg: Negative (01/02 0000)  HIV: Non-reactive (01/02 0000)  GBS:  positive  Hgb 12.2/Plt 184/Ur Cx + - Ecoli - TOC neg/ GC neg/Chl neg/ RNI, Hgb electro WNL/essential panel neg/nl NT, nl AFP - Panorama low risk female Hgb A1C = 7.4/ nl anat, ant plac, female   Assessment/Plan: 40yo G4P2011 at 38+ in labor HTN - labetalol 100 bid DM metformin/NPH D/w pt r/b/a of rLTCS for delivery Ancef for prophylaxis  Jamae Tison Bovard-Stuckert 10/11/2016, 12:11 PM

## 2016-10-11 NOTE — Anesthesia Preprocedure Evaluation (Signed)
Anesthesia Evaluation  Patient identified by MRN, date of birth, ID band Patient awake    Reviewed: Allergy & Precautions, H&P , NPO status , Patient's Chart, lab work & pertinent test results, reviewed documented beta blocker date and time   Airway Mallampati: I  TM Distance: >3 FB Neck ROM: full    Dental no notable dental hx.    Pulmonary neg pulmonary ROS,    Pulmonary exam normal breath sounds clear to auscultation       Cardiovascular hypertension, On Medications Normal cardiovascular exam Rhythm:regular Rate:Normal     Neuro/Psych negative neurological ROS  negative psych ROS   GI/Hepatic negative GI ROS, Neg liver ROS,   Endo/Other  negative endocrine ROSdiabetes, Well Controlled, Type 1, Insulin Dependent  Renal/GU negative Renal ROS  negative genitourinary   Musculoskeletal   Abdominal   Peds  Hematology negative hematology ROS (+)   Anesthesia Other Findings   Reproductive/Obstetrics (+) Pregnancy                             Anesthesia Physical Anesthesia Plan  ASA: III  Anesthesia Plan: Spinal   Post-op Pain Management:    Induction:   PONV Risk Score and Plan: 2 and Ondansetron and Dexamethasone  Airway Management Planned:   Additional Equipment:   Intra-op Plan:   Post-operative Plan:   Informed Consent: I have reviewed the patients History and Physical, chart, labs and discussed the procedure including the risks, benefits and alternatives for the proposed anesthesia with the patient or authorized representative who has indicated his/her understanding and acceptance.     Plan Discussed with:   Anesthesia Plan Comments:         Anesthesia Quick Evaluation

## 2016-10-11 NOTE — Anesthesia Procedure Notes (Signed)
Spinal

## 2016-10-11 NOTE — Op Note (Deleted)
  The note originally documented on this encounter has been moved the the encounter in which it belongs.  

## 2016-10-11 NOTE — Anesthesia Procedure Notes (Signed)
Spinal  Patient location during procedure: OR Start time: 10/11/2016 1:35 PM End time: 10/11/2016 1:40 PM Staffing Anesthesiologist: Altheia Shafran Preanesthetic Checklist Completed: patient identified, site marked, surgical consent, pre-op evaluation, timeout performed, IV checked, risks and benefits discussed and monitors and equipment checked Spinal Block Patient position: sitting Prep: DuraPrep Patient monitoring: heart rate, cardiac monitor, continuous pulse ox and blood pressure Approach: midline Location: L4-5 Injection technique: single-shot Needle Needle type: Sprotte  Needle gauge: 24 G Needle length: 9 cm Needle insertion depth: 8 cm Assessment Sensory level: T6

## 2016-10-11 NOTE — Anesthesia Postprocedure Evaluation (Signed)
Anesthesia Post Note  Patient: Nickol Dimitroff  Procedure(s) Performed: Procedure(s) (LRB): CESAREAN SECTION (N/A)     Patient location during evaluation: PACU Anesthesia Type: Spinal Level of consciousness: oriented and awake and alert Pain management: pain level controlled Vital Signs Assessment: post-procedure vital signs reviewed and stable Respiratory status: spontaneous breathing, respiratory function stable and patient connected to nasal cannula oxygen Cardiovascular status: blood pressure returned to baseline and stable Postop Assessment: no headache and no backache Anesthetic complications: no    Last Vitals:  Vitals:   10/11/16 1518 10/11/16 1519  BP:    Pulse: 78 85  Resp: 20 17  Temp:      Last Pain:  Vitals:   10/11/16 1449  TempSrc:   PainSc: 0-No pain   Pain Goal: Patients Stated Pain Goal: 4 (10/11/16 1217)               Caress Reffitt     

## 2016-10-11 NOTE — Addendum Note (Signed)
Addendum  created 10/11/16 1535 by Bethena Midgetddono, Jaymien Landin, MD   Sign clinical note

## 2016-10-12 LAB — CBC
HEMATOCRIT: 28.8 % — AB (ref 36.0–46.0)
Hemoglobin: 9.9 g/dL — ABNORMAL LOW (ref 12.0–15.0)
MCH: 29.3 pg (ref 26.0–34.0)
MCHC: 34.4 g/dL (ref 30.0–36.0)
MCV: 85.2 fL (ref 78.0–100.0)
Platelets: 120 10*3/uL — ABNORMAL LOW (ref 150–400)
RBC: 3.38 MIL/uL — ABNORMAL LOW (ref 3.87–5.11)
RDW: 14.9 % (ref 11.5–15.5)
WBC: 10.2 10*3/uL (ref 4.0–10.5)

## 2016-10-12 LAB — GLUCOSE, CAPILLARY
GLUCOSE-CAPILLARY: 102 mg/dL — AB (ref 65–99)
GLUCOSE-CAPILLARY: 178 mg/dL — AB (ref 65–99)
Glucose-Capillary: 88 mg/dL (ref 65–99)
Glucose-Capillary: 179 mg/dL — ABNORMAL HIGH (ref 65–99)

## 2016-10-12 LAB — RPR: RPR Ser Ql: NONREACTIVE

## 2016-10-12 LAB — BIRTH TISSUE RECOVERY COLLECTION (PLACENTA DONATION)

## 2016-10-12 MED ORDER — LABETALOL HCL 100 MG PO TABS
100.0000 mg | ORAL_TABLET | Freq: Two times a day (BID) | ORAL | Status: DC
  Filled 2016-10-12: qty 1

## 2016-10-12 NOTE — Addendum Note (Signed)
Addendum  created 10/12/16 0741 by Elgie CongoMalinova, Desere Gwin H, CRNA   Sign clinical note

## 2016-10-12 NOTE — Anesthesia Postprocedure Evaluation (Signed)
Anesthesia Post Note  Patient: Ignatius Speckingnna Lukasik  Procedure(s) Performed: Procedure(s) (LRB): CESAREAN SECTION (N/A)     Patient location during evaluation: Mother Baby Anesthesia Type: Spinal Level of consciousness: awake and alert and oriented Pain management: pain level controlled Vital Signs Assessment: post-procedure vital signs reviewed and stable Respiratory status: spontaneous breathing and nonlabored ventilation Cardiovascular status: stable Postop Assessment: no headache, no backache, spinal receding, no signs of nausea or vomiting, patient able to bend at knees and adequate PO intake Anesthetic complications: no    Last Vitals:  Vitals:   10/11/16 2340 10/12/16 0311  BP: 136/70 (!) 100/51  Pulse: 84 74  Resp: 18 18  Temp:  37.2 C    Last Pain:  Vitals:   10/12/16 0615  TempSrc:   PainSc: 0-No pain   Pain Goal: Patients Stated Pain Goal: 4 (10/11/16 1217)               Laban EmperorMalinova,Jasaun Carn Hristova

## 2016-10-12 NOTE — Progress Notes (Signed)
Rn did not give the labetalol at 2200. Patient's bp was 114/68  Pulse 70.

## 2016-10-12 NOTE — Lactation Note (Signed)
This note was copied from a baby's chart. Lactation Consultation Note: Mother is an experienced breastfeeding mother for 8 months with older child.  Mother reports that she doesn't have milk yet. Assist mother with hand expression and observed a few drops of colostrum. Advised mother to breastfeed infant on cue. Mother was given a harmony hand pump and advised to post pump for 15 mins on each breast. Mother offered a DEBP  and declined. Advised mother to do frequent skin to skin to keep infant awake for feeding. Mother informed of available LC services . Lactation Brochure given.   Patient Name: [name] ZOXWR'UToday's Date: 10/12/2016 Reason for consult: Initial assessment   Maternal Data Has patient been taught Hand Expression?: Yes Does the patient have breastfeeding experience prior to this delivery?: Yes  Feeding Feeding Type: Bottle Fed - Formula Length of feed: 20 min  LATCH Score/Interventions                      Lactation Tools Discussed/Used     Consult Status Consult Status: Follow-up Date: 10/12/16 Follow-up type: In-patient    Stevan BornKendrick, Deanna Nilan [facility] 10/12/2016, 2:39 PM

## 2016-10-12 NOTE — Progress Notes (Signed)
Subjective: Postpartum Day 1 Cesarean Delivery Patient reports tolerating PO and no problems voiding.  Breastfeeding and supplementing  Objective: Vital signs in last 24 hours: Temp:  [97.8 F (36.6 C)-98.9 F (37.2 C)] 97.9 F (36.6 C) (07/03 0850) Pulse Rate:  [69-93] 74 (07/03 0850) Resp:  [13-24] 16 (07/03 0850) BP: (93-150)/(51-102) 93/54 (07/03 0850) SpO2:  [94 %-100 %] 99 % (07/03 0850) Weight:  [79.4 kg (175 lb)] 79.4 kg (175 lb) (07/02 1217)  Physical Exam:  General: alert and cooperative Lochia: appropriate Uterine Fundus: firm Incision: C/D/I    Recent Labs  10/11/16 1210 10/12/16 0533  HGB 13.0 9.9*  HCT 38.6 28.8*   FBS not done this AM, CBG's ordered One hour pp breakfast 178  Assessment/Plan: Status post Cesarean section. Doing well postoperatively.  Pt placed on glipizide 5mg  po q AM and metformin 850mg  po qhs.  She states she thinks she took the glipizide BID prior to pregnancy.  CBG's reordered and will bump up to BID if needed.  Labetalol 100mg  po BID for CHTN and was stable on this dose entire pregnancy--BP a bit low this AM at 93/54 probably just from volume loss with c-section so given parameters to hold for BP < 120/65  Oliver PilaKathy W Josefina Rynders 10/12/2016, 9:58 AM

## 2016-10-12 NOTE — Plan of Care (Signed)
Problem: Activity: Goal: Risk for activity intolerance will decrease Discussed the importance of ambulating in hallway today at least three times.   Problem: Nutritional: Goal: Mothers verbalization of comfort with breastfeeding process will improve Outcome: Completed/Met Date Met: 10/12/16 Encouraged patient to call for assistance with breast feeding. Encouraged patient to always attempt to put baby to the breast prior to bottle feeding.

## 2016-10-13 LAB — GLUCOSE, CAPILLARY
GLUCOSE-CAPILLARY: 97 mg/dL (ref 65–99)
GLUCOSE-CAPILLARY: 125 mg/dL — AB (ref 65–99)
GLUCOSE-CAPILLARY: 194 mg/dL — AB (ref 65–99)
Glucose-Capillary: 172 mg/dL — ABNORMAL HIGH (ref 65–99)

## 2016-10-13 MED ORDER — METFORMIN HCL 850 MG PO TABS: 850.0000 mg | ORAL_TABLET | Freq: Every day | ORAL | 1 refills | Status: DC

## 2016-10-13 MED ORDER — IBUPROFEN 800 MG PO TABS: 800.0000 mg | ORAL_TABLET | Freq: Three times a day (TID) | ORAL | 0 refills | Status: DC

## 2016-10-13 MED ORDER — GLIPIZIDE 5 MG PO TABS: 5.0000 mg | ORAL_TABLET | Freq: Every day | ORAL | 1 refills | Status: DC

## 2016-10-13 MED ORDER — OXYCODONE HCL 5 MG PO TABS: 5.0000 mg | ORAL_TABLET | ORAL | 0 refills | Status: DC | PRN

## 2016-10-13 NOTE — Lactation Note (Signed)
This note was copied from a baby's chart. Lactation Consultation Note  Patient Name: [name] FAOZH'YToday's Date: 10/13/2016 Reason for consult: Follow-up assessment  Baby 48 hours old. Mom reports that she is not seeing very much when she pumps. Mom has manual pump at bedside, and reports that she is giving baby formula until her milk comes to volume. Mom agreeable to use of DEBP. Set mom up with DEBP, demonstrated how to assemble, disassemble and clean pumping parts. Assisted mom to start use of pump and enc pumping every 2-3 hours for 15 minutes followed by hand expression. Enc parents to give baby any EBM as it is pumped, and discussed EBM storage guidelines. Discussed assessment and interventions with Marcelino DusterMichelle, RN.   Maternal Data    Feeding    LATCH Score/Interventions                      Lactation Tools Discussed/Used     Consult Status Consult Status: Follow-up Date: 10/14/16 Follow-up type: In-patient    Sherlyn HayJennifer D Lucyann Romano 10/13/2016, 2:45 PM

## 2016-10-13 NOTE — Progress Notes (Signed)
Patient ID: [name], female   DOB: 1975-09-20, 41 y.o.   MRN: [id number] POD #2 Doing well, some pain but ok, wants to go home if baby ok to go Afeb, VSS, BP normal-too low to get Labetalol CBG 88-179 Abd- soft, fundus firm, incision intact D/c home if baby ok to go, will d/c labetalol, continue glipizide and metformin

## 2016-10-13 NOTE — Discharge Instructions (Signed)
As per discharge pamphlet °

## 2016-10-14 LAB — GLUCOSE, CAPILLARY: Glucose-Capillary: 108 mg/dL — ABNORMAL HIGH (ref 65–99)

## 2016-10-14 MED ORDER — MEASLES, MUMPS & RUBELLA VAC ~~LOC~~ INJ
0.5000 mL | INJECTION | Freq: Once | SUBCUTANEOUS | Status: AC
  Administered 2016-10-14: 0.5 mL via SUBCUTANEOUS
  Filled 2016-10-14 (×2): qty 0.5

## 2016-10-14 NOTE — Discharge Summary (Signed)
OB Discharge Summary     Patient Name: [name] DOB: 11-07-1975 MRN: [id number]  Date of admission: 10/11/2016 Delivering MD: Sherian ReinBOVARD-STUCKERT, JODY   Date of discharge: 10/14/2016  Admitting diagnosis: repeat c-section Intrauterine pregnancy: 2264w2d     Secondary diagnosis:  Principal Problem:   Uterine scar from previous cesarean delivery affecting pregnancy Active Problems:   HTN complicating peripregnancy, antepartum, third trimester   S/P cesarean section  Additional problems: none     Discharge diagnosis: Term Pregnancy Delivered, CHTN and GDM A2                                                                                                Post partum procedures:none   Complications: None  Hospital course:  Sceduled C/S   41 y.o. yo [id number] at 5864w2d was admitted to the hospital 10/11/2016 for scheduled cesarean section with the following indication:Elective Repeat.  Membrane Rupture Time/Date:   ,    Patient delivered a Viable infant.10/11/2016  Details of operation can be found in separate operative note.  Pateint had an uncomplicated postpartum course.  She is ambulating, tolerating a regular diet, passing flatus, and urinating well. Patient is discharged home in stable condition on  10/14/16         Physical exam  Vitals:   10/12/16 2214 10/13/16 0558 10/13/16 1837 10/14/16 0607  BP: 114/68 (!) 103/53 123/72 124/75  Pulse: 70 72 85 76  Resp:  18 18 18   Temp:  98.3 F (36.8 C) 99.5 F (37.5 C) 98.1 F (36.7 C)  TempSrc:  Oral Oral Axillary  SpO2:    100%  Weight:      Height:       General: alert, cooperative and no distress Lochia: appropriate Uterine Fundus: firm Incision: Dressing is clean, dry, and intact DVT Evaluation: No evidence of DVT seen on physical exam. Labs: Lab Results  Component Value Date   WBC 10.2 10/12/2016   HGB 9.9 (L) 10/12/2016   HCT 28.8 (L) 10/12/2016   MCV 85.2 10/12/2016   PLT 120 (L) 10/12/2016   CMP Latest Ref Rng & Units  12/16/2015  Glucose 65 - 99 mg/dL 147(W141(H)  BUN 7 - 25 mg/dL 11  Creatinine 2.950.50 - 6.211.10 mg/dL 3.080.60  Sodium 657135 - 846146 mmol/L 137  Potassium 3.5 - 5.3 mmol/L 3.8  Chloride 98 - 110 mmol/L 101  CO2 20 - 31 mmol/L 23  Calcium 8.6 - 10.2 mg/dL 8.8  Total Protein 6.1 - 8.1 g/dL 7.6  Total Bilirubin 0.2 - 1.2 mg/dL 0.3  Alkaline Phos 33 - 115 U/L 56  AST 10 - 30 U/L 35(H)  ALT 6 - 29 U/L 65(H)    Discharge instruction: per After Visit Summary and "Baby and Me Booklet".  After visit meds:  Allergies as of 10/14/2016   No Known Allergies     Medication List    STOP taking these medications   insulin NPH Human 100 UNIT/ML injection Commonly known as:  HUMULIN N,NOVOLIN N   labetalol 100 MG tablet Commonly known as:  NORMODYNE     TAKE these  medications   glipiZIDE 5 MG tablet Commonly known as:  GLUCOTROL Take 1 tablet (5 mg total) by mouth daily before breakfast.   ibuprofen 800 MG tablet Commonly known as:  ADVIL,MOTRIN Take 1 tablet (800 mg total) by mouth every 8 (eight) hours.   metFORMIN 850 MG tablet Commonly known as:  GLUCOPHAGE Take 850 mg by mouth at bedtime. What changed:  Another medication with the same name was added. Make sure you understand how and when to take each.   metFORMIN 850 MG tablet Commonly known as:  GLUCOPHAGE Take 1 tablet (850 mg total) by mouth at bedtime. What changed:  You were already taking a medication with the same name, and this prescription was added. Make sure you understand how and when to take each.   oxyCODONE 5 MG immediate release tablet Commonly known as:  Oxy IR/ROXICODONE Take 1 tablet (5 mg total) by mouth every 4 (four) hours as needed (pain scale 4-7).   prenatal vitamin w/FE, FA 27-1 MG Tabs tablet Take 1 tablet by mouth daily.       Diet: routine diet  Activity: Advance as tolerated. Pelvic rest for 6 weeks.   Outpatient follow up:1week Follow up Appt:No future appointments. Follow up Visit:No Follow-up on  file.  Postpartum contraception: Undecided  Newborn Data: Live born female  Birth Weight: 8 lb 5.2 oz (3775 g) APGAR: 8, 9  Baby Feeding: Breast Disposition:home with mother   10/14/2016 Cathrine Muster, DO

## 2016-10-14 NOTE — Lactation Note (Signed)
This note was copied from a baby's chart. Lactation Consultation Note  Patient Name: [name] ZOXWR'UToday's Date: 10/14/2016 Reason for consult: Follow-up assessment Mom is breast/bottle feeding by choice. Mom reports she is BF 1st before offering any bottles. Mom's breast are filling. Engorgement care reviewed. Mom denies breast tenderness and does not want help with latch before d/c home. Breast milk storage guidelines discussed, refer to PP booklet for storage guidelines. Call for questions/concerns.   Maternal Data    Feeding Feeding Type: Formula Length of feed: 40 min  LATCH Score/Interventions Latch: Grasps breast easily, tongue down, lips flanged, rhythmical sucking.  Audible Swallowing: A few with stimulation  Type of Nipple: Everted at rest and after stimulation  Comfort (Breast/Nipple): Soft / non-tender     Hold (Positioning): No assistance needed to correctly position infant at breast.  LATCH Score: 9  Lactation Tools Discussed/Used Tools: Pump Breast pump type: Double-Electric Breast Pump   Consult Status Consult Status: Complete Date: 10/14/16 Follow-up type: In-patient    [name], [name] Ann 10/14/2016, 10:25 AM

## 2016-10-14 NOTE — Progress Notes (Signed)
Post Partum Day 3 Subjective: no complaints, up ad lib, voiding, tolerating PO and + flatus. Ready for discharge home today - breastfeeding  Objective: Blood pressure 124/75, pulse 76, temperature 98.1 F (36.7 C), temperature source Axillary, resp. rate 18, height 5\' 1"  (1.549 m), weight 175 lb (79.4 kg), last menstrual period 01/17/2016, SpO2 100 %, unknown if currently breastfeeding.  Physical Exam:  General: alert, cooperative and no distress Lochia: appropriate Uterine Fundus: firm Incision: dressing c/d/i DVT Evaluation: No evidence of DVT seen on physical exam.   Recent Labs  10/11/16 1210 10/12/16 0533  HGB 13.0 9.9*  HCT 38.6 28.8*    Assessment/Plan: Discharge home and Breastfeeding   LOS: 3 days   Cecilia W Banga 10/14/2016, 10:20 AM

## 2016-10-15 ENCOUNTER — Encounter (HOSPITAL_COMMUNITY)
Admission: RE | Admit: 2016-10-15 | Discharge: 2016-10-15 | Disposition: A | Discharge status: Home / Self Care | Payer: BLUE CROSS/BLUE SHIELD | Source: Ambulatory Visit | Attending: Obstetrics and Gynecology | Admitting: Obstetrics and Gynecology

## 2016-10-18 ENCOUNTER — Encounter (HOSPITAL_COMMUNITY): Payer: Self-pay | Admitting: *Deleted

## 2017-02-15 ENCOUNTER — Encounter: Payer: Self-pay | Admitting: Internal Medicine

## 2017-02-15 ENCOUNTER — Ambulatory Visit (INDEPENDENT_AMBULATORY_CARE_PROVIDER_SITE_OTHER): Payer: BLUE CROSS/BLUE SHIELD | Admitting: Internal Medicine

## 2017-02-15 VITALS — BP 140/80 | HR 93 | Temp 97.8°F | Wt 148.6 lb

## 2017-02-15 DIAGNOSIS — Z23 Encounter for immunization: Secondary | ICD-10-CM | POA: Diagnosis not present

## 2017-02-15 DIAGNOSIS — I10 Essential (primary) hypertension: Secondary | ICD-10-CM | POA: Diagnosis not present

## 2017-02-15 DIAGNOSIS — E118 Type 2 diabetes mellitus with unspecified complications: Secondary | ICD-10-CM

## 2017-02-15 LAB — POCT GLYCOSYLATED HEMOGLOBIN (HGB A1C): Hemoglobin A1C: 10.9

## 2017-02-15 MED ORDER — METFORMIN HCL 1000 MG PO TABS: 1000.0000 mg | ORAL_TABLET | Freq: Two times a day (BID) | ORAL | 3 refills | Status: DC

## 2017-02-15 MED ORDER — GLIPIZIDE 10 MG PO TABS: 10.0000 mg | ORAL_TABLET | Freq: Two times a day (BID) | ORAL | 3 refills | Status: DC

## 2017-02-15 NOTE — Patient Instructions (Signed)
[name],  Thank you for coming in today.  I have ordered the following diabetes medications: metformin 1000 mg to take twice daily, glipizide 10 mg to take twice daily. Please see us back in 3 months to recheck your sugars.  Your blood pressure is good today. I recommend doing some regular exercise like walking to keep it under control.  If you cholesterol is high, medication may help. If there is protein in your urine, I would want to start a medication to protect your kidneys.  Best, Dr. Sampson GoonFitzgerald

## 2017-02-15 NOTE — Progress Notes (Signed)
[name] Progress Note  Subjective:  [name] is a 41 y.o. female with history of HTN, T2DM, HLD, microalbuminuria, and death of infant during delivery of previous pregnancy. She presents for check-up.  She gave birth to her third child 10/11/16 by planned rLTCS and is doing well and receiving help from family.  T2DM: - Was on metformin and NPH during pregnancy; discharged on glipizide 5 mg and metformin 850 mg QHS due to CBGs ranging from 88-179 - Says she has been out of medication for a couple months due to not having refills - Prior to pregnancy patient had been on Metformin 850 mg TID and Glipizide 10 mg BID; had also been taking enalapril 2.5 mg but was discontinued when pregnant/trying to conceive - Denies vision changes, says [facility] ROS: Denies increased urinary frequency, abdominal pain  HTN: - Was on labetalol during pregnancy but discontinued after baby delivered  Health Maintenance: Pap smear normal with negative HPV 12/16/15; next due 12/2020; due for flu shot  Social: - does not exercise regularly - never smoker - does not use drugs or drink alcohol  - not planning to become pregnant again; may consider BTL once she is 42 and not sexually active at present  PHQ-2: 0  ROS: negative  No Known Allergies  Social History   Tobacco Use  . Smoking status: Never Smoker  . Smokeless tobacco: Never Used  Substance Use Topics  . Alcohol use: No    Objective: Blood pressure 140/80, pulse 93, temperature 97.8 F (36.6 C), temperature source Oral, weight 148 lb 9.6 oz (67.4 kg), SpO2 97 %, unknown if currently breastfeeding. Body mass index is 28.08 kg/m.  Constitutional: Pleasant, overweight female in NAD HENT: MMM Cardiovascular: RRR, S1, S2, no m/r/g.  Pulmonary/Chest: Effort normal and breath sounds normal.  Abdominal: Soft. +BS, NT Musculoskeletal: No LE edema Neurological: AOx3, no focal deficits. Skin:  Skin is warm and dry. No rash noted.  Psychiatric: Normal mood and affect.  Vitals reviewed  Assessment/Plan: DM type 2 (diabetes mellitus, type 2) - A1c elevated at 10.9 but out of medications.  - Will restart oral medications of metformin 1000 mg to take twice daily, glipizide 10 mg to take twice daily - Will check urine protein, lipid panel  - Will request eye visit records - Recommended trying to fit some regular exercise into her daily routine such as brisk walking with family  HTN (hypertension) - At goal BP today. May consider adding back ACEi if has proteinuria, checking today along with CMP  Flu shot administered.   Follow-up in 3 months for A1c recheck or sooner if CBGs staying in high 200s.  Deanna GobbleHillary Fitzgerald, MD [name], PGY-3

## 2017-02-16 LAB — COMPREHENSIVE METABOLIC PANEL
ALT: 106 IU/L — AB (ref 0–32)
AST: 71 IU/L — AB (ref 0–40)
Albumin/Globulin Ratio: 1.5 (ref 1.2–2.2)
Albumin: 4.8 g/dL (ref 3.5–5.5)
Alkaline Phosphatase: 111 IU/L (ref 39–117)
BUN/Creatinine Ratio: 20 (ref 9–23)
BUN: 11 mg/dL (ref 6–20)
Bilirubin Total: 0.4 mg/dL (ref 0.0–1.2)
CALCIUM: 10.1 mg/dL (ref 8.7–10.2)
CO2: 25 mmol/L (ref 20–29)
CREATININE: 0.56 mg/dL — AB (ref 0.57–1.00)
Chloride: 95 mmol/L — ABNORMAL LOW (ref 96–106)
GFR calc Af Amer: 137 mL/min/{1.73_m2} (ref >59)
GFR, EST NON AFRICAN AMERICAN: 119 mL/min/{1.73_m2} (ref >59)
Globulin, Total: 3.1 g/dL (ref 1.5–4.5)
Glucose: 291 mg/dL — ABNORMAL HIGH (ref 65–99)
Potassium: 4 mmol/L (ref 3.5–5.2)
Sodium: 136 mmol/L (ref 134–144)
Total Protein: 7.9 g/dL (ref 6.0–8.5)

## 2017-02-16 LAB — LIPID PANEL
CHOL/HDL RATIO: 4.4 ratio (ref 0.0–4.4)
Cholesterol, Total: 218 mg/dL — ABNORMAL HIGH (ref 100–199)
HDL: 50 mg/dL (ref >39)
LDL CALC: 134 mg/dL — AB (ref 0–99)
Triglycerides: 169 mg/dL — ABNORMAL HIGH (ref 0–149)
VLDL Cholesterol Cal: 34 mg/dL (ref 5–40)

## 2017-02-16 LAB — MICROALBUMIN, URINE: Microalbumin, Urine: 692.5 ug/mL

## 2017-02-17 ENCOUNTER — Encounter: Payer: Self-pay | Admitting: Internal Medicine

## 2017-02-17 NOTE — Assessment & Plan Note (Addendum)
-   At goal BP today. May consider adding back ACEi if has proteinuria, checking today along with CMP

## 2017-02-17 NOTE — Assessment & Plan Note (Addendum)
-   A1c elevated at 10.9 but out of medications.  - Will restart oral medications of metformin 1000 mg to take twice daily, glipizide 10 mg to take twice daily - Will check urine protein, lipid panel  - Will request eye visit records - Recommended trying to fit some regular exercise into her daily routine such as brisk walking with family

## 2017-02-18 ENCOUNTER — Telehealth: Payer: Self-pay | Admitting: Internal Medicine

## 2017-02-18 ENCOUNTER — Encounter: Payer: Self-pay | Admitting: Internal Medicine

## 2017-02-18 MED ORDER — ENALAPRIL MALEATE 2.5 MG PO TABS: 2.5000 mg | ORAL_TABLET | Freq: Every day | ORAL | 3 refills | Status: DC

## 2017-02-18 MED ORDER — ATORVASTATIN CALCIUM 20 MG PO TABS: 20.0000 mg | ORAL_TABLET | Freq: Every day | ORAL | 3 refills | Status: DC

## 2017-02-18 NOTE — Telephone Encounter (Signed)
Left message for patient informing her of protein in urine and elevated cholesterol. Will order enalapril 2.5 mg and atorvastatin 20 mg for patient to resume from prior to pregnancy. Patient not intending to become pregnant again. Will also send letter with these results.  Dani GobbleHillary Fitzgerald, MD [facility], PGY-3

## 2017-07-06 ENCOUNTER — Other Ambulatory Visit: Payer: Self-pay | Admitting: Internal Medicine

## 2017-07-12 ENCOUNTER — Ambulatory Visit: Payer: BLUE CROSS/BLUE SHIELD | Admitting: Internal Medicine

## 2017-07-14 ENCOUNTER — Encounter: Payer: Self-pay | Admitting: Internal Medicine

## 2017-07-14 ENCOUNTER — Ambulatory Visit: Payer: BLUE CROSS/BLUE SHIELD | Admitting: Internal Medicine

## 2017-07-14 VITALS — BP 130/80 | HR 91 | Temp 97.7°F | Ht 61.0 in | Wt 154.8 lb

## 2017-07-14 DIAGNOSIS — E118 Type 2 diabetes mellitus with unspecified complications: Secondary | ICD-10-CM

## 2017-07-14 LAB — POCT GLYCOSYLATED HEMOGLOBIN (HGB A1C): HEMOGLOBIN A1C: 9.9

## 2017-07-14 MED ORDER — SITAGLIPTIN PHOSPHATE 100 MG PO TABS: 100.0000 mg | ORAL_TABLET | Freq: Every day | ORAL | 0 refills | Status: DC

## 2017-07-14 MED ORDER — METFORMIN HCL 1000 MG PO TABS: 1000.0000 mg | ORAL_TABLET | Freq: Two times a day (BID) | ORAL | 3 refills | Status: DC

## 2017-07-14 MED ORDER — GLIPIZIDE 10 MG PO TABS: 10.0000 mg | ORAL_TABLET | Freq: Every day | ORAL | 0 refills | Status: DC

## 2017-07-14 NOTE — Patient Instructions (Signed)
Ms. [name],  I have changed your glipizide to once daily. The new medicine is jardiance 100 mg daily. Continue metformin twice daily. A pill box may be helpful for remembering. If you feel dizzy or weak, please let me know, as we would need to adjust your medications.  Please see me back in 3 months.  Continue lipitor and enalapril.  Best, Dr. Sampson GoonFitzgerald

## 2017-07-15 ENCOUNTER — Telehealth: Payer: Self-pay

## 2017-07-15 ENCOUNTER — Encounter: Payer: Self-pay | Admitting: Internal Medicine

## 2017-07-15 LAB — COMPREHENSIVE METABOLIC PANEL
A/G RATIO: 1.7 (ref 1.2–2.2)
ALT: 30 IU/L (ref 0–32)
AST: 22 IU/L (ref 0–40)
Albumin: 4.7 g/dL (ref 3.5–5.5)
Alkaline Phosphatase: 76 IU/L (ref 39–117)
BILIRUBIN TOTAL: 0.3 mg/dL (ref 0.0–1.2)
BUN / CREAT RATIO: 23 (ref 9–23)
BUN: 15 mg/dL (ref 6–20)
CHLORIDE: 99 mmol/L (ref 96–106)
CO2: 20 mmol/L (ref 20–29)
Calcium: 9.4 mg/dL (ref 8.7–10.2)
Creatinine, Ser: 0.65 mg/dL (ref 0.57–1.00)
GFR calc non Af Amer: 112 mL/min/{1.73_m2} (ref >59)
GFR, EST AFRICAN AMERICAN: 129 mL/min/{1.73_m2} (ref >59)
Globulin, Total: 2.8 g/dL (ref 1.5–4.5)
Glucose: 209 mg/dL — ABNORMAL HIGH (ref 65–99)
POTASSIUM: 4.4 mmol/L (ref 3.5–5.2)
SODIUM: 138 mmol/L (ref 134–144)
TOTAL PROTEIN: 7.5 g/dL (ref 6.0–8.5)

## 2017-07-15 NOTE — Telephone Encounter (Signed)
Pt called nurse line, states the Januvia is too expensive and she cannot afford it. Copay was $200. Would like to change to something cheaper. Her call back [phone] Shawna OrleansMeredith B Ryann Leavitt, RN

## 2017-07-15 NOTE — Telephone Encounter (Signed)
Called patient to let her know I am looking into alternatives.

## 2017-07-15 NOTE — Assessment & Plan Note (Addendum)
-   Poorly controlled with A1c of 9.9, though improved from 10.9 last November - Stressed importance of being consistent taking medications. Suggested setting alarms on her phone, getting a pill box. Patient says she plans to get pill box. Offered to set up meeting with behavioral health for strategies to achieve better control; patient declines due to lack of time.  - Discussed starting weekly injectable vs SGLT2 inhibitor or DPP4 inhibitor vs insulin. Patient would like to try oral. Jardiance not covered by her insurance. Januvia appears to be. Recommended starting januvia 100 mg daily, reducing glipizide to 10 mg daily, and continuing metformin 1000 mg BID. May have to consider insulin if pricing is too high. - Continue enalapril and lipitor - Increase physical activity with walking

## 2017-07-15 NOTE — Progress Notes (Signed)
[facility] Progress Note  Subjective:  [name] is a 42 y.o. female with T2DM and HLD who presents for follow-up of diabetes. She reports missing days of medication and having run out of glipizide for a week and not being able to have pharmacist place refill request. She says she takes glipizide about 5 out of 7 days and may miss metformin once a week. Says fasting CBGs have been in the range of 140-190s. She says she eats a lot of rice but does not eat many sweets and does not drink soda or juice. [facility] and next visit due in October. She would like to walk more. She is taking enalapril due to proteinuria and uses condoms for birth control. She had to use novolin during her recent pregnancy but hopes to stay on oral regimen. ROS: No abdominal pain, no lightheadedness/jitteryness, no vision changes  No Known Allergies  Social History   Tobacco Use  . Smoking status: Never Smoker  . Smokeless tobacco: Never Used  Substance Use Topics  . Alcohol use: No    Objective: Blood pressure 130/80, pulse 91, temperature 97.7 F (36.5 C), temperature source Oral, height 5\' 1"  (1.549 m), weight 154 lb 12.8 oz (70.2 kg), SpO2 99 %, unknown if currently breastfeeding. Body mass index is 29.25 kg/m. Constitutional: Overweight female, pleasant in NAD Musculoskeletal: No LE edema.  Neurological: Normal monofilament testing.  Psychiatric: Normal mood and affect.  Vitals reviewed  Assessment/Plan: T2DM (type 2 diabetes mellitus) (HCC) - Poorly controlled with A1c of 9.9, though improved from 10.9 last November - Stressed importance of being consistent taking medications. Suggested setting alarms on her phone, getting a pill box. Patient says she plans to get pill box. Offered to set up meeting with behavioral health for strategies to achieve better control; patient declines due to lack of time.  - Discussed starting weekly injectable vs SGLT2  inhibitor or DPP4 inhibitor vs insulin. Patient would like to try oral. Jardiance not covered by her insurance. Januvia appears to be. Recommended starting januvia 100 mg daily, reducing glipizide to 10 mg daily, and continuing metformin 1000 mg BID. May have to consider insulin if pricing is too high. - Continue enalapril and lipitor - Increase physical activity with walking  Follow-up in at least 3 months and by phone with any concerns or fasting CBGs staying above 150s.   Dani GobbleHillary Chinwe Lope, MD [facility], PGY-3

## 2017-07-17 ENCOUNTER — Encounter: Payer: Self-pay | Admitting: Internal Medicine

## 2017-07-20 ENCOUNTER — Other Ambulatory Visit: Payer: Self-pay | Admitting: Internal Medicine

## 2017-07-20 NOTE — Telephone Encounter (Signed)
Called patient to discuss change in diabetes medication and issue with cost. SGLT2, DPP4, GLP1 options are all tier 3 medications that would cost $20 a month on patient's insurance plan once she has met $280 remaining deductible--the medication itself does not typically cost $280 a month. Reached voicemail and left this explanation. Advised her to take metformin 1000 mg twice daily and resume glipizide 10 mg twice daily if she cannot afford paying the deductible. If she plans to go ahead and pay the deductible, I would rather she start an SGLT2 inhibitor rather than the [place] I thought would be covered by her insurance, so I asked that she call me so that I could change the rx.  Olene Floss, MD [place], PGY-3

## 2017-07-20 NOTE — Telephone Encounter (Signed)
Left patient message about deductible being cause of medication price. See telephone note from 07/20/17.

## 2017-09-30 ENCOUNTER — Other Ambulatory Visit: Payer: Self-pay | Admitting: Internal Medicine

## 2017-10-24 ENCOUNTER — Ambulatory Visit: Payer: BLUE CROSS/BLUE SHIELD | Admitting: Family Medicine

## 2017-10-24 ENCOUNTER — Encounter: Payer: Self-pay | Admitting: Family Medicine

## 2017-10-24 VITALS — BP 125/80 | HR 79 | Temp 98.7°F | Ht 61.0 in | Wt 156.0 lb

## 2017-10-24 DIAGNOSIS — E118 Type 2 diabetes mellitus with unspecified complications: Secondary | ICD-10-CM

## 2017-10-24 LAB — POCT GLYCOSYLATED HEMOGLOBIN (HGB A1C): HBA1C, POC (CONTROLLED DIABETIC RANGE): 10.6 % — AB (ref 0.0–7.0)

## 2017-10-24 MED ORDER — ATORVASTATIN CALCIUM 20 MG PO TABS: 20.0000 mg | ORAL_TABLET | Freq: Every day | ORAL | 3 refills | Status: DC

## 2017-10-24 MED ORDER — EMPAGLIFLOZIN 10 MG PO TABS: 10.0000 mg | ORAL_TABLET | Freq: Every day | ORAL | 0 refills | Status: DC

## 2017-10-24 MED ORDER — ENALAPRIL MALEATE 2.5 MG PO TABS: 2.5000 mg | ORAL_TABLET | Freq: Every day | ORAL | 3 refills | Status: DC

## 2017-10-24 NOTE — Patient Instructions (Signed)
It was nice meeting you today.  You were seen in clinic for follow-up of your diabetes.  It appears your A1c has worsened from 9.9 to 10.6 on your current regimen.  I am prescribing you a medication called [place] PepperJardiance which is covered by your insurance.  Please take 10 mg daily and follow-up in 1 month.  At that time I will increase your dose to 25 mg daily.  As we discussed, it is common to get yeast infections while taking this medication so that is something to look out for.  If you have symptoms of vaginal itching, irritation or burning with urination please call clinic and let me know.  We will recheck your A1c in 3 months time.  Be well,  Freddrick MarchYashika Patrisia Faeth MD

## 2017-10-24 NOTE — Progress Notes (Signed)
   Subjective:   Patient ID: [name]    DOB: [birth year], [age] y.o. female   MRN: [id number]  CC: f/u dm  HPI: Deanna Speckingnna Stoneberg is a 42 y.o. female who presents to clinic today for diabetes follow-up.  T2DM Patient here for diabetes follow up.  Taking Metformin 1000 mg BID and glipizide 10 mg BID before meals.    Reports average compliance, sometimes forgets to take her meds 1-2 days a week.   She has not been taking Januvia 100 mg daily, Dr. Sampson GoonFitzgerald prescribed this in April 2019 but pt did not pick it up because it is too expensive for her.   She states she used to use insulin in the past during her pregnancy.  -Diet: eating more junk food and sweets, vacationing x1 month and eating out more, limits carbs, no soda or juices, only water -Exercise: walks outside daily in the afternoon for about 35 min   ROS: No fever, chills, nausea, vomiting, abdominal pain.  No CP, SOB, lightheadedness.    Social: pt is a never smoker  Medications reviewed. Objective:   BP 125/80 (BP Location: Right Arm, Patient Position: Sitting, Cuff Size: Normal)   Pulse 79   Temp 98.7 F (37.1 C) (Oral)   Ht 5\' 1"  (1.549 m)   Wt 156 lb (70.8 kg)   LMP 10/24/2017   SpO2 100%   BMI 29.48 kg/m  Vitals and nursing note reviewed.  General: well nourished, well developed, in no acute distress with non-toxic appearance HEENT: normocephalic, atraumatic, moist mucous membranes Neck: supple, non-tender without lymphadenopathy CV: regular rate and rhythm without murmurs rubs or gallops Lungs: clear to auscultation bilaterally with normal work of breathing Abdomen: soft, non-tender, no masses or organomegaly palpable, normoactive bowel sounds Skin: warm, dry, no rashes or lesions, cap refill < 2 seconds Extremities: warm and well perfused, normal tone  Assessment & Plan:   T2DM (type 2 diabetes mellitus) (HCC) Worsening.  A1c today up 10.6% from last check of 9.9% three months ago.  Discussed importance of medication  adherence as well as lifestyle modifications.  She has not been taking Januvia as this comes out to be about $300 a month with her insurance.  Discussed with pharmacy to check for alternative options that may be more affordable.  Per Dr. Raymondo BandKoval, appears the Januvia should be covered, however will trial Jardiance instead.   -Rx: Jardiance 10 mg daily -Counseled on possibility of yeast infections as anticipatory guidance -Follow-up 1 month, will increase to 25 mg daily at that time -Refill enalapril and Lipitor -Plan to recheck A1c in 3 months  Orders Placed This Encounter  Procedures  . HgB A1c   Meds ordered this encounter  Medications  . enalapril (VASOTEC) 2.5 MG tablet    Sig: Take 1 tablet (2.5 mg total) by mouth daily.    Dispense:  90 tablet    Refill:  3  . atorvastatin (LIPITOR) 20 MG tablet    Sig: Take 1 tablet (20 mg total) by mouth daily.    Dispense:  90 tablet    Refill:  3  . empagliflozin (JARDIANCE) 10 MG TABS tablet    Sig: Take 10 mg by mouth daily.    Dispense:  30 tablet    Refill:  0    Freddrick MarchYashika Ysela Hettinger, MD [facility], PGY-3 10/31/2017 5:59 PM

## 2017-10-31 NOTE — Assessment & Plan Note (Signed)
Worsening.  A1c today up 10.6% from last check of 9.9% three months ago.  Discussed importance of medication adherence as well as lifestyle modifications.  She has not been taking Januvia as this comes out to be about $300 a month with her insurance.  Discussed with pharmacy to check for alternative options that may be more affordable.  Per Dr. Raymondo BandKoval, appears the Januvia should be covered, however will trial Jardiance instead.   -Rx: Jardiance 10 mg daily -Counseled on possibility of yeast infections as anticipatory guidance -Follow-up 1 month, will increase to 25 mg daily at that time -Refill enalapril and Lipitor -Plan to recheck A1c in 3 months

## 2017-11-22 ENCOUNTER — Other Ambulatory Visit: Payer: Self-pay

## 2017-11-22 ENCOUNTER — Ambulatory Visit (INDEPENDENT_AMBULATORY_CARE_PROVIDER_SITE_OTHER): Payer: BLUE CROSS/BLUE SHIELD | Admitting: Family Medicine

## 2017-11-22 ENCOUNTER — Encounter: Payer: Self-pay | Admitting: Family Medicine

## 2017-11-22 VITALS — BP 118/68 | HR 71 | Temp 97.3°F | Ht 61.0 in | Wt 153.8 lb

## 2017-11-22 DIAGNOSIS — T887XXA Unspecified adverse effect of drug or medicament, initial encounter: Secondary | ICD-10-CM

## 2017-11-22 DIAGNOSIS — E118 Type 2 diabetes mellitus with unspecified complications: Secondary | ICD-10-CM | POA: Diagnosis not present

## 2017-11-22 MED ORDER — EMPAGLIFLOZIN 25 MG PO TABS: 25.0000 mg | ORAL_TABLET | Freq: Every day | ORAL | 3 refills | Status: DC

## 2017-11-22 NOTE — Patient Instructions (Signed)
It was nice seeing you again today!  You were seen in clinic for a diabetes follow-up and discussed your side effects to Jardiance.  You can use over-the-counter topical creams for vaginal itching and irritation.  If you feel your symptoms are worse from a yeast infection, please call clinic and I will call in Diflucan for you.  I have refilled this medication for you and have increased it to 25 mg daily.  We will recheck your A1c in 2 months.  Call clinic if you have any questions.  Be well, [name] MarchYashika Blong Busk MD

## 2017-11-22 NOTE — Assessment & Plan Note (Addendum)
Patient has been tolerating Jardiance 10 mg daily well. She is amenable to increasing the medication at 25 mg daily. Patient reports adherence to medication regimen: Atorvastatin 20mg  daily, Enalapril 2.5mg  daily, glipizide 10mg  bid, metformin 1000mg  bid. She is actively working on lifestyle changes: portion control and exercise. Patient has been experiencing intermittent vaginal itching; patient is currently asymptomatic and is not interested in treatment. Encouraged patient to call the office if she has any other complications.  Plan to follow up in two months.

## 2017-11-22 NOTE — Progress Notes (Addendum)
  Subjective:   Patient ID: [name]    DOB: 1975-06-12, 42 y.o. female   MRN: [id number]  Chief Complaint: medication refill    History of Present Illness: Deanna Speckingnna Mand is a 42 y.o. woman with history of hypertension and diabetes type 2 who presents to clinic today for Jardiance refill.   Diabetes Patient was started on Jardiance 10mg  daily at last visit, 10/24/17. Patient reports intermittent vaginal itching with the new medication; she denies dysuria, burning, urgency, frequency, hematuria, vaginal discharge or odor. Patient does not check her blood sugar at home, stating that she forgets. The last measure she can recall is 155 about one week ago. Patient reports feeling hungry when she wakes in the morning; she typically eats white rice to "fill her up". Patient works at a [employer] and sometimes feels "shaky" if she has waited too long to eat; patient keeps small candies with her for such occurrences.  Patient went on a three day vacation last week and left her medications at home. Otherwise, she endorses taking her medications as prescribed.   Review of Systems  Genitourinary: Negative for dysuria, frequency, hematuria, urgency and vaginal discharge.  Neurological: Negative for dizziness, tremors, weakness and headaches.   Social: pt is a never smoker.   Objective:   BP 118/68   Pulse 71   Temp (!) 97.3 F (36.3 C) (Oral)   Ht 5\' 1"  (1.549 m)   Wt 153 lb 12.8 oz (69.8 kg)   LMP 10/24/2017   SpO2 99%   BMI 29.06 kg/m   Physical Exam  Constitutional: She is well-developed, well-nourished, and in no distress.  HENT:  Mouth/Throat: Oropharynx is clear and moist.  Cardiovascular: Normal rate, regular rhythm and normal heart sounds.  Pulmonary/Chest: Effort normal and breath sounds normal. She has no wheezes.   Assessment & Plan:  Deanna Speckingnna Hayse is a 42 y.o. female who presents to clinic today for diabetes follow up.   T2DM (type 2 diabetes mellitus) (HCC) Patient has been tolerating  Jardiance 10 mg daily well. She is amenable to increasing the medication at 25 mg daily. Patient reports adherence to medication regimen: Atorvastatin 20mg  daily, Enalapril 2.5mg  daily, glipizide 10mg  bid, metformin 1000mg  bid. She is actively working on lifestyle changes: portion control and exercise. Patient has been experiencing intermittent vaginal itching; patient is currently asymptomatic and is not interested in treatment. Encouraged patient to call the office if she has any other complications.  Plan to follow up in two months.   Meds ordered this encounter  Medications  . empagliflozin (JARDIANCE) 25 MG TABS tablet    Sig: Take 25 mg by mouth daily.    Dispense:  30 tablet    Refill:  3    Patient seen along with MS3 student Elveria Risingimelie Charlottie Peragine. I personally evaluated this patient along with the student, and verified all aspects of the history, physical exam, and medical decision making as documented by the student. I agree with the student's documentation and have made all necessary edits.  Freddrick MarchYashika Amin, MD  [facility] 11/30/2017 3:28 PM

## 2017-12-20 ENCOUNTER — Telehealth: Payer: Self-pay | Admitting: Family Medicine

## 2017-12-20 NOTE — Telephone Encounter (Signed)
Patient needs to be check for diabetes management by PCP.

## 2017-12-20 NOTE — Telephone Encounter (Signed)
Left message. Patient needs to see PCP about diabetes management.

## 2017-12-27 ENCOUNTER — Other Ambulatory Visit: Payer: Self-pay | Admitting: Internal Medicine

## 2018-01-31 ENCOUNTER — Encounter: Payer: Self-pay | Admitting: Family Medicine

## 2018-01-31 ENCOUNTER — Other Ambulatory Visit: Payer: Self-pay

## 2018-01-31 ENCOUNTER — Ambulatory Visit: Payer: BLUE CROSS/BLUE SHIELD | Admitting: Family Medicine

## 2018-01-31 VITALS — BP 112/62 | HR 80 | Temp 97.6°F | Ht 61.0 in | Wt 152.0 lb

## 2018-01-31 DIAGNOSIS — E119 Type 2 diabetes mellitus without complications: Secondary | ICD-10-CM

## 2018-01-31 LAB — POCT GLYCOSYLATED HEMOGLOBIN (HGB A1C): HBA1C, POC (CONTROLLED DIABETIC RANGE): 8.1 % — AB (ref 0.0–7.0)

## 2018-01-31 MED ORDER — ENALAPRIL MALEATE 2.5 MG PO TABS: 2.5000 mg | ORAL_TABLET | Freq: Every day | ORAL | 3 refills | Status: DC

## 2018-01-31 MED ORDER — EMPAGLIFLOZIN 25 MG PO TABS: 25.0000 mg | ORAL_TABLET | Freq: Every day | ORAL | 3 refills | Status: DC

## 2018-01-31 MED ORDER — METFORMIN HCL 1000 MG PO TABS: 1000.0000 mg | ORAL_TABLET | Freq: Two times a day (BID) | ORAL | 3 refills | Status: DC

## 2018-01-31 MED ORDER — GLIPIZIDE 10 MG PO TABS: ORAL_TABLET | ORAL | 0 refills | Status: DC

## 2018-01-31 NOTE — Progress Notes (Signed)
   Subjective:   Patient ID: [name]    DOB: Aug 18, 1975, 42 y.o. female   MRN: [id number]  CC: T2DM   HPI: [name] is a 42 y.o. female who presents to clinic today for the following issue.  T2DM Last A1c 10.6 three months ago, today 8.1.  Patient feels her blood sugars are better controlled now that she has been trying to diet.  CBGs: Checks once every 1-2 days.  Fasting values range around 110-115.  Medications: Metformin 1000 mg BID, glipizide 10 mg daily, jardiance 25 mg daily Compliance: good  Diet: white rice and vegetables, noodles.  No bread, pizza or pasta.  She drinks sweet tea 2-3 days a week.  She drinks a lot of water.  Limits candy and chocolate.   Exercise: walks around the house everyday  Eye exam: has her yearly appointment on 02/10/18 Foot exam: performed today   ROS: No fever, chills, nausea, vomiting.  No CP, SOB, leg swelling.   Social: pt is a never smoker.  Medications reviewed. Objective:   BP 112/62   Pulse 80   Temp 97.6 F (36.4 C) (Oral)   Ht 5\' 1"  (1.549 m)   Wt 152 lb (68.9 kg)   SpO2 99%   BMI 28.72 kg/m  Vitals and nursing note reviewed.  General: Pleasant 42 year old female, NAD Neck: supple CV: RRR no MRG  Lungs: CTAB, normal effort  Abdomen: soft, NTND,  +bs  Skin: warm, dry, no rash or lesions, (see separate diabetic foot exam) Extremities: warm and well perfused, normal tone Neuro: alert, oriented x3, no focal deficits   Assessment & Plan:   T2DM (type 2 diabetes mellitus) (HCC) A1c improving, 8.1 today from 10.6 three months ago. Discussed carb counting and limiting junk food, increase intake efficiently meats, vegetables.  Limit sweets. Counseled on exercise Diabetic foot exam performed today, no abnormalities noted.  She has a scheduled eye exam on 02/10/2018. Medications refilled as she is going out of the country to visit family in [place] Recheck A1c in 3 months  Orders Placed This Encounter  Procedures  . HgB A1c    Meds ordered this encounter  Medications  . metFORMIN (GLUCOPHAGE) 1000 MG tablet    Sig: Take 1 tablet (1,000 mg total) by mouth 2 (two) times daily with a meal.    Dispense:  180 tablet    Refill:  3  . enalapril (VASOTEC) 2.5 MG tablet    Sig: Take 1 tablet (2.5 mg total) by mouth daily.    Dispense:  90 tablet    Refill:  3  . empagliflozin (JARDIANCE) 25 MG TABS tablet    Sig: Take 25 mg by mouth daily.    Dispense:  30 tablet    Refill:  3  . glipiZIDE (GLUCOTROL) 10 MG tablet    Sig: TAKE 1 TABLET(10 MG) BY MOUTH TWICE DAILY BEFORE A MEAL    Dispense:  180 tablet    Refill:  0   Freddrick March, MD [facility] PGY-3

## 2018-01-31 NOTE — Patient Instructions (Signed)
It was nice seeing you again today.  You were seen in clinic for diabetes follow-up and your A1c has improved today to 8.1 from 10.6 three months ago.  I am not making any changes to your medications today and would like you to take these the same way you have been.  I have refilled your medications as you will be out of the country.  We discussed limiting carbs and increasing your exercise.  We also performed a foot exam today and everything looks normal.  I would like to see you back in 3 months to recheck your A1c.  Please call clinic if you have any questions. Enjoy your trip to [place]!   Freddrick March MD

## 2018-02-07 NOTE — Assessment & Plan Note (Signed)
A1c improving, 8.1 today from 10.6 three months ago. Discussed carb counting and limiting junk food, increase intake efficiently meats, vegetables.  Limit sweets. Counseled on exercise Diabetic foot exam performed today, no abnormalities noted.  She has a scheduled eye exam on 02/10/2018. Medications refilled as she is going out of the country to visit family in [place] Recheck A1c in 3 months

## 2018-07-18 ENCOUNTER — Other Ambulatory Visit: Payer: Self-pay | Admitting: Family Medicine

## 2018-09-04 ENCOUNTER — Other Ambulatory Visit: Payer: Self-pay | Admitting: Family Medicine

## 2018-11-28 ENCOUNTER — Other Ambulatory Visit: Payer: Self-pay | Admitting: *Deleted

## 2018-11-30 MED ORDER — GLIPIZIDE 10 MG PO TABS: 10.0000 mg | ORAL_TABLET | Freq: Two times a day (BID) | ORAL | 3 refills | Status: DC

## 2018-12-19 ENCOUNTER — Other Ambulatory Visit: Payer: Self-pay

## 2018-12-20 MED ORDER — ATORVASTATIN CALCIUM 20 MG PO TABS: 20.0000 mg | ORAL_TABLET | Freq: Every day | ORAL | 3 refills | Status: DC

## 2018-12-22 ENCOUNTER — Other Ambulatory Visit: Payer: Self-pay

## 2018-12-22 ENCOUNTER — Encounter: Payer: Self-pay | Admitting: Student in an Organized Health Care Education/Training Program

## 2018-12-22 ENCOUNTER — Inpatient Hospital Stay (HOSPITAL_COMMUNITY): Admit: 2018-12-22 | Payer: BLUE CROSS/BLUE SHIELD

## 2018-12-22 ENCOUNTER — Ambulatory Visit (INDEPENDENT_AMBULATORY_CARE_PROVIDER_SITE_OTHER): Payer: Self-pay | Admitting: Student in an Organized Health Care Education/Training Program

## 2018-12-22 VITALS — BP 118/80 | HR 93

## 2018-12-22 DIAGNOSIS — E119 Type 2 diabetes mellitus without complications: Secondary | ICD-10-CM

## 2018-12-22 DIAGNOSIS — Z124 Encounter for screening for malignant neoplasm of cervix: Secondary | ICD-10-CM

## 2018-12-22 DIAGNOSIS — Z0000 Encounter for general adult medical examination without abnormal findings: Secondary | ICD-10-CM

## 2018-12-22 DIAGNOSIS — I10 Essential (primary) hypertension: Secondary | ICD-10-CM

## 2018-12-22 DIAGNOSIS — Z23 Encounter for immunization: Secondary | ICD-10-CM

## 2018-12-22 LAB — POCT GLYCOSYLATED HEMOGLOBIN (HGB A1C): HbA1c, POC (controlled diabetic range): 8.7 % — AB (ref 0.0–7.0)

## 2018-12-22 MED ORDER — ENALAPRIL MALEATE 2.5 MG PO TABS: 2.5000 mg | ORAL_TABLET | Freq: Every day | ORAL | 3 refills | Status: DC

## 2018-12-22 MED ORDER — JARDIANCE 25 MG PO TABS: 25.0000 mg | ORAL_TABLET | Freq: Every day | ORAL | 1 refills | Status: DC

## 2018-12-22 MED ORDER — METFORMIN HCL 1000 MG PO TABS: 1000.0000 mg | ORAL_TABLET | Freq: Two times a day (BID) | ORAL | 3 refills | Status: DC

## 2018-12-22 NOTE — Patient Instructions (Signed)
It was a pleasure to see you today!  To summarize our discussion for this visit:  We have checked your Hgb A1c which is 8.1 today and a great improvement. Keep up the good work  You have your eye exam scheduled for November  You will receive a pap smear today to screen for cervical cancer screening  You will receive your flu shot today.   Some additional health maintenance measures we should update are: Health Maintenance Due  Topic Date Due  . OPHTHALMOLOGY EXAM  02/10/2016  . HEMOGLOBIN A1C  08/02/2018  . INFLUENZA VACCINE  11/11/2018  . PAP SMEAR-Modifier  12/16/2018  .    Please return to our clinic to see me in about 6 months.  Call the clinic at [phone] if your symptoms worsen or you have any concerns.   Thank you for allowing me to take part in your care,  Dr. Doristine Mango

## 2018-12-22 NOTE — Progress Notes (Signed)
   Subjective:    Patient ID: [name], female    DOB: [birth year], 43 y.o.   MRN: [id number]   CC: medication refill and A1c check  HPI:  Patient here for medication refill and A1c check today.  Give [place] opportunity to address health maintenance today.  Diabetes mellitus-patient states that she has been increasing her activity level and trying to eat healthier.  Denies any increased thirst or urination frequency.  Checking A1c today.  Last check was 8.1.  Endorses adherence with current medication regimen.  Health maintenance-patient is due for her diabetes eye exam.  She states that she has an appointment in November for her yearly exam.  Denies having any difficulties with vision.  She states that she had a normal checkup last year.  Patient is due for flu shot today and is amenable to getting that in office.  Patient is at 3-year mark for Pap smear and her last exam was negative for malignancy or HPV.  We had a discussion on recommendations for screening interval and patient was in favor of getting tested today.  She states that she is on her menstrual cycle today but still wants to go ahead with the screening.  She denies having any other questions or concerns at this time.  Smoking status reviewed   ROS: pertinent noted in the HPI   I have personally reviewed pertinent past medical history, surgical, family, and social history as appropriate.  Objective:  BP 118/80   Pulse 93   SpO2 99%   Vitals and nursing note reviewed  General: NAD, pleasant, able to participate in exam Pelvic: External genitalia normal.  Cervix posterior with blood and mucus discharge from os.  Negative for friable tissue on cervix.  Negative for tenderness on exam.  Negative for mucosal lesions. Extremities: no edema or cyanosis. Skin: warm and dry, no rashes noted Neuro: alert, no obvious focal deficits Psych: Normal affect and mood   Assessment & Plan:   T2DM (type 2 diabetes mellitus) (HCC) A1c  today increased to 8.7 from 8.1 despite patient endorsing healthier eating and exercise habits. Counseled patient on diet and exercise  Follow-up with eye exam in November return in approximately 6 months for recheck.    Healthcare maintenance Administered flu shot Collected Pap smear for cervical screening today after discussion about interval recommendations and patient preference was to check today Check A1c today  HTN (hypertension) Blood pressure 118/80 this visit. Continue current treatment.  Orders Placed This Encounter  Procedures  . Flu Vaccine QUAD 36+ mos IM  . HgB A1c    Meds ordered this encounter  Medications  . empagliflozin (JARDIANCE) 25 MG TABS tablet    Sig: Take 25 mg by mouth daily.    Dispense:  90 tablet    Refill:  1  . metFORMIN (GLUCOPHAGE) 1000 MG tablet    Sig: Take 1 tablet (1,000 mg total) by mouth 2 (two) times daily with a meal.    Dispense:  180 tablet    Refill:  3  . enalapril (VASOTEC) 2.5 MG tablet    Sig: Take 1 tablet (2.5 mg total) by mouth daily.    Dispense:  90 tablet    Refill:  [place] PGY-2

## 2018-12-22 NOTE — Assessment & Plan Note (Signed)
A1c today increased to 8.7 from 8.1 despite patient endorsing healthier eating and exercise habits. Counseled patient on diet and exercise  Follow-up with eye exam in November return in approximately 6 months for recheck.

## 2018-12-22 NOTE — Assessment & Plan Note (Signed)
Administered flu shot Collected Pap smear for cervical screening today after discussion about interval recommendations and patient preference was to check today Check A1c today

## 2018-12-22 NOTE — Assessment & Plan Note (Signed)
Blood pressure 118/80 this visit. Continue current treatment.

## 2018-12-23 LAB — CYTOLOGY - PAP
Diagnosis: NEGATIVE
HPV: NOT DETECTED

## 2019-03-23 ENCOUNTER — Other Ambulatory Visit: Payer: Self-pay

## 2019-03-28 MED ORDER — ENALAPRIL MALEATE 2.5 MG PO TABS: 2.5000 mg | ORAL_TABLET | Freq: Every day | ORAL | 3 refills | Status: DC

## 2019-06-18 ENCOUNTER — Other Ambulatory Visit: Payer: Self-pay | Admitting: Student in an Organized Health Care Education/Training Program

## 2019-11-16 ENCOUNTER — Other Ambulatory Visit: Payer: Self-pay | Admitting: Student in an Organized Health Care Education/Training Program

## 2019-11-20 ENCOUNTER — Other Ambulatory Visit: Payer: Self-pay | Admitting: Student in an Organized Health Care Education/Training Program

## 2019-12-08 ENCOUNTER — Other Ambulatory Visit: Payer: Self-pay | Admitting: Student in an Organized Health Care Education/Training Program

## 2019-12-10 NOTE — Telephone Encounter (Signed)
LVM for patient to call office to schedule appointment for refills.  Glennie Hawk, CMA

## 2019-12-13 ENCOUNTER — Other Ambulatory Visit: Payer: Self-pay | Admitting: Student in an Organized Health Care Education/Training Program

## 2019-12-13 NOTE — Telephone Encounter (Signed)
Called patient to inform her of RX and she states that she will no longer be coming here.  Her insurance is out of network for [employer].  Patient will be finding a new PCP.  Glennie Hawk, CMA

## 2020-01-15 ENCOUNTER — Other Ambulatory Visit: Payer: Self-pay | Admitting: Student in an Organized Health Care Education/Training Program

## 2020-02-11 LAB — HM DIABETES EYE EXAM

## 2020-03-18 ENCOUNTER — Other Ambulatory Visit: Payer: Self-pay | Admitting: Student in an Organized Health Care Education/Training Program

## 2020-03-22 ENCOUNTER — Other Ambulatory Visit: Payer: Self-pay | Admitting: Student in an Organized Health Care Education/Training Program

## 2020-06-24 ENCOUNTER — Ambulatory Visit (INDEPENDENT_AMBULATORY_CARE_PROVIDER_SITE_OTHER): Payer: 59 | Admitting: Student in an Organized Health Care Education/Training Program

## 2020-06-24 ENCOUNTER — Other Ambulatory Visit: Payer: Self-pay

## 2020-06-24 ENCOUNTER — Encounter: Payer: Self-pay | Admitting: Student in an Organized Health Care Education/Training Program

## 2020-06-24 VITALS — BP 120/79 | HR 85 | Ht 61.0 in | Wt 153.0 lb

## 2020-06-24 DIAGNOSIS — Z0000 Encounter for general adult medical examination without abnormal findings: Secondary | ICD-10-CM

## 2020-06-24 DIAGNOSIS — Z1159 Encounter for screening for other viral diseases: Secondary | ICD-10-CM | POA: Diagnosis not present

## 2020-06-24 DIAGNOSIS — I10 Essential (primary) hypertension: Secondary | ICD-10-CM

## 2020-06-24 DIAGNOSIS — E119 Type 2 diabetes mellitus without complications: Secondary | ICD-10-CM

## 2020-06-24 DIAGNOSIS — Z1231 Encounter for screening mammogram for malignant neoplasm of breast: Secondary | ICD-10-CM

## 2020-06-24 LAB — POCT GLYCOSYLATED HEMOGLOBIN (HGB A1C): HbA1c, POC (controlled diabetic range): 8.7 % — AB (ref 0.0–7.0)

## 2020-06-24 MED ORDER — ATORVASTATIN CALCIUM 40 MG PO TABS: 20.0000 mg | ORAL_TABLET | Freq: Every day | ORAL | 1 refills | Status: DC

## 2020-06-24 MED ORDER — METFORMIN HCL 1000 MG PO TABS: 1000.0000 mg | ORAL_TABLET | Freq: Two times a day (BID) | ORAL | 3 refills | Status: DC

## 2020-06-24 MED ORDER — DAPAGLIFLOZIN PROPANEDIOL 10 MG PO TABS: 10.0000 mg | ORAL_TABLET | Freq: Every day | ORAL | 1 refills | Status: DC

## 2020-06-24 MED ORDER — ENALAPRIL MALEATE 2.5 MG PO TABS: ORAL_TABLET | ORAL | 3 refills | Status: DC

## 2020-06-24 NOTE — Patient Instructions (Signed)
It was a pleasure to see you today!  To summarize our discussion for this visit:  For your diabetes:  Your A1c is still elevated  We are stopping glipizide  Increase metformin to 1,000mg  twice per day  Increasing farxiga to 10mg  daily  Please continue to eat a healthy diet based mostly in vegetables  I think we will need an additional medication to get good control of your blood sugars but we can check again in 3 months and decide at that time  I ordered a mammogram  We are increasing your cholesterol medication to 40mg  daily and checking your cholesterol today     Some additional health maintenance measures we should update are: Health Maintenance Due  Topic Date Due  . Hepatitis C Screening  Never done  . COVID-19 Vaccine (1) Never done  . OPHTHALMOLOGY EXAM  02/10/2016  . FOOT EXAM  02/01/2019  .    Please return to our clinic to see me in 3 months.  Call the clinic at [phone] if your symptoms worsen or you have any concerns.   Thank you for allowing me to take part in your care,  Dr. 02/03/2019   [phone]">  DASH Eating Plan DASH stands for Dietary Approaches to Stop Hypertension. The DASH eating plan is a healthy eating plan that has been shown to:  Reduce high blood pressure (hypertension).  Reduce your risk for type 2 diabetes, heart disease, and stroke.  Help with weight loss. What are tips for following this plan? Reading food labels  Check food labels for the amount of salt (sodium) per serving. Choose foods with less than 5 percent of the Daily Value of sodium. Generally, foods with less than 300 milligrams (mg) of sodium per serving fit into this eating plan.  To find whole grains, look for the word "whole" as the first word in the ingredient list. Shopping  Buy products labeled as "low-sodium" or "no salt added."  Buy fresh foods. Avoid canned foods and pre-made or frozen  meals. Cooking  Avoid adding salt when cooking. Use salt-free seasonings or herbs instead of table salt or sea salt. Check with your health care provider or pharmacist before using salt substitutes.  Do not fry foods. Cook foods using healthy methods such as baking, boiling, grilling, roasting, and broiling instead.  Cook with heart-healthy oils, such as olive, canola, avocado, soybean, or sunflower oil. Meal planning  Eat a balanced diet that includes: ? 4 or more servings of fruits and 4 or more servings of vegetables each day. Try to fill one-half of your plate with fruits and vegetables. ? 6-8 servings of whole grains each day. ? Less than 6 oz (170 g) of lean meat, poultry, or fish each day. A 3-oz (85-g) serving of meat is about the same size as a deck of cards. One egg equals 1 oz (28 g). ? 2-3 servings of low-fat dairy each day. One serving is 1 cup (237 mL). ? 1 serving of nuts, seeds, or beans 5 times each week. ? 2-3 servings of heart-healthy fats. Healthy fats called omega-3 fatty acids are found in foods such as walnuts, flaxseeds, fortified milks, and eggs. These fats are also found in cold-water fish, such as sardines, salmon, and mackerel.  Limit how much you eat of: ? Canned or prepackaged foods. ? Food that is high in trans fat, such as some fried foods. ? Food that is high in saturated fat, such as fatty meat. ? Desserts and other  sweets, sugary drinks, and other foods with added sugar. ? Full-fat dairy products.  Do not salt foods before eating.  Do not eat more than 4 egg yolks a week.  Try to eat at least 2 vegetarian meals a week.  Eat more home-cooked food and less restaurant, buffet, and fast food.   Lifestyle  When eating at a restaurant, ask that your food be prepared with less salt or no salt, if possible.  If you drink alcohol: ? Limit how much you use to:  0-1 drink a day for women who are not pregnant.  0-2 drinks a day for men. ? Be aware of  how much alcohol is in your drink. In the [place] one drink equals one 12 oz bottle of beer (355 mL), one 5 oz glass of wine (148 mL), or one 1 oz glass of hard liquor (44 mL). General information  Avoid eating more than 2,300 mg of salt a day. If you have hypertension, you may need to reduce your sodium intake to 1,500 mg a day.  Work with your health care provider to maintain a healthy body weight or to lose weight. Ask what an ideal weight is for you.  Get at least 30 minutes of exercise that causes your heart to beat faster (aerobic exercise) most days of the week. Activities may include walking, swimming, or biking.  Work with your health care provider or dietitian to adjust your eating plan to your individual calorie needs. What foods should I eat? Fruits All fresh, dried, or frozen fruit. Canned fruit in natural juice (without added sugar). Vegetables Fresh or frozen vegetables (raw, steamed, roasted, or grilled). Low-sodium or reduced-sodium tomato and vegetable juice. Low-sodium or reduced-sodium tomato sauce and tomato paste. Low-sodium or reduced-sodium canned vegetables. Grains Whole-grain or whole-wheat bread. Whole-grain or whole-wheat pasta. Brown rice. Orpah Cobb. Bulgur. Whole-grain and low-sodium cereals. Pita bread. Low-fat, low-sodium crackers. Whole-wheat flour tortillas. Meats and other proteins Skinless chicken or [place]. Ground chicken or [place]. Pork with fat trimmed off. Fish and seafood. Egg whites. Dried beans, peas, or lentils. Unsalted nuts, nut butters, and seeds. Unsalted canned beans. Lean cuts of beef with fat trimmed off. Low-sodium, lean precooked or cured meat, such as sausages or meat loaves. Dairy Low-fat (1%) or fat-free (skim) milk. Reduced-fat, low-fat, or fat-free cheeses. Nonfat, low-sodium ricotta or cottage cheese. Low-fat or nonfat yogurt. Low-fat, low-sodium cheese. Fats and oils Soft margarine without trans fats. Vegetable oil. Reduced-fat,  low-fat, or light mayonnaise and salad dressings (reduced-sodium). Canola, safflower, olive, avocado, soybean, and sunflower oils. Avocado. Seasonings and condiments Herbs. Spices. Seasoning mixes without salt. Other foods Unsalted popcorn and pretzels. Fat-free sweets. The items listed above may not be a complete list of foods and beverages you can eat. Contact a dietitian for more information. What foods should I avoid? Fruits Canned fruit in a light or heavy syrup. Fried fruit. Fruit in cream or butter sauce. Vegetables Creamed or fried vegetables. Vegetables in a cheese sauce. Regular canned vegetables (not low-sodium or reduced-sodium). Regular canned tomato sauce and paste (not low-sodium or reduced-sodium). Regular tomato and vegetable juice (not low-sodium or reduced-sodium). Rosita Fire. Olives. Grains Baked goods made with fat, such as croissants, muffins, or some breads. Dry pasta or rice meal packs. Meats and other proteins Fatty cuts of meat. Ribs. Fried meat. Tomasa Blase. Bologna, salami, and other precooked or cured meats, such as sausages or meat loaves. Fat from the back of a pig (fatback). Bratwurst. Salted nuts and seeds. Canned beans  with added salt. Canned or smoked fish. Whole eggs or egg yolks. Chicken or [place] with skin. Dairy Whole or 2% milk, cream, and half-and-half. Whole or full-fat cream cheese. Whole-fat or sweetened yogurt. Full-fat cheese. Nondairy creamers. Whipped toppings. Processed cheese and cheese spreads. Fats and oils Butter. Stick margarine. Lard. Shortening. Ghee. Bacon fat. Tropical oils, such as coconut, palm kernel, or palm oil. Seasonings and condiments Onion salt, garlic salt, seasoned salt, table salt, and sea salt. Worcestershire sauce. Tartar sauce. Barbecue sauce. Teriyaki sauce. Soy sauce, including reduced-sodium. Steak sauce. Canned and packaged gravies. Fish sauce. Oyster sauce. Cocktail sauce. Store-bought horseradish. Ketchup. Mustard. Meat  flavorings and tenderizers. Bouillon cubes. Hot sauces. Pre-made or packaged marinades. Pre-made or packaged taco seasonings. Relishes. Regular salad dressings. Other foods Salted popcorn and pretzels. The items listed above may not be a complete list of foods and beverages you should avoid. Contact a dietitian for more information. Where to find more information  National Heart, Lung, and Blood Institute: PopSteam.is  American Heart Association: www.heart.org  Academy of Nutrition and Dietetics: www.eatright.org  National Kidney Foundation: www.kidney.org Summary  The DASH eating plan is a healthy eating plan that has been shown to reduce high blood pressure (hypertension). It may also reduce your risk for type 2 diabetes, heart disease, and stroke.  When on the DASH eating plan, aim to eat more fresh fruits and vegetables, whole grains, lean proteins, low-fat dairy, and heart-healthy fats.  With the DASH eating plan, you should limit salt (sodium) intake to 2,300 mg a day. If you have hypertension, you may need to reduce your sodium intake to 1,500 mg a day.  Work with your health care provider or dietitian to adjust your eating plan to your individual calorie needs. This information is not intended to replace advice given to you by your health care provider. Make sure you discuss any questions you have with your health care provider. Document Revised: 03/02/2019 Document Reviewed: 03/02/2019 Elsevier Patient Education  2021 [employer].

## 2020-06-24 NOTE — Progress Notes (Signed)
   SUBJECTIVE:   CHIEF COMPLAINT / HPI: DM f/u  DM-  Patient states that she has been following with alternate clinic since our last visit due to insurance changes but is now covered at our clinic again so is returning for follow up here. She endorses adherence with once daily metformin, glipzide, and jardiance 25. Denies polyuria, polydipsia. Trying to exercise and eat better.   Health maintenance- due for mammogram  OBJECTIVE:   BP 120/79   Pulse 85   Ht 5\' 1"  (1.549 m)   Wt 153 lb (69.4 kg)   SpO2 100%   BMI 28.91 kg/m   Physical Exam Vitals and nursing note reviewed.  Constitutional:      General: She is not in acute distress.    Appearance: Normal appearance. She is normal weight. She is not ill-appearing or toxic-appearing.  Eyes:     Conjunctiva/sclera: Conjunctivae normal.  Cardiovascular:     Rate and Rhythm: Normal rate and regular rhythm.     Pulses: Normal pulses.  Pulmonary:     Effort: Pulmonary effort is normal.  Musculoskeletal:     Right lower leg: No edema.     Left lower leg: No edema.  Skin:    General: Skin is warm.  Neurological:     General: No focal deficit present.     Mental Status: She is alert.  Psychiatric:        Mood and Affect: Mood normal.    ASSESSMENT/PLAN:   T2DM (type 2 diabetes mellitus) (HCC) Re-establishing DM management with our office.  hgb A1c is unchanged from previous >1 year ago. 8.7 today Hyperglycemic on BMP. - discontinue glipizide.  - switch back to farxiga given insurance coverage change - increase metofrmin 1,000mg  BID - recommended adding additional agent, but patient would like to wait until recheck in 3 months prior to starting any new medications - she has been tolerating statin well. Lipid panel repeat today and increase dose to 40mg  daily  Healthcare maintenance Mammogram ordered.  Instructions and map for scheduling given   , DO [facility]

## 2020-06-25 ENCOUNTER — Ambulatory Visit
Admission: RE | Admit: 2020-06-25 | Discharge: 2020-06-25 | Disposition: A | Discharge status: Home / Self Care | Payer: 59 | Source: Ambulatory Visit | Attending: Family Medicine | Admitting: Family Medicine

## 2020-06-25 DIAGNOSIS — Z1231 Encounter for screening mammogram for malignant neoplasm of breast: Secondary | ICD-10-CM

## 2020-06-25 LAB — BASIC METABOLIC PANEL
BUN/Creatinine Ratio: 27 — ABNORMAL HIGH (ref 9–23)
BUN: 15 mg/dL (ref 6–24)
CO2: 19 mmol/L — ABNORMAL LOW (ref 20–29)
Calcium: 8.9 mg/dL (ref 8.7–10.2)
Chloride: 102 mmol/L (ref 96–106)
Creatinine, Ser: 0.56 mg/dL — ABNORMAL LOW (ref 0.57–1.00)
Glucose: 137 mg/dL — ABNORMAL HIGH (ref 65–99)
Potassium: 4 mmol/L (ref 3.5–5.2)
Sodium: 139 mmol/L (ref 134–144)
eGFR: 117 mL/min/{1.73_m2} (ref >59)

## 2020-06-25 LAB — LIPID PANEL
Chol/HDL Ratio: 2.9 ratio (ref 0.0–4.4)
Cholesterol, Total: 158 mg/dL (ref 100–199)
HDL: 54 mg/dL (ref >39)
LDL Chol Calc (NIH): 82 mg/dL (ref 0–99)
Triglycerides: 126 mg/dL (ref 0–149)
VLDL Cholesterol Cal: 22 mg/dL (ref 5–40)

## 2020-06-25 LAB — HEPATITIS C ANTIBODY: Hep C Virus Ab: 0.2 s/co ratio (ref 0.0–0.9)

## 2020-06-25 NOTE — Assessment & Plan Note (Signed)
Mammogram ordered.  Instructions and map for scheduling given

## 2020-06-25 NOTE — Assessment & Plan Note (Signed)
Re-establishing DM management with our office.  hgb A1c is unchanged from previous >1 year ago. 8.7 today Hyperglycemic on BMP. - discontinue glipizide.  - switch back to farxiga given insurance coverage change - increase metofrmin 1,000mg  BID - recommended adding additional agent, but patient would like to wait until recheck in 3 months prior to starting any new medications - she has been tolerating statin well. Lipid panel repeat today and increase dose to 40mg  daily

## 2020-11-17 ENCOUNTER — Other Ambulatory Visit: Payer: Self-pay | Admitting: Family Medicine

## 2020-11-17 ENCOUNTER — Telehealth: Payer: Self-pay

## 2020-11-17 MED ORDER — DAPAGLIFLOZIN PROPANEDIOL 10 MG PO TABS: 10.0000 mg | ORAL_TABLET | Freq: Every day | ORAL | 1 refills | Status: DC

## 2020-11-17 MED ORDER — METFORMIN HCL 1000 MG PO TABS: 1000.0000 mg | ORAL_TABLET | Freq: Two times a day (BID) | ORAL | 3 refills | Status: DC

## 2020-11-17 NOTE — Progress Notes (Signed)
Patient requested refill on DM medications. Per chart review, appears she is on Metformin 1000 mg BID and Farxiga 10 mg daily. Refills sent. Due for repeat A1c check. Will have office call patient to schedule appointment.

## 2020-11-17 NOTE — Telephone Encounter (Signed)
Pt came in this morning stating she has run out of her diabetes meds, hasn't had them in over a week. She was out of town, requesting a refill sent to her pharmacy

## 2020-11-17 NOTE — Telephone Encounter (Signed)
VM for patient to schedule a visit with PCP.  Glennie Hawk, CMA

## 2020-11-24 ENCOUNTER — Other Ambulatory Visit: Payer: Self-pay

## 2020-11-24 ENCOUNTER — Ambulatory Visit (INDEPENDENT_AMBULATORY_CARE_PROVIDER_SITE_OTHER): Payer: 59

## 2020-11-24 ENCOUNTER — Ambulatory Visit (INDEPENDENT_AMBULATORY_CARE_PROVIDER_SITE_OTHER): Payer: 59 | Admitting: Family Medicine

## 2020-11-24 VITALS — BP 120/85 | HR 82 | Ht 61.0 in | Wt 151.6 lb

## 2020-11-24 DIAGNOSIS — E782 Mixed hyperlipidemia: Secondary | ICD-10-CM

## 2020-11-24 DIAGNOSIS — E119 Type 2 diabetes mellitus without complications: Secondary | ICD-10-CM | POA: Diagnosis not present

## 2020-11-24 DIAGNOSIS — Z23 Encounter for immunization: Secondary | ICD-10-CM | POA: Diagnosis not present

## 2020-11-24 DIAGNOSIS — I10 Essential (primary) hypertension: Secondary | ICD-10-CM | POA: Diagnosis not present

## 2020-11-24 LAB — POCT GLYCOSYLATED HEMOGLOBIN (HGB A1C): HbA1c, POC (controlled diabetic range): 9 % — AB (ref 0.0–7.0)

## 2020-11-24 MED ORDER — ENALAPRIL MALEATE 2.5 MG PO TABS: ORAL_TABLET | ORAL | 3 refills | Status: DC

## 2020-11-24 MED ORDER — ATORVASTATIN CALCIUM 40 MG PO TABS: 20.0000 mg | ORAL_TABLET | Freq: Every day | ORAL | 1 refills | Status: DC

## 2020-11-24 MED ORDER — SEMAGLUTIDE(0.25 OR 0.5MG/DOS) 2 MG/1.5ML ~~LOC~~ SOPN: 0.2500 mg | PEN_INJECTOR | SUBCUTANEOUS | 0 refills | Status: DC

## 2020-11-24 NOTE — Patient Instructions (Addendum)
It was nice seeing you today!  Try to cut back on carbohydrates such as rice and bread. Flavored coffees such as vanilla coffee have a lot of sugar, try to cut back if you can.  Please arrive at least 15 minutes prior to your scheduled appointments.  Stay well, Littie Deeds, MD [facility] [phone]

## 2020-11-24 NOTE — Assessment & Plan Note (Addendum)
A1c slightly increased (9.0 today) compared to 6 months ago despite being on 3 agents.  Glipizide was discontinued; however, patient had still been taking it. - d/c glipizide - start semaglutide 0.25 mg weekly, increase to 0.5 mg weekly after 4 weeks if tolerated - continue metformin, dapagliflozin - consider pharmacy clinic visit if medication cost is an issue - dietary counseling provided - next A1c 3 months - consider initiation of insulin if still poorly controlled

## 2020-11-24 NOTE — Progress Notes (Signed)
    SUBJECTIVE:   CHIEF COMPLAINT / HPI:   T2DM On metformin 1000 mg BID, dapagliflozin 10 mg Glipizide 10 mg was discontinued at last visit; however, patient states he had still been taking it but ran out 2 weeks ago Reports good compliance with medications Denies numbness or tingling Ran out of atorvastatin 2 weeks ago, needs refill Open to injectable medication, has given herself injections in the past Patient is concerned about the cost of dapagliflozin, still has to pay $250 even though it is covered by insurance  HTN On enalapril 2.5 mg daily Does not check BP at home  PERTINENT  PMH / PSH: T2DM, HTN, HLD  OBJECTIVE:   BP 120/85   Pulse 82   Ht 5\' 1"  (1.549 m)   Wt 151 lb 9.6 oz (68.8 kg)   LMP 11/22/2020   SpO2 99%   BMI 28.64 kg/m   General: middle-aged woman, NAD CV: RRR, no murmurs Pulm: CTAB, no wheezes or rales  Wt Readings from Last 3 Encounters:  11/24/20 151 lb 9.6 oz (68.8 kg)  06/24/20 153 lb (69.4 kg)  01/31/18 152 lb (68.9 kg)    Diabetic foot exam was performed.  No deformities or other abnormal visual findings.  Posterior tibialis and dorsalis pulse intact bilaterally.  Intact to touch and monofilament testing bilaterally.    Lab Results  Component Value Date   HGBA1C 9.0 (A) 11/24/2020     ASSESSMENT/PLAN:   T2DM (type 2 diabetes mellitus) (HCC) A1c slightly increased (9.0 today) compared to 6 months ago despite being on 3 agents.  Glipizide was discontinued; however, patient had still been taking it. - d/c glipizide - start semaglutide 0.25 mg weekly, increase to 0.5 mg weekly after 4 weeks if tolerated - continue metformin, dapagliflozin - consider pharmacy clinic visit if medication cost is an issue - dietary counseling provided - next A1c 3 months - consider initiation of insulin if still poorly controlled   HCM - diabetic foot exam completed today - diabetic eye exam, last completed in December at [facility] - reported  normal; records request sent - Covid vaccine booster given today   [facility], MD [facility]

## 2021-01-28 ENCOUNTER — Other Ambulatory Visit: Payer: Self-pay | Admitting: Family Medicine

## 2021-04-02 NOTE — Patient Instructions (Signed)
It was wonderful to see you today.  Please bring ALL of your medications with you to every visit.   Today we talked about:  -I will call you when I have your A1c result back. -I provided you with some Ozempic samples. I will touch base with our pharmacy team to see if they can help moving forward.  -You received your flu and pneumonia vaccines today.  -I prescribed a steroid cream that you can use on your finger. Use it as needed, do not use for prolonged period of time as it can thin your skin with long-term use.  Diet Recommendations for Diabetes  Carbohydrate includes starch, sugar, and fiber.  Of these, only sugar and starch raise blood glucose.  (Fiber is found in fruits, vegetables [especially skin, seeds, and stalks], whole grains, and beans.)   Starchy (carb) foods: Bread, rice, pasta, potatoes, corn, cereal, grits, crackers, bagels, muffins, all baked goods.  (Fruit, milk, and yogurt also have carbohydrate, but most of these foods will not spike your blood sugar as most starchy or sweet foods will.)  A few fruits do cause high blood sugars; use small portions of bananas (limit to 1/2 at a time), grapes, watermelon, and oranges.   Protein foods: Meat, fish, poultry, eggs, dairy foods, and beans such as pinto and kidney beans (beans also provide carbohydrate).   1. Eat at least 3 REAL meals and 1-2 snacks per day. Eat breakfast within the first hour of getting up.  Have something to eat at least every 5 hours while awake.   2. Limit starchy foods to TWO per meal and ONE per snack. ONE portion of a starchy food is equal to the following:   - ONE slice of bread (or its equivalent, such as half of a hamburger bun).   - 1/2 cup of a "scoopable" starchy food such as potatoes or rice.   - 15 grams of Total Carbohydrate as shown on food label.   - Every 4 ounces of a sweet drink (including fruit juice). 3. Include twice the volume of vegetables as protein or carbohydrate foods for BOTH lunch  and dinner as often as you can.   - Fresh or frozen vegetables are best.   - Keep frozen vegetables on hand for a quick option.          Thank you for choosing [facility].   Please call [phone] with any questions about today's appointment.  Please be sure to schedule follow up at the front  desk before you leave today.   Deanna Dick, DO PGY-2 Family Medicine

## 2021-04-02 NOTE — Progress Notes (Signed)
SUBJECTIVE:   CHIEF COMPLAINT / HPI:   Diabetes, Type 2 - Last A1c 9 in August  - Medications: Metformin 1000 mg BID, Farxiga 10 mg. Was only given sample of Ozempic, she really liked it but states she cannot afford it. Reports it costs $300.  - Compliance: Good - Checking BG at home: Only 2-3 days a week, "when I feel tired"  - Diet: Eats lots of vegetables. Still eats some white bread but moderation - Exercise: Walks typically but its been too cold.  - Eye exam: Checked in November. States that everything was fine. - Foot exam: Up to date - On Atorvastatin and Enalipril  - Denies symptoms of hypoglycemia, polyuria, polydipsia, numbness extremities, foot ulcers/trauma  Peeling Skin to Fingertip Patient works at a [employer].  She is right-hand dominant.  Her index finger has been peeling and she is wondering if there is medication for this.  She uses a Band-Aid and has used fingertip protectors in the past.  States that her husband thinks it is fungal.   PERTINENT  PMH / PSH:  Past Medical History:  Diagnosis Date   DM type 2 (diabetes mellitus, type 2) (HCC) 07/13/2011   Gestational diabetes    With first pregnancy, glyburide   HTN complicating peripregnancy, antepartum, third trimester 10/11/2016   Obesity (BMI 30-39.9)    Pregnancy induced hypertension    Uterine scar from previous cesarean delivery affecting pregnancy 10/11/2016   OBJECTIVE:   BP 107/70    Pulse (!) 107    Ht 5\' 1"  (1.549 m)    Wt 141 lb (64 kg)    SpO2 99%    BMI 26.64 kg/m    General: NAD, pleasant, able to participate in exam Cardiac: RRR, no murmurs. Respiratory: CTAB, normal effort, No wheezes, rales or rhonchi Extremities: no edema or cyanosis. Foot exam: No deformities, ulcerations, or other skin breakdown on feet bilaterally. There was dry skin to the heels bilaterally but no thick calluses. Sensation intact to monofilament and light touch.  PT and DP pulses intact BL.  Skin: warm and dry, no  rashes noted. There is dry and cracked skin to patients right index finger tip- all other fingers are smooth. No edema to LE. Psych: Normal affect and mood     ASSESSMENT/PLAN:   T2DM (type 2 diabetes mellitus) (HCC) Send-out A1c today given POC is out of service at this time. She is up to date on eye examination. Foot exam performed today and normal. On statin and ACE-I.  -Continue metformin 1000 mg twice daily -Continue Farxiga 10 mg daily -Provided with 2 boxes of Ozempic samples with instructions to take 0.25 mg weekly -Will send message to pharmacy team to see if patient qualifies for medication assistance program -Return in 3 or 6 months, pending A1c result -Will adjust medication regimen pending A1c   Update: A1c returned back at 10.6. Called patient to discuss results. Will start on Lantus 10U daily. Will send glucometer for her to check daily. Advised patient to keep log of sugars. Have scheduled her for an appointment with Dr. in 3 weeks to follow up and adjust as needed.  Peeling skin Suspect component of trauma/irritation from overuse given her work at a [employer]. Fingernail was normal. Don't believe this to be fungal.  -Trial Triamcinolone ointment to area -Continue to use finger protectors  -Moisturize area daily    Healthcare maintenance Influenza and Pneumococcal vaccines provided today without complication.  Sharion Settler, [place]

## 2021-04-07 ENCOUNTER — Ambulatory Visit (INDEPENDENT_AMBULATORY_CARE_PROVIDER_SITE_OTHER): Payer: 59 | Admitting: Family Medicine

## 2021-04-07 ENCOUNTER — Other Ambulatory Visit: Payer: Self-pay | Admitting: Family Medicine

## 2021-04-07 ENCOUNTER — Encounter: Payer: Self-pay | Admitting: Family Medicine

## 2021-04-07 ENCOUNTER — Other Ambulatory Visit: Payer: Self-pay

## 2021-04-07 VITALS — BP 107/70 | HR 107 | Ht 61.0 in | Wt 141.0 lb

## 2021-04-07 DIAGNOSIS — R234 Changes in skin texture: Secondary | ICD-10-CM | POA: Diagnosis not present

## 2021-04-07 DIAGNOSIS — Z0000 Encounter for general adult medical examination without abnormal findings: Secondary | ICD-10-CM

## 2021-04-07 DIAGNOSIS — Z23 Encounter for immunization: Secondary | ICD-10-CM | POA: Diagnosis not present

## 2021-04-07 DIAGNOSIS — E119 Type 2 diabetes mellitus without complications: Secondary | ICD-10-CM

## 2021-04-07 LAB — POCT GLYCOSYLATED HEMOGLOBIN (HGB A1C): HbA1c, POC (controlled diabetic range): 10.6 % — AB (ref 0.0–7.0)

## 2021-04-07 MED ORDER — NOVOFINE PEN NEEDLE 32G X 6 MM MISC: 1.0000 | Freq: Every day | 1 refills | Status: DC

## 2021-04-07 MED ORDER — TRIAMCINOLONE ACETONIDE 0.025 % EX OINT: 1.0000 "application " | TOPICAL_OINTMENT | Freq: Two times a day (BID) | CUTANEOUS | 0 refills | Status: DC

## 2021-04-07 MED ORDER — BLOOD GLUCOSE MONITOR KIT: PACK | 0 refills | Status: AC

## 2021-04-07 MED ORDER — OZEMPIC (0.25 OR 0.5 MG/DOSE) 2 MG/1.5ML ~~LOC~~ SOPN: 0.5000 mg | PEN_INJECTOR | SUBCUTANEOUS | 1 refills | Status: DC

## 2021-04-07 MED ORDER — INSULIN GLARGINE 100 UNIT/ML SOLOSTAR PEN: 10.0000 [IU] | PEN_INJECTOR | SUBCUTANEOUS | 11 refills | Status: DC

## 2021-04-07 NOTE — Assessment & Plan Note (Addendum)
Send-out A1c today given POC is out of service at this time. She is up to date on eye examination. Foot exam performed today and normal. On statin and ACE-I.  -Continue metformin 1000 mg twice daily -Continue Farxiga 10 mg daily -Provided with 2 boxes of Ozempic samples with instructions to take 0.25 mg weekly -Will send message to pharmacy team to see if patient qualifies for medication assistance program -Return in 3 or 6 months, pending A1c result -Will adjust medication regimen pending A1c   Update: A1c returned back at 10.6. Called patient to discuss results. Will start on Lantus 10U daily. Will send glucometer for her to check daily. Advised patient to keep log of sugars. Have scheduled her for an appointment with Dr. [employer] in 3 weeks to follow up and adjust as needed.

## 2021-04-07 NOTE — Assessment & Plan Note (Signed)
Influenza and Pneumococcal vaccines provided today without complication.

## 2021-04-07 NOTE — Assessment & Plan Note (Signed)
Suspect component of trauma/irritation from overuse given her work at a [employer]. Fingernail was normal. Don't believe this to be fungal.  -Trial Triamcinolone ointment to area -Continue to use finger protectors  -Moisturize area daily

## 2021-04-08 ENCOUNTER — Other Ambulatory Visit (HOSPITAL_COMMUNITY): Payer: Self-pay

## 2021-04-08 ENCOUNTER — Other Ambulatory Visit: Payer: Self-pay | Admitting: Family Medicine

## 2021-04-08 MED ORDER — INSULIN PEN NEEDLE 32G X 6 MM MISC: 1.0000 | Freq: Two times a day (BID) | 1 refills | Status: DC

## 2021-04-23 ENCOUNTER — Telehealth: Payer: Self-pay

## 2021-04-23 NOTE — Telephone Encounter (Signed)
Left vm requesting call back. Wanting to discuss patient assistance.

## 2021-04-29 ENCOUNTER — Other Ambulatory Visit: Payer: Self-pay

## 2021-04-29 ENCOUNTER — Ambulatory Visit (INDEPENDENT_AMBULATORY_CARE_PROVIDER_SITE_OTHER): Payer: Managed Care, Other (non HMO) | Admitting: Pharmacist

## 2021-04-29 DIAGNOSIS — E119 Type 2 diabetes mellitus without complications: Secondary | ICD-10-CM

## 2021-04-29 NOTE — Assessment & Plan Note (Signed)
T2DM is not controlled likely due to sub-optimal lifestyle. Medication adherence appears optimal. Will increase patient's Ozempic to 0.5mg  once weekly after patient completes fourth dose of 0.25mg  this Sunday. Following instruction patient verbalized understanding of treatment plan.    1. Continued basal insulin Lantus 10 units twice a day  2. Increased dose of GLP-1 Ozempic to 0.5mg  once weekly starting on 1/29  3. Continued SGLT2-I Farxiga 10mg  once daily 4. Continued metformin 1000mg  twice daily 5. Extensively discussed pathophysiology of diabetes, dietary effects on blood sugar control, and recommended lifestyle interventions,  6. Counseled on s/sx of and management of hypoglycemia 7. Next A1C anticipated March 2023.   Follow-up appointment one month to review sugar readings.

## 2021-04-29 NOTE — Patient Instructions (Signed)
Miss Kukla it was a pleasure seeing you today.   Please do the following:  This Sunday keep taking Ozempic 0.25mg  and then the following Sunday (1/29) dial the pen all the way to 0.5mg  and take that dose once a week Continue checking blood sugars at home. It's really important that you record these and bring these in to your next doctor's appointment.  Continue making the lifestyle changes we've discussed together during our visit. Diet and exercise play a significant role in improving your blood sugars.  Follow-up with me in one month.    Hypoglycemia or low blood sugar:   Low blood sugar can happen quickly and may become an emergency if not treated right away.   While this shouldn't happen often, it can be brought upon if you skip a meal or do not eat enough. Also, if your insulin or other diabetes medications are dosed too high, this can cause your blood sugar to go to low.   Warning signs of low blood sugar include: Feeling shaky or dizzy Feeling weak or tired  Excessive hunger Feeling anxious or upset  Sweating even when you aren't exercising  What to do if I experience low blood sugar? Follow the Rule of 15 Check your blood sugar with your meter. If lower than 70, proceed to step 2.  Treat with 15 grams of fast acting carbs which is found in 3-4 glucose tablets. If none are available you can try hard candy, 1 tablespoon of sugar or honey,4 ounces of fruit juice, or 6 ounces of REGULAR soda.  Re-check your sugar in 15 minutes. If it is still below 70, do what you did in step 2 again. If your blood sugar has come back up, go ahead and eat a snack or small meal made up of complex carbs (ex. Whole grains) and protein at this time to avoid recurrence of low blood sugar.

## 2021-04-29 NOTE — Progress Notes (Signed)
Subjective:    Patient ID: [name], female    DOB: 09-11-1975, 46 y.o.   MRN: [id number]  HPI Patient is a 46 y.o. female who presents for diabetes management. She is in good spirits and presents without assistance. Patient was referred and last seen by Primary Care Provider on 04/07/21.  Patient reports diabetes was diagnosed in 2011.   Insurance coverage/medication affordability: [employer]  Current diabetes medications include: Ozempic 0.25mg  once weekly on Sundays (completed 3 doses), Lantus 10 units twice daily (was prescribed to take once daily), metformin 1000mg  twice daily, Farxiga 10mg  once daily Current hypertension medications include: enalapril 2.5mg  once daily Current hyperlipidemia medications include: atorvastatin 40mg  once daily Patient states that She is taking her medications as prescribed. Patient reports adherence with medications.   Do you feel that your medications are working for you?  "I do not know"  Have you been experiencing any side effects to the medications prescribed? no  Do you have any problems obtaining medications due to transportation or finances?  no     Patient reported dietary habits:  Eats 3 meals/day and 1 snacks/day Breakfast: rice + vegetables Lunch: same as breakfast  Dinner: same as breakfast Snacks: crackers Drinks:water   Patient-reported exercise habits: "It is too cold so I don't go walking"   Patient denies hypoglycemic events. Patient denies polyuria (increased urination).  Patient denies polyphagia (increased appetite).  Patient denies polydipsia (increased thirst).  Patient denies neuropathy (nerve pain). Patient denies visual changes. Patient reports self foot exams.  Objective:   Home blood glucose readings daily:  145, 132, 218 145, 142, 166 151, 171, 148 118, 116, 137 132, 150 139, 105, 119 121, 106, 150 107, 98, 135 111, 93, 214 111, 92, 187 112, 113, 117 116, 103, 172  Labs:   Physical  Exam Neurological:     Mental Status: She is alert and oriented to person, place, and time.    Review of Systems  Gastrointestinal:  Negative for nausea and vomiting.   Lab Results  Component Value Date   HGBA1C 10.6 (A) 04/07/2021   HGBA1C 9.0 (A) 11/24/2020   HGBA1C 8.7 (A) 06/24/2020   Lab Results  Component Value Date   MICRALBCREAT 1,305.5 (H) 03/18/2014    Lipid Panel     Component Value Date/Time   CHOL 158 06/24/2020 1130   TRIG 126 06/24/2020 1130   HDL 54 06/24/2020 1130   CHOLHDL 2.9 06/24/2020 1130   CHOLHDL 4.7 07/11/2015 0906   VLDL 44 (H) 07/11/2015 0906   LDLCALC 82 06/24/2020 1130    Clinical Atherosclerotic Cardiovascular Disease (ASCVD): No  The 10-year ASCVD risk score (Arnett DK, et al., 2019) is: 0.9%   Values used to calculate the score:     Age: 46 years     Sex: Female     Is Non-Hispanic African American: No     Diabetic: Yes     Tobacco smoker: No     Systolic Blood Pressure: 107 mmHg     Is BP treated: Yes     HDL Cholesterol: 54 mg/dL     Total Cholesterol: 158 mg/dL   Assessment/Plan:   06/26/2020 is not controlled likely due to sub-optimal lifestyle. Medication adherence appears optimal. Will increase patient's Ozempic to 0.5mg  once weekly after patient completes fourth dose of 0.25mg  this Sunday. Following instruction patient verbalized understanding of treatment plan.    Continued basal insulin Lantus 10 units twice a day  Increased dose of GLP-1 Ozempic to 0.5mg   once weekly starting on 1/29  Continued SGLT2-I Farxiga 10mg  once daily Continued metformin 1000mg  twice daily Extensively discussed pathophysiology of diabetes, dietary effects on blood sugar control, and recommended lifestyle interventions,  Counseled on s/sx of and management of hypoglycemia Next A1C anticipated March 2023.   Follow-up appointment one month to review sugar readings. Written patient instructions provided.  This appointment required 30 minutes of direct  patient care.  Thank you for involving pharmacy to assist in providing this patient's care.  Patient seen with , PharmD Candidate.

## 2021-05-27 ENCOUNTER — Other Ambulatory Visit: Payer: Self-pay | Admitting: Family Medicine

## 2021-05-27 ENCOUNTER — Other Ambulatory Visit: Payer: Self-pay

## 2021-05-27 ENCOUNTER — Ambulatory Visit: Payer: Managed Care, Other (non HMO) | Admitting: Pharmacist

## 2021-05-27 DIAGNOSIS — E119 Type 2 diabetes mellitus without complications: Secondary | ICD-10-CM

## 2021-05-27 DIAGNOSIS — Z139 Encounter for screening, unspecified: Secondary | ICD-10-CM

## 2021-05-27 MED ORDER — BASAGLAR KWIKPEN 100 UNIT/ML ~~LOC~~ SOPN: 8.0000 [IU] | PEN_INJECTOR | Freq: Two times a day (BID) | SUBCUTANEOUS | 0 refills | Status: DC

## 2021-05-27 MED ORDER — TRULICITY 3 MG/0.5ML ~~LOC~~ SOAJ: 3.0000 mg | SUBCUTANEOUS | 0 refills | Status: DC

## 2021-05-27 NOTE — Assessment & Plan Note (Signed)
T2DM is not controlled likely due to sub-optimal lifestyle but is improving based on medication adherence and adjustments to therapy. Patient will complete fourth dose of Ozempic this Sunday and then will switch patient to Trulicity 3mg  once weekly due to insurance coverage preference. Will also switch Lantus to Basaglar and decrease dose slightly from 10 units twice daily to 8 units twice daily based on patient reporting hypoglycemic symptoms in the 90's. Will work towards giving her body more time to adjust to decrease in blood glucose. Following instruction patient verbalized understanding of treatment plan.    1. Decreased dose of basal insulin to 8 units twice daily and switched from Lantus to Basaglar  2. Discontinued GLP-1 Ozempic 0.5mg  once weekly and switched to Trulicity 3mg  once weekly 3. Continued SGLT2-I Farxiga 10mg  once daily 4. Continued metformin 1000mg  twice daily 5. Extensively discussed pathophysiology of diabetes, dietary effects on blood sugar control, and recommended lifestyle interventions,  6. Counseled on s/sx of and management of hypoglycemia 7. Next A1C anticipated March 2023.

## 2021-05-27 NOTE — Patient Instructions (Addendum)
Miss Epping it was a pleasure seeing you today.   Please do the following:  This Sunday finish your last dose of Ozempic 0.5mg  once weekly and the following Sunday (2/26) you will start the new medication called Trulicity and you will take that once a week instead of Ozempic as directed today during your appointment. If you have any questions or if you believe something has occurred because of this change, call me or your doctor to let one of [place] know.  Your new insulin will be called Basaglar instead of Lantus and you will take 8 units twice a day instead of 10 units twice a day Continue checking blood sugars at home. It's really important that you record these and bring these in to your next doctor's appointment.  Continue making the lifestyle changes we've discussed together during our visit. Diet and exercise play a significant role in improving your blood sugars.  Follow-up with Dr. Melba Coon next month   Hypoglycemia or low blood sugar:   Low blood sugar can happen quickly and may become an emergency if not treated right away.   While this shouldn't happen often, it can be brought upon if you skip a meal or do not eat enough. Also, if your insulin or other diabetes medications are dosed too high, this can cause your blood sugar to go to low.   Warning signs of low blood sugar include: Feeling shaky or dizzy Feeling weak or tired  Excessive hunger Feeling anxious or upset  Sweating even when you aren't exercising  What to do if I experience low blood sugar? Follow the Rule of 15 Check your blood sugar with your meter. If lower than 70, proceed to step 2.  Treat with 15 grams of fast acting carbs which is found in 3-4 glucose tablets. If none are available you can try hard candy, 1 tablespoon of sugar or honey,4 ounces of fruit juice, or 6 ounces of REGULAR soda.  Re-check your sugar in 15 minutes. If it is still below 70, do what you did in step 2 again. If your blood sugar has come back  up, go ahead and eat a snack or small meal made up of complex carbs (ex. Whole grains) and protein at this time to avoid recurrence of low blood sugar.

## 2021-05-27 NOTE — Progress Notes (Signed)
Subjective:    Patient ID: [name], female    DOB: [birth year], 46 y.o.   MRN: [id number]  HPI Patient is a 46 y.o. female who presents for diabetes management. She is in good spirits and presents without assistance. Patient was referred and last seen by Primary Care Provider on 04/07/21. Last seen in pharmacy clinic on 04/29/21.  Patient reports diabetes was diagnosed in 2011.   Insurance coverage/medication affordability: [employer]  Current diabetes medications include: Ozempic 0.5mg  once weekly on Sundays (completed 3 doses), Lantus 10 units twice daily (was prescribed to take once daily), metformin 1000mg  twice daily, Farxiga 10mg  once daily Current hypertension medications include: enalapril 2.5mg  once daily Current hyperlipidemia medications include: atorvastatin 40mg  once daily Patient states that She is taking her medications as prescribed. Patient reports adherence with medications.   Do you feel that your medications are working for you?  yes  Have you been experiencing any side effects to the medications prescribed? no  Do you have any problems obtaining medications due to transportation or finances?  Yes; patient reports insurance does not cover Ozempic or Lantus and she had to pay out of pocket   Patient reported dietary habits:  Eats 3 meals/day and 1 snacks/day Breakfast: rice + vegetables Lunch: same as breakfast  Dinner: same as breakfast Snacks: crackers Drinks:water   Patient-reported exercise habits: starting to walk again a little bit now that weather is warming up   Patient reports hypoglycemic symptoms when her blood glucose dips into the 90's. Patient denies polyuria (increased urination).  Patient denies polyphagia (increased appetite).  Patient denies polydipsia (increased thirst).  Patient denies neuropathy (nerve pain). Patient denies visual changes. Patient reports self foot exams.  Objective:   Home blood glucose readings daily:  05/10/2021 141, 118,  148  05/11/2021 126, 118, 190  05/12/2021 142, 117, 158  05/13/2021 128, 95, 197  05/14/2021 137, 115, 133  05/15/2021 152, 130, 227  05/16/2021 147, 121, 131  05/17/2021 132, 116, 145  05/18/2021 149, 121, 164  05/19/2021 122, 95, 15  05/20/2021 122, 95, 189  05/21/2021 135, 102, 151  05/22/2021 128, 102, 145  05/23/2021 119, 96, 128  05/24/2021 140, 111, 151  05/25/2021 150, 126, 179    Labs:   Physical Exam Neurological:     Mental Status: She is alert and oriented to person, place, and time.    Review of Systems  Gastrointestinal:  Negative for nausea and vomiting.   Lab Results  Component Value Date   HGBA1C 10.6 (A) 04/07/2021   HGBA1C 9.0 (A) 11/24/2020   HGBA1C 8.7 (A) 06/24/2020   Lab Results  Component Value Date   MICRALBCREAT 1,305.5 (H) 03/18/2014    Lipid Panel     Component Value Date/Time   CHOL 158 06/24/2020 1130   TRIG 126 06/24/2020 1130   HDL 54 06/24/2020 1130   CHOLHDL 2.9 06/24/2020 1130   CHOLHDL 4.7 07/11/2015 0906   VLDL 44 (H) 07/11/2015 0906   LDLCALC 82 06/24/2020 1130    Clinical Atherosclerotic Cardiovascular Disease (ASCVD): No  The 10-year ASCVD risk score (Arnett DK, et al., 2019) is: 0.9%   Values used to calculate the score:     Age: 35 years     Sex: Female     Is Non-Hispanic African American: No     Diabetic: Yes     Tobacco smoker: No     Systolic Blood Pressure: [id number] mmHg     Is BP treated: Yes  HDL Cholesterol: 54 mg/dL     Total Cholesterol: 158 mg/dL   Assessment/Plan:   T2DM is not controlled likely due to sub-optimal lifestyle but is improving based on medication adherence and adjustments to therapy. Patient will complete fourth dose of Ozempic this Sunday and then will switch patient to Trulicity 3mg  once weekly due to insurance coverage preference. Will also switch Lantus to Basaglar and decrease dose slightly from 10 units twice daily to 8 units twice daily based on patient reporting hypoglycemic symptoms in the 90's. Will  work towards giving her body more time to adjust to decrease in blood glucose. Following instruction patient verbalized understanding of treatment plan.    Decreased dose of basal insulin to 8 units twice daily and switched from Lantus to [place]  Discontinued GLP-1 Ozempic 0.5mg  once weekly and switched to Trulicity 3mg  once weekly Continued SGLT2-I Farxiga 10mg  once daily Continued metformin 1000mg  twice daily Extensively discussed pathophysiology of diabetes, dietary effects on blood sugar control, and recommended lifestyle interventions,  Counseled on s/sx of and management of hypoglycemia Next A1C anticipated March 2023.   Follow-up appointment one month to review sugar readings and titrate Trulicity to 4.5mg  once weekly if no adverse events. Written patient instructions provided.  This appointment required 30 minutes of direct patient care.  Thank you for involving pharmacy to assist in providing this patient's care.

## 2021-06-22 NOTE — Patient Instructions (Incomplete)
It was wonderful to see you today.  Please bring ALL of your medications with you to every visit.   Today we talked about:  -Your A1c today was *** which is *** compared to last time when it was 10.6%. The goal is to get this under 7.  -Medication changes: ***  -Continue your efforts to exercise more and improve your diet. See additional diet recommendations below. -Follow up with me in 3 months for your next diabetes check.   Diet Recommendations for Diabetes  Carbohydrate includes starch, sugar, and fiber.  Of these, only sugar and starch raise blood glucose.  (Fiber is found in fruits, vegetables [especially skin, seeds, and stalks], whole grains, and beans.)   Starchy (carb) foods: Bread, rice, pasta, potatoes, corn, cereal, grits, crackers, bagels, muffins, all baked goods.  (Fruit, milk, and yogurt also have carbohydrate, but most of these foods will not spike your blood sugar as most starchy or sweet foods will.)  A few fruits do cause high blood sugars; use small portions of bananas (limit to 1/2 at a time), grapes, watermelon, and oranges.   Protein foods: Meat, fish, poultry, eggs, dairy foods, and beans such as pinto and kidney beans (beans also provide carbohydrate).   1. Eat at least 3 REAL meals and 1-2 snacks per day. Eat breakfast within the first hour of getting up.  Have something to eat at least every 5 hours while awake.   2. Limit starchy foods to TWO per meal and ONE per snack. ONE portion of a starchy food is equal to the following:   - ONE slice of bread (or its equivalent, such as half of a hamburger bun).   - 1/2 cup of a "scoopable" starchy food such as potatoes or rice.   - 15 grams of Total Carbohydrate as shown on food label.   - Every 4 ounces of a sweet drink (including fruit juice). 3. Include twice the volume of vegetables as protein or carbohydrate foods for BOTH lunch and dinner as often as you can.   - Fresh or frozen vegetables are best.   - Keep frozen  vegetables on hand for a quick option.          Thank you for choosing [facility].   Please call [phone] with any questions about today's appointment.  Please be sure to schedule follow up at the front  desk before you leave today.   Sabino Dick, DO PGY-2 Family Medicine

## 2021-06-22 NOTE — Progress Notes (Addendum)
    SUBJECTIVE:   CHIEF COMPLAINT / HPI:   Diabetes, Type 2 - Last A1c 10.6% in December - Medications: Metformin 1000 mg BID, Farxiga 10 mg daily, Basaglar 8U BID, Trulicity 3 mg weekly - Compliance: Great compliance - Checking BG at home: 3 times daily, ranging 90-160s. - Diet: Trying to incorporate more vegetables. Doesn't like brown rice much. - Exercise: Will try to walk more when the weather is warmer. - Eye exam: States she had eye exam in November  - Foot exam: UTD - Microalbumin: Overdue - Statin: Atorvastatin 40 mg daily. Last lipid panel 06/2020. Will check lipid panel today. - Denies symptoms of hypoglycemia, polyuria, polydipsia, numbness extremities, foot ulcers/trauma  PERTINENT  PMH / PSH:  Past Medical History:  Diagnosis Date   DM type 2 (diabetes mellitus, type 2) (HCC) 07/13/2011   Gestational diabetes    With first pregnancy, glyburide   HTN complicating peripregnancy, antepartum, third trimester 10/11/2016   Obesity (BMI 30-39.9)    Pregnancy induced hypertension    Uterine scar from previous cesarean delivery affecting pregnancy 10/11/2016    OBJECTIVE:   BP 110/78   Pulse 67   Wt 142 lb (64.4 kg)   LMP 06/23/2021   BMI 26.83 kg/m    General: NAD, pleasant, able to participate in exam Respiratory: Breathing comfortably on room air Extremities: no edema or cyanosis. Skin: warm and dry Foot exam: No deformities, ulcerations, or other skin breakdown on feet bilaterally.  PT and DP pulses intact BL. No edema  foot without evidence of ulcers, warm  Psych: Normal affect and mood  ASSESSMENT/PLAN:   T2DM (type 2 diabetes mellitus) (HCC) Much improved today, A1c dropped from 10.6 to 7.6%.  Patient has great compliance with medications and has been without any symptoms of hypoglycemia since the decrease in her insulin from 10 units to 8 units twice daily.  Still has room to increase exercise.  I suspect that we will be able to get her off insulin at some  point soon.  -Decrease insulin to 8 units once daily -Continue Trulicity 3 mg weekly -Continue metformin 1000 mg twice daily -Continue Farxiga 10 mg daily -Continue high intensity statin, ACE inhibitor -Urine microalbumin today -Recheck lipid panel today -Follow-up in 3 months for repeat A1c -Up-to-date on eye exam and diabetic foot exam  Hyperlipemia Last lipid panel on 06/2020 with LDL 82, triglycerides 169, total cholesterol 158. Currently on atorvastatin 40 mg daily, without any adverse effects. -Continue statin -Recheck lipid panel today    Sabino Dick, DO [facility] [facility]

## 2021-06-26 ENCOUNTER — Other Ambulatory Visit: Payer: Self-pay

## 2021-06-26 ENCOUNTER — Ambulatory Visit: Payer: Managed Care, Other (non HMO) | Admitting: Family Medicine

## 2021-06-26 VITALS — BP 110/78 | HR 67 | Wt 142.0 lb

## 2021-06-26 DIAGNOSIS — R809 Proteinuria, unspecified: Secondary | ICD-10-CM | POA: Diagnosis not present

## 2021-06-26 DIAGNOSIS — E119 Type 2 diabetes mellitus without complications: Secondary | ICD-10-CM | POA: Diagnosis not present

## 2021-06-26 DIAGNOSIS — E782 Mixed hyperlipidemia: Secondary | ICD-10-CM

## 2021-06-26 LAB — POCT GLYCOSYLATED HEMOGLOBIN (HGB A1C): HbA1c, POC (controlled diabetic range): 7.6 % — AB (ref 0.0–7.0)

## 2021-06-26 MED ORDER — INSULIN PEN NEEDLE 32G X 6 MM MISC: 1.0000 | Freq: Two times a day (BID) | 1 refills | Status: DC

## 2021-06-26 MED ORDER — METFORMIN HCL 1000 MG PO TABS: 1000.0000 mg | ORAL_TABLET | Freq: Two times a day (BID) | ORAL | 3 refills | Status: DC

## 2021-06-26 MED ORDER — ATORVASTATIN CALCIUM 40 MG PO TABS: 20.0000 mg | ORAL_TABLET | Freq: Every day | ORAL | 1 refills | Status: DC

## 2021-06-26 MED ORDER — TRULICITY 3 MG/0.5ML ~~LOC~~ SOAJ: 3.0000 mg | SUBCUTANEOUS | 0 refills | Status: DC

## 2021-06-26 MED ORDER — DAPAGLIFLOZIN PROPANEDIOL 10 MG PO TABS: 10.0000 mg | ORAL_TABLET | Freq: Every day | ORAL | 1 refills | Status: DC

## 2021-06-26 MED ORDER — NOVOFINE PEN NEEDLE 32G X 6 MM MISC: 1.0000 | Freq: Every day | 1 refills | Status: DC

## 2021-06-26 MED ORDER — BASAGLAR KWIKPEN 100 UNIT/ML ~~LOC~~ SOPN: 8.0000 [IU] | PEN_INJECTOR | Freq: Two times a day (BID) | SUBCUTANEOUS | 0 refills | Status: DC

## 2021-06-26 MED ORDER — ENALAPRIL MALEATE 2.5 MG PO TABS: ORAL_TABLET | ORAL | 3 refills | Status: DC

## 2021-06-26 NOTE — Assessment & Plan Note (Signed)
Last lipid panel on 06/2020 with LDL 82, triglycerides 169, total cholesterol 158. Currently on atorvastatin 40 mg daily, without any adverse effects. ?-Continue statin ?-Recheck lipid panel today ?

## 2021-06-26 NOTE — Assessment & Plan Note (Signed)
Much improved today, A1c dropped from 10.6 to 7.6%.  Patient has great compliance with medications and has been without any symptoms of hypoglycemia since the decrease in her insulin from 10 units to 8 units twice daily.  Still has room to increase exercise.  I suspect that we will be able to get her off insulin at some point soon.  ?-Decrease insulin to 8 units once daily ?-Continue Trulicity 3 mg weekly ?-Continue metformin 1000 mg twice daily ?-Continue Farxiga 10 mg daily ?-Continue high intensity statin, ACE inhibitor ?-Urine microalbumin today ?-Recheck lipid panel today ?-Follow-up in 3 months for repeat A1c ?-Up-to-date on eye exam and diabetic foot exam ?

## 2021-06-27 LAB — LIPID PANEL
Chol/HDL Ratio: 3.4 ratio (ref 0.0–4.4)
Cholesterol, Total: 134 mg/dL (ref 100–199)
HDL: 39 mg/dL — ABNORMAL LOW (ref >39)
LDL Chol Calc (NIH): 73 mg/dL (ref 0–99)
Triglycerides: 124 mg/dL (ref 0–149)
VLDL Cholesterol Cal: 22 mg/dL (ref 5–40)

## 2021-06-27 LAB — MICROALBUMIN / CREATININE URINE RATIO
Creatinine, Urine: 57.4 mg/dL
Microalb/Creat Ratio: 855 mg/g creat — ABNORMAL HIGH (ref 0–29)
Microalbumin, Urine: 490.5 ug/mL

## 2021-07-02 ENCOUNTER — Ambulatory Visit
Admission: RE | Admit: 2021-07-02 | Discharge: 2021-07-02 | Disposition: A | Discharge status: Home / Self Care | Payer: Managed Care, Other (non HMO) | Source: Ambulatory Visit | Attending: Family Medicine | Admitting: Family Medicine

## 2021-07-02 DIAGNOSIS — Z139 Encounter for screening, unspecified: Secondary | ICD-10-CM

## 2021-08-10 ENCOUNTER — Other Ambulatory Visit: Payer: Self-pay | Admitting: Family Medicine

## 2021-08-12 ENCOUNTER — Other Ambulatory Visit (HOSPITAL_COMMUNITY): Payer: Self-pay

## 2021-08-18 ENCOUNTER — Other Ambulatory Visit (HOSPITAL_COMMUNITY): Payer: Self-pay

## 2021-09-18 ENCOUNTER — Other Ambulatory Visit: Payer: Self-pay | Admitting: Family Medicine

## 2021-09-18 MED ORDER — TRULICITY 3 MG/0.5ML ~~LOC~~ SOAJ: SUBCUTANEOUS | 3 refills | Status: DC

## 2021-09-25 NOTE — Progress Notes (Signed)
    SUBJECTIVE:   CHIEF COMPLAINT / HPI:   Diabetes, Type 2 - Last A1c was 7.6 in March - Medications: Metformin 1000 mg BID, Farxiga 10 mg daily, Basaglar 8U BID (has been taking once a day), Trulicity 3 mg weekly - Compliance: Good, with exception of only taking Basaglar once a day - Checking BG at home: Checks every morning 150-175 in the AM - Diet: Still working towards improvements.  - Exercise: Walking - Eye exam: UTD - Foot exam: UTD - Microalbumin: UTD - Statin: Atorvastatin 40 mg  - Denies symptoms of hypoglycemia, polyuria, polydipsia, numbness extremities, foot ulcers/trauma  PERTINENT  PMH / PSH:  Past Medical History:  Diagnosis Date   DM type 2 (diabetes mellitus, type 2) (HCC) 07/13/2011   Gestational diabetes    With first pregnancy, glyburide   HTN complicating peripregnancy, antepartum, third trimester 10/11/2016   Obesity (BMI 30-39.9)    Pregnancy induced hypertension    Uterine scar from previous cesarean delivery affecting pregnancy 10/11/2016   OBJECTIVE:   BP 122/82   Pulse 100   Ht 5\' 1"  (1.549 m)   Wt 143 lb (64.9 kg)   LMP 09/22/2021   SpO2 99%   BMI 27.02 kg/m    General: NAD, pleasant, able to participate in exam Respiratory: Normal respiratory effort Foot exam: No deformities, ulcerations, or other skin breakdown on feet bilaterally.  Sensation intact to monofilament and light touch.  PT and DP pulses intact BL.   Skin: warm and dry, no rashes noted Psych: Normal affect and mood  ASSESSMENT/PLAN:   T2DM (type 2 diabetes mellitus) (HCC) Hemoglobin A1c slightly worsened today at 8.2, from 7.6 in March.  Maxed out on metformin and [place].  Have room to go up on Trulicity as well as insulin. Will increase to 10 U basiglar with instructions to increase titration if fasting sugars are >130 for 3 days in a row. Teach back method utilized.  -Increase Basaglar to 10 units, increase by 2 units for fasting sugars greater than 130 3 days in a  row -Continue metformin 1000 mg twice daily -Continue Farxiga 10 mg daily -Continue Trulicity 3 mg weekly -Continue statin and ACE inhibitor -F/u in 3 months  HTN (hypertension) Well controlled today. Continue current regimen.      Sabino Dick, DO [facility]

## 2021-09-29 ENCOUNTER — Ambulatory Visit (INDEPENDENT_AMBULATORY_CARE_PROVIDER_SITE_OTHER): Payer: Commercial Managed Care - HMO | Admitting: Family Medicine

## 2021-09-29 ENCOUNTER — Encounter: Payer: Self-pay | Admitting: Family Medicine

## 2021-09-29 VITALS — BP 122/82 | HR 100 | Ht 61.0 in | Wt 143.0 lb

## 2021-09-29 DIAGNOSIS — E1165 Type 2 diabetes mellitus with hyperglycemia: Secondary | ICD-10-CM | POA: Diagnosis not present

## 2021-09-29 DIAGNOSIS — E119 Type 2 diabetes mellitus without complications: Secondary | ICD-10-CM | POA: Diagnosis not present

## 2021-09-29 DIAGNOSIS — I10 Essential (primary) hypertension: Secondary | ICD-10-CM

## 2021-09-29 DIAGNOSIS — Z794 Long term (current) use of insulin: Secondary | ICD-10-CM | POA: Diagnosis not present

## 2021-09-29 LAB — POCT GLYCOSYLATED HEMOGLOBIN (HGB A1C): HbA1c, POC (controlled diabetic range): 8.2 % — AB (ref 0.0–7.0)

## 2021-09-29 MED ORDER — BASAGLAR KWIKPEN 100 UNIT/ML ~~LOC~~ SOPN
10.0000 [IU] | PEN_INJECTOR | Freq: Every day | SUBCUTANEOUS | 0 refills | Status: DC
Start: 2021-09-29 — End: 2022-05-06

## 2021-09-29 NOTE — Assessment & Plan Note (Signed)
Hemoglobin A1c slightly worsened today at 8.2, from 7.6 in March.  Maxed out on metformin and [place].  Have room to go up on Trulicity as well as insulin. Will increase to 10 U basiglar with instructions to increase titration if fasting sugars are >130 for 3 days in a row. Teach back method utilized.  -Increase Basaglar to 10 units, increase by 2 units for fasting sugars greater than 130 3 days in a row -Continue metformin 1000 mg twice daily -Continue Farxiga 10 mg daily -Continue Trulicity 3 mg weekly -Continue statin and ACE inhibitor -F/u in 3 months

## 2021-09-29 NOTE — Patient Instructions (Signed)
It was wonderful to see you today.  Please bring ALL of your medications with you to every visit.   Today we talked about:  -Your A1c was 8.2 today -I would like you to increase to 10 units of insulin.  -Check your morning fasting sugars, if they are higher than 130 for 3 days in a row, increase your insulin by 2 units. Continue this trend until you have fasting sugars less than 130.   Thank you for choosing [facility].   Please call [phone] with any questions about today's appointment.  Please be sure to schedule follow up at the front  desk before you leave today.   Sabino Dick, DO PGY-2 Family Medicine

## 2021-09-29 NOTE — Assessment & Plan Note (Signed)
Well controlled today. Continue current regimen. 

## 2021-12-18 ENCOUNTER — Other Ambulatory Visit: Payer: Self-pay | Admitting: Family Medicine

## 2021-12-22 NOTE — Progress Notes (Signed)
    SUBJECTIVE:   CHIEF COMPLAINT / HPI:   [name] is a 46 y.o. female who presents to the [facility] clinic today to discuss the following concerns:   Diabetes, Type 2 - Last A1c was 8.2 in June - Medications: Metformin 1000 mg BID, Farxiga 10 mg daily, Basaglar 12U. States that she stopped Trulicity about a month ago. She was having anxiety with self-injecting herself.  Feels like she did better with Ozempic. - Compliance: Good - Checking BG at home: Checks 2-3 times a day. Fasting sugars are 150-170 - Diet: Loves rice but also eats a lot of vegetables.  - Exercise: Walks about 25 minutes every day or every other day - Eye exam: Plans to get in November - Foot exam: Due - Microalbumin: Elevated in March at 855, on small does ACE-I - Statin: On Atorvastatin 40 mg  - Denies symptoms of hypoglycemia, polyuria, polydipsia, numbness extremities, foot ulcers/trauma  Hypertension F/U Current medications include Enalapril 2.5 mg. Reports good compliance. Intermittently checks blood pressure at home- they range 130s systolic and 60s diastolic. Denies chest pain, shortness of breath, lower extremity edema.   Heart Burn Notes that she gets intermittent episodes of heartburn.  Worse when she eats spicy foods but she does not often eat spicy foods.  She drinks coffee intermittently.  PERTINENT  PMH / PSH: HLD  OBJECTIVE:   BP 100/70   Pulse 84   Wt 145 lb 3.2 oz (65.9 kg)   LMP 11/22/2021   SpO2 97%   BMI 27.44 kg/m    General: NAD, pleasant, able to participate in exam Respiratory: Breathing comfortably on room air, no respiratory distress  Foot exam: No deformities, ulcerations, or other skin breakdown on feet bilaterally.  Sensation intact to monofilament and light touch.  PT and DP pulses intact BL.   Psych: Normal affect and mood  ASSESSMENT/PLAN:   T2DM (type 2 diabetes mellitus) (HCC) A1c is stable at 8.3, still above her goal.  We discussed increasing her insulin by 2 units daily  if her fasting sugars are above 130.  It is unfortunate that she stopped her Trulicity.  Offered to start her on Ozempic versus trial of Rybelsus which is an oral GLP-1.  She would prefer to trial oral medication for now. -Start Rybelsus 3 mg daily, increase dose in 30 days if tolerating well -Continue metformin, Farxiga -Continue Basaglar 12 units daily, increase by 2 units daily if fasting sugars greater than 130  -Continue statin and ACE inhibitor -Foot exam today unremarkable -Follow-up in 3 months for next A1c  HTN (hypertension) BP slightly soft today but asymptomatic. -Continue low-dose enalapril  Dyspepsia Intermittent abdominal symptoms seem most consistent with dyspepsia.  Will trial famotidine.     Sabino Dick, DO [place] [facility]

## 2021-12-22 NOTE — Patient Instructions (Signed)
It was wonderful to see you today.  Please bring ALL of your medications with you to every visit.   Today we talked about:  -Your A1c today was 8.3, still slightly above the goal is <7.  -I am starting you on a new medication called Rybelsus. Take one tablet once a day. If you are doing well with this, in 30 days we can increase the dose. -Remember: Do NOT take your Marcelline Deist if you are feeling ill and unable to eat or drink well.  -Check your fasting sugars in the morning, if they are more than 130, go up by 2 units on your insulin the following day.  For example: if your morning fasting sugar is 150. Instead of taking 12 units of Basaglar the following day you would take 14 units. If the next morning your sugar is still 150, you would go up to 16 units the next day. Continue this until your morning sugar is less than 130.  -Follow up in 3 months for your next A1c check.  Diet Recommendations for Diabetes  Carbohydrate includes starch, sugar, and fiber.  Of these, only sugar and starch raise blood glucose.  (Fiber is found in fruits, vegetables [especially skin, seeds, and stalks], whole grains, and beans.)   Starchy (carb) foods: Bread, rice, pasta, potatoes, corn, cereal, grits, crackers, bagels, muffins, all baked goods.  (Fruit, milk, and yogurt also have carbohydrate, but most of these foods will not spike your blood sugar as most starchy or sweet foods will.)  A few fruits do cause high blood sugars; use small portions of bananas (limit to 1/2 at a time), grapes, watermelon, and oranges.   Protein foods: Meat, fish, poultry, eggs, dairy foods, and beans such as pinto and kidney beans (beans also provide carbohydrate).   1. Eat at least 3 REAL meals and 1-3 snacks per day.  Eat breakfast within one hour of getting up.  Have something to eat at least every 5 hours while awake.  - A REAL meal for lunch or dinner includes at least some protein, some starch, and vegetables and/or fruit.   - A  REAL breakfast needs to include both starch and protein foods.     2. Limit starchy foods to TWO portions per meal and ONE per snack. ONE portion of a starchy food is equal to the following:  - ONE slice of bread (or its equivalent, such as half of a hamburger bun).  - 1/2 cup of a "scoopable" starchy food such as potatoes or rice.  - 15 grams of Total Carbohydrate as shown on food label.  - 4 ounces of a sweet drink (including fruit juice).  3. Include twice the volume of vegetables as protein or carbohydrate foods for BOTH lunch and dinner.  - Fresh or frozen vegetables are best.  - Keep frozen vegetables on hand for a quick option.          Thank you for choosing [facility].   Please call [phone] with any questions about today's appointment.  Please be sure to schedule follow up at the front  desk before you leave today.   Sabino Dick, DO PGY-3 Family Medicine

## 2021-12-23 ENCOUNTER — Encounter: Payer: Self-pay | Admitting: Family Medicine

## 2021-12-23 ENCOUNTER — Ambulatory Visit: Payer: Commercial Managed Care - HMO | Admitting: Family Medicine

## 2021-12-23 VITALS — BP 100/70 | HR 84 | Wt 145.2 lb

## 2021-12-23 DIAGNOSIS — I10 Essential (primary) hypertension: Secondary | ICD-10-CM

## 2021-12-23 DIAGNOSIS — E119 Type 2 diabetes mellitus without complications: Secondary | ICD-10-CM

## 2021-12-23 DIAGNOSIS — R1013 Epigastric pain: Secondary | ICD-10-CM | POA: Diagnosis not present

## 2021-12-23 LAB — POCT GLYCOSYLATED HEMOGLOBIN (HGB A1C): HbA1c, POC (controlled diabetic range): 8.3 % — AB (ref 0.0–7.0)

## 2021-12-23 MED ORDER — RYBELSUS 3 MG PO TABS
1.0000 | ORAL_TABLET | Freq: Every day | ORAL | 0 refills | Status: DC
Start: 2021-12-23 — End: 2022-05-06

## 2021-12-23 MED ORDER — DAPAGLIFLOZIN PROPANEDIOL 10 MG PO TABS
10.0000 mg | ORAL_TABLET | Freq: Every day | ORAL | 1 refills | Status: DC
Start: 2021-12-23 — End: 2022-07-20

## 2021-12-23 MED ORDER — FAMOTIDINE 20 MG PO TABS: 20.0000 mg | ORAL_TABLET | Freq: Two times a day (BID) | ORAL | 1 refills | Status: DC

## 2021-12-23 NOTE — Assessment & Plan Note (Signed)
Intermittent abdominal symptoms seem most consistent with dyspepsia.  Will trial famotidine.

## 2021-12-23 NOTE — Assessment & Plan Note (Signed)
BP slightly soft today but asymptomatic. -Continue low-dose enalapril

## 2021-12-23 NOTE — Assessment & Plan Note (Signed)
A1c is stable at 8.3, still above her goal.  We discussed increasing her insulin by 2 units daily if her fasting sugars are above 130.  It is unfortunate that she stopped her Trulicity.  Offered to start her on Ozempic versus trial of Rybelsus which is an oral GLP-1.  She would prefer to trial oral medication for now. -Start Rybelsus 3 mg daily, increase dose in 30 days if tolerating well -Continue metformin, Farxiga -Continue Basaglar 12 units daily, increase by 2 units daily if fasting sugars greater than 130  -Continue statin and ACE inhibitor -Foot exam today unremarkable -Follow-up in 3 months for next A1c

## 2022-03-01 ENCOUNTER — Other Ambulatory Visit: Payer: Self-pay | Admitting: Family Medicine

## 2022-03-02 ENCOUNTER — Other Ambulatory Visit: Payer: Self-pay | Admitting: Family Medicine

## 2022-05-06 ENCOUNTER — Ambulatory Visit: Payer: Commercial Managed Care - HMO | Admitting: Family Medicine

## 2022-05-06 ENCOUNTER — Encounter: Payer: Self-pay | Admitting: Family Medicine

## 2022-05-06 VITALS — BP 100/70 | HR 76 | Temp 98.4°F | Ht 61.0 in | Wt 140.6 lb

## 2022-05-06 DIAGNOSIS — Z794 Long term (current) use of insulin: Secondary | ICD-10-CM | POA: Diagnosis not present

## 2022-05-06 DIAGNOSIS — I10 Essential (primary) hypertension: Secondary | ICD-10-CM

## 2022-05-06 DIAGNOSIS — E119 Type 2 diabetes mellitus without complications: Secondary | ICD-10-CM | POA: Diagnosis not present

## 2022-05-06 LAB — POCT GLYCOSYLATED HEMOGLOBIN (HGB A1C): HbA1c, POC (controlled diabetic range): 9.7 % — AB (ref 0.0–7.0)

## 2022-05-06 MED ORDER — RYBELSUS 3 MG PO TABS: 1.0000 | ORAL_TABLET | Freq: Every day | ORAL | 3 refills | Status: DC

## 2022-05-06 MED ORDER — BASAGLAR KWIKPEN 100 UNIT/ML ~~LOC~~ SOPN: 18.0000 [IU] | PEN_INJECTOR | Freq: Every day | SUBCUTANEOUS | 0 refills | Status: DC

## 2022-05-06 NOTE — Progress Notes (Signed)
    SUBJECTIVE:   CHIEF COMPLAINT / HPI:   [name] is a 47 y.o. female who presents to the [facility] clinic today to discuss the following concerns:   Diabetes, Type 2 - Last A1c 8.3 in September - Medications: Basaglar 14U, Metformin 1000 mg BID, farxiga 10 mg - Compliance: Good - Checking BG at home: Checks every 2-3 days fasted. Range 180s-200s.  - Diet: Working on eating more vegetables  - Exercise: Not exercising due to the winter time, "too cold" - Eye exam: Due - Foot exam: UTD - Microalbumin: Due - Statin: Atorvastatin 40 mg  - Denies symptoms of hypoglycemia, polyuria, polydipsia, numbness extremities, foot ulcers/trauma  PERTINENT  PMH / PSH: HTN, HLD   OBJECTIVE:   BP 100/70   Pulse 76   Temp 98.4 F (36.9 C)   Ht 5\' 1"  (1.549 m)   Wt 140 lb 9.6 oz (63.8 kg)   LMP 05/06/2022 (Exact Date)   SpO2 99%   BMI 26.57 kg/m    General: NAD, pleasant, able to participate in exam Respiratory: normal effort Psych: Normal affect and mood  ASSESSMENT/PLAN:   1. Type 2 diabetes mellitus without complication, with long-term current use of insulin (HCC) Hgb A1c 9.7 (increased from 8.3) in September. -Increase insulin basaglar to 18U daily -Advised to check morning sugars daily min -F/u with Dr. Valentina Lucks in 3 weeks -F/u with me in 3 months for next A1c  -Continue statin, oral GLP-1 (did not tolerate Ozempic), metformin, ACE-I, SGLT-2 - Ambulatory referral to Ophthalmology - Microalbumin/Creatinine Ratio, Urine - Semaglutide (RYBELSUS) 3 MG TABS; Take 1 tablet (3 mg total) by mouth daily.  Dispense: 30 tablet; Refill: 3 - Insulin Glargine (BASAGLAR KWIKPEN) 100 UNIT/ML; Inject 18 Units into the skin daily.  Dispense: 15 mL; Refill: 0  2. Primary hypertension Normotensive. Well controlled on small dose of enalapril. -Continue enalapril    Sharion Settler, [place]

## 2022-05-06 NOTE — Patient Instructions (Addendum)
It was wonderful to see you today.  Please bring ALL of your medications with you to every visit.   Today we talked about:  Your A1c was elevated today.  I recommend that we increase your insulin to 18 units daily. Check your morning sugars every day and write them down. Bring your sugar log when you see Dr. Valentina Lucks in a few weeks Try to do some sort of indoor exercise! It is important and healthy to move your body I am sending a referral to an eye doctor for your diabetic eye examination We are doing a test today to check on your kidneys, I will let you know if this is abnormal. Follow up with me in 3 months for your next A1c check.   Thank you for coming to your visit as scheduled. We have had a large "no-show" problem lately, and this significantly limits our ability to see and care for patients. As a friendly reminder- if you cannot make your appointment please call to cancel. We do have a no show policy for those who do not cancel within 24 hours. Our policy is that if you miss or fail to cancel an appointment within 24 hours, 3 times in a 24-month period, you may be dismissed from our clinic.   Thank you for choosing [facility].   Please call [phone] with any questions about today's appointment.  Please be sure to schedule follow up at the front  desk before you leave today.   Sharion Settler, DO PGY-3 Family Medicine

## 2022-05-06 NOTE — Assessment & Plan Note (Signed)
Normotensive. Well controlled on small dose of enalapril. -Continue enalapril

## 2022-05-08 LAB — MICROALBUMIN / CREATININE URINE RATIO
Creatinine, Urine: 78.8 mg/dL
Microalb/Creat Ratio: 440 mg/g creat — ABNORMAL HIGH (ref 0–29)
Microalbumin, Urine: 347 ug/mL

## 2022-05-10 ENCOUNTER — Encounter: Payer: Self-pay | Admitting: Family Medicine

## 2022-05-24 ENCOUNTER — Ambulatory Visit: Payer: Commercial Managed Care - HMO | Admitting: Pharmacist

## 2022-05-24 ENCOUNTER — Encounter: Payer: Self-pay | Admitting: Pharmacist

## 2022-05-24 ENCOUNTER — Other Ambulatory Visit (HOSPITAL_COMMUNITY): Payer: Self-pay

## 2022-05-24 DIAGNOSIS — E119 Type 2 diabetes mellitus without complications: Secondary | ICD-10-CM

## 2022-05-24 MED ORDER — OZEMPIC (0.25 OR 0.5 MG/DOSE) 2 MG/3ML ~~LOC~~ SOPN: 0.2500 mg | PEN_INJECTOR | SUBCUTANEOUS | 6 refills | Status: DC

## 2022-05-24 NOTE — Progress Notes (Signed)
    S:     Chief Complaint  Patient presents with   Medication Management    Diabetes   47 y.o. female who presents for diabetes evaluation, education, and management.  PMH is significant for Hypertension and Hyperlipidemia.  Patient was referred and last seen by Primary Care Provider, Dr. Nita Sells, on 05/06/2022.   At last visit, patient was prescribed Rybelsus (oral semaglutide) which she is not able to afford.    Today, patient arrives in good spirits and presents without any assistance.   Patient reports Diabetes was diagnosed approximately 10 years ago.    Current diabetes medications include: Basaglar (insulin glargine) 18 units daily Current hypertension medications include: enalapril 2.5mg  daily Current hyperlipidemia medications include: atorvastatin 40mg  daily  Patient reports adherence to taking all medications as prescribed.    Patient denies hypoglycemic events.  Patient reported dietary habits: Eats multiple small meals/day Dinner: eats late meal in the evening - largest meal of the day.    O:   ROS  Physical Exam Constitutional:      Appearance: Normal appearance.  Neurological:     Mental Status: She is alert.  Psychiatric:        Mood and Affect: Mood normal.        Behavior: Behavior normal.     7 day average blood glucose: 143-216 FASTING  Lab Results  Component Value Date   HGBA1C 9.7 (A) 05/06/2022   Vitals:   05/24/22 0927  BP: 120/77  Pulse: 91  SpO2: 100%    Lipid Panel     Component Value Date/Time   CHOL 134 06/26/2021 0857   TRIG 124 06/26/2021 0857   HDL 39 (L) 06/26/2021 0857   CHOLHDL 3.4 06/26/2021 0857   CHOLHDL 4.7 07/11/2015 0906   VLDL 44 (H) 07/11/2015 0906   LDLCALC 73 06/26/2021 0857    A/P: Diabetes longstanding for 10 years currently improved control with recent adjustment in basal insulin. Patient is able to verbalize appropriate hypoglycemia management plan. Medication adherence appears good.   -Continued basal insulin Basaglar (insulin glartine) at 18 units daily.  - attempted to add/restart  GLP-1 Ozempic (semaglutide) injectable which patient preferred. She was less interested in using Trulcity (the formulary agent).  I agreed to submit and ask for an appeal /override due to injection technique preference.  -Continued SGLT2-I Farxiga (dapagliflozin) at 10 mg daily.  -Continued metformin 1000mg  BID.   -Patient educated on purpose, proper use, and potential adverse effects.  -Extensively discussed pathophysiology of diabetes, recommended lifestyle interventions, dietary effects on blood sugar control.  -Counseled on s/sx of and management of hypoglycemia.     Written patient instructions provided. Patient verbalized understanding of treatment plan.  Total time in face to face counseling 22 minutes.    Follow-up:  Pharmacist 06/21/2022.

## 2022-05-24 NOTE — Progress Notes (Signed)
Reviewed: I agree with Dr. Koval's documentation and management. 

## 2022-05-24 NOTE — Patient Instructions (Addendum)
Nice to see you today.   Today we agreed to retry the Ozempic - we are waiting for your insurance to review our request for an override to your formulary.   If this is approved and available to you, START 0.39m of Ozempic (semaglutide) once WEEKLY.    See you in about 1 month.

## 2022-05-24 NOTE — Assessment & Plan Note (Signed)
Diabetes longstanding for 10 years currently improved control with recent adjustment in basal insulin. Patient is able to verbalize appropriate hypoglycemia management plan. Medication adherence appears good.  -Continued basal insulin Basaglar (insulin glartine) at 18 units daily.  -attempted to add/restart GLP-1 Ozempic (semaglutide) injectable which patient preferred. She was less interested in using Trulcity (the formulary agent).  I agreed to submit and ask for an appeal /override due to injection technique preference.  -Continued SGLT2-I Farxiga (dapagliflozin) at 10 mg daily.  -Continued metformin 1051m BID.   -Patient educated on purpose, proper use, and potential adverse effects.  -Extensively discussed pathophysiology of diabetes, recommended lifestyle interventions, dietary effects on blood sugar control.  -Counseled on s/sx of and management of hypoglycemia.

## 2022-05-27 ENCOUNTER — Telehealth: Payer: Self-pay | Admitting: Pharmacist

## 2022-05-27 MED ORDER — TRULICITY 1.5 MG/0.5ML ~~LOC~~ SOAJ: 1.5000 mg | SUBCUTANEOUS | 2 refills | Status: DC

## 2022-05-27 NOTE — Telephone Encounter (Signed)
Noted and agree. 

## 2022-05-27 NOTE — Telephone Encounter (Signed)
Following investigation of insurance coverage.   Contacted patient and shared that her insurance would only reduce the cost of a non-formulary Ozempic (semaglutide) by 50%.   Given the cost of ~$1000 the patient cost would most likely be $400-500.   Patient agreed that this cost was prohibitive.   Following brief discussion, patient agreed to another trial of Trulicity.  We agreed that she would pick up prescription prior to next visit, bring it with her to the office with me in 1 month.   Options to minimize topical irritation and side effects can be discussed at that visit.    New prescription for Trulicity sent to pharmacy.

## 2022-06-21 ENCOUNTER — Other Ambulatory Visit (HOSPITAL_COMMUNITY): Payer: Self-pay

## 2022-06-21 ENCOUNTER — Ambulatory Visit: Payer: Commercial Managed Care - HMO | Admitting: Pharmacist

## 2022-06-21 ENCOUNTER — Encounter: Payer: Self-pay | Admitting: Pharmacist

## 2022-06-21 VITALS — BP 130/70 | HR 88 | Wt 147.2 lb

## 2022-06-21 DIAGNOSIS — E119 Type 2 diabetes mellitus without complications: Secondary | ICD-10-CM

## 2022-06-21 MED ORDER — TRULICITY 1.5 MG/0.5ML ~~LOC~~ SOAJ
1.5000 mg | SUBCUTANEOUS | 2 refills | Status: DC
  Filled 2022-06-21: qty 2, 28d supply, fill #0

## 2022-06-21 NOTE — Patient Instructions (Addendum)
It was nice to see you today!  Your goal blood sugar is 80-130 before eating and less than 180 after eating.  Medication Changes: If you are able to get Trulicity from your pharmacy, start Trulicity 1.5 mg once a week.  If you are unable to get Trulicity from your pharmacy, increase Basaglar (insulin glargine) from 18 units to 22 units.  Monitor blood sugars at home and keep a log (glucometer or piece of paper) to bring with you to your next visit. Try to check your blood sugar in the evenings at bedtime, at least 2-3 times per week.   Keep up the good work with diet and exercise. Aim for a diet full of vegetables, fruit and lean meats (chicken, [place], fish). Try to limit salt intake by eating fresh or frozen vegetables (instead of canned), rinse canned vegetables prior to cooking and do not add any additional salt to meals.

## 2022-06-21 NOTE — Progress Notes (Signed)
S:     Chief Complaint  Patient presents with   Medication Management    Diabetes management   47 y.o. female who presents for diabetes evaluation, education, and management.  PMH is significant for hypertension, hyperlipidemia.  Patient was referred and last seen by Primary Care Provider, Dr. Nita Sells, on 05/06/22.   At last visit, patient was switched from Jarratt (semaglutide) to Trulicity (dulaglutide) due to insurance coverage issues.   Today, patient arrives in good spirits and presents without  any assistance. States she is feeling constant pain in her left arm - feels like the pain is "inside her bone" and started over 2 months ago.   Patient reports Diabetes was diagnosed in 2013.   Current diabetes medications include: metformin 1000 mg twice daily, Basaglar (insulin glargine) 18 units once daily, Farxiga (dapagliflozin) 10 mg daily Current hypertension medications include: enalapril 2.5 mg daily Current hyperlipidemia medications include: atorvastatin 40 mg daily  Patient reports adherence to taking all medications as prescribed. Has not picked up Trulicity (dulaglutide) from pharmacy yet due to access (drug unavailable) issues.  Insurance coverage: [employer]  Patient denies hypoglycemic events. States she checks her blood sugars in the mornings and believes her readings have improved.   Reported home fasting blood sugars: 150s-190s, one reading over 200  (not currently checking post-prandial readings)  Patient reported dietary habits: states dinner is her largest meal of the day  Patient-reported exercise habits: walks outside when it is warm, approximately 35-40 minutes.    O:   Review of Systems  Musculoskeletal:        Reports persistent pain in left arm; started about 2 months ago   Physical Exam Constitutional:      Appearance: Normal appearance.  Pulmonary:     Effort: Pulmonary effort is normal.  Neurological:     Mental Status: She is alert.   Psychiatric:        Mood and Affect: Mood normal.        Behavior: Behavior normal.        Thought Content: Thought content normal.    Lab Results  Component Value Date   HGBA1C 9.7 (A) 05/06/2022   Vitals:   06/21/22 0906  BP: 130/70  Pulse: 88  SpO2: 100%    Lipid Panel     Component Value Date/Time   CHOL 134 06/26/2021 0857   TRIG 124 06/26/2021 0857   HDL 39 (L) 06/26/2021 0857   CHOLHDL 3.4 06/26/2021 0857   CHOLHDL 4.7 07/11/2015 0906   VLDL 44 (H) 07/11/2015 0906   LDLCALC 73 06/26/2021 0857    A/P: Diabetes diagnosed in 2013, currently not well controlled with most recent A1c at 9.7% and home fasting blood glucose levels as high as 190s-200. Patient is able to verbalize appropriate hypoglycemia management plan. Patient states she is adherent to home medications. Control is suboptimal due to issues obtaining Trulicity from pharmacy and not checking blood sugar regularly throughout the day. New prescription for Trulicity sent to pharmacy - plan going forward will depend on if the patient is able to obtain the medication. - If able to obtain Trulicity (dulaglutide) from pharmacy, initiate Trulicity 1.5 mg once weekly; continue Basaglar (insulin glargine) 18 units once daily - If unable to obtain Trulicity from pharmacy, increase Basaglar (insulin glargine) from 18 units to 22 units once daily -Continued SGLT2-I Farxiga (dapagliflozin) 10 mg daily -Continued metformin 1000 mg twice daily - Instructed patient to begin checking blood sugar in the evenings, after  dinner, at least 2-3 times per week and write down readings to bring in at next appointment. -Patient educated on purpose, proper use, and potential adverse effects -Extensively discussed pathophysiology of diabetes, recommended lifestyle interventions, dietary effects on blood sugar control.  -Counseled on s/sx of and management of hypoglycemia.  -Next A1c anticipated 07/2022.   Written patient instructions  provided. Patient verbalized understanding of treatment plan.  Total time in face to face counseling 25 minutes.    Follow-up:  Pharmacist in 06/30/22 PCP clinic visit in 06/30/22.  Patient seen with Louanne Belton PharmD PGY-1 Pharmacy Resident and Estelle June, PharmD Candidate.

## 2022-06-21 NOTE — Progress Notes (Signed)
Reviewed and agree with Dr Koval's plan.   

## 2022-06-21 NOTE — Assessment & Plan Note (Signed)
Diabetes diagnosed in 2013, currently not well controlled with most recent A1c at 9.7% and home fasting blood glucose levels as high as 190s-200. Patient is able to verbalize appropriate hypoglycemia management plan. Patient states she is adherent to home medications. Control is suboptimal due to issues obtaining Trulicity from pharmacy and not checking blood sugar regularly throughout the day. New prescription for Trulicity sent to pharmacy - plan going forward will depend on if the patient is able to obtain the medication. - If able to obtain Trulicity (dulaglutide) from pharmacy, initiate Trulicity 1.5 mg once weekly; continue Basaglar (insulin glargine) 18 units once daily - If unable to obtain Trulicity from pharmacy, increase Basaglar (insulin glargine) from 18 units to 22 units once daily -Continued SGLT2-I Farxiga (dapagliflozin) 10 mg daily -Continued metformin 1000 mg twice daily - Instructed patient to begin checking blood sugar in the evenings, after dinner, at least 2-3 times per week and write down readings to bring in at next appointment. -Patient educated on purpose, proper use, and potential adverse effects

## 2022-06-30 ENCOUNTER — Ambulatory Visit: Payer: Commercial Managed Care - HMO | Admitting: Family Medicine

## 2022-06-30 ENCOUNTER — Ambulatory Visit (INDEPENDENT_AMBULATORY_CARE_PROVIDER_SITE_OTHER): Payer: Commercial Managed Care - HMO | Admitting: Pharmacist

## 2022-06-30 ENCOUNTER — Other Ambulatory Visit: Payer: Self-pay

## 2022-06-30 ENCOUNTER — Encounter: Payer: Self-pay | Admitting: Pharmacist

## 2022-06-30 ENCOUNTER — Encounter: Payer: Self-pay | Admitting: Family Medicine

## 2022-06-30 ENCOUNTER — Telehealth: Payer: Self-pay

## 2022-06-30 ENCOUNTER — Other Ambulatory Visit (HOSPITAL_COMMUNITY): Payer: Self-pay

## 2022-06-30 VITALS — BP 123/76 | HR 79 | Ht 61.0 in | Wt 147.0 lb

## 2022-06-30 VITALS — BP 123/76 | HR 79 | Wt 147.0 lb

## 2022-06-30 DIAGNOSIS — E119 Type 2 diabetes mellitus without complications: Secondary | ICD-10-CM

## 2022-06-30 DIAGNOSIS — E782 Mixed hyperlipidemia: Secondary | ICD-10-CM

## 2022-06-30 DIAGNOSIS — M25512 Pain in left shoulder: Secondary | ICD-10-CM | POA: Diagnosis not present

## 2022-06-30 DIAGNOSIS — I10 Essential (primary) hypertension: Secondary | ICD-10-CM

## 2022-06-30 NOTE — Telephone Encounter (Signed)
Prior Auth for patients medication TRULICITY initiated and approved by [employer] from 06/30/22 to 06/30/23.  CoverMyMeds Key: [id number] PA Case ID #: [id number]

## 2022-06-30 NOTE — Progress Notes (Signed)
Reviewed and agree with Dr Koval's plan.   

## 2022-06-30 NOTE — Patient Instructions (Signed)
It was great to see you today!  We are not making any changes to your medications. We are currently working on the prior authorization for your Trulicity so that you can pick it up from your pharmacy.

## 2022-06-30 NOTE — Patient Instructions (Addendum)
It was wonderful to see you today.  Please bring ALL of your medications with you to every visit.   Today we talked about:   For your shoulder:  -Rest! -Ice your shoulder -Use Voltaren gel as needed to the area -You can use Salonpas as needed -I am sending a referral to Physical Therapy -I have attached shoulder exercises below -If no improvement, we can send you to sports medicine    You are overdue for an eye examination. Please call the office below.  Clent Demark Rankin, MD Address: [address], [place], [place] [address] Phone: [phone]  Thank you for coming to your visit as scheduled. We have had a large "no-show" problem lately, and this significantly limits our ability to see and care for patients. As a friendly reminder- if you cannot make your appointment please call to cancel. We do have a no show policy for those who do not cancel within 24 hours. Our policy is that if you miss or fail to cancel an appointment within 24 hours, 3 times in a 10-month period, you may be dismissed from our clinic.   Thank you for choosing [facility].   Please call [phone] with any questions about today's appointment.  Please be sure to schedule follow up at the front  desk before you leave today.   Sharion Settler, DO PGY-3 Family Medicine    Secondary Shoulder Impingement Syndrome Rehab Ask your health care provider which exercises are safe for you. Do exercises exactly as told by your provider and adjust them as told. It is normal to feel mild stretching, pulling, tightness, or discomfort as you do these exercises. Stop right away if you feel sudden pain or your pain gets worse. Do not begin these exercises until told by your provider. Stretching and range-of-motion exercise This exercise warms up your muscles and joints. It also improves the movement and flexibility of your neck and shoulder. This exercise also helps to relieve pain and stiffness. Cervical side  bend  Using good posture, sit on a stable chair or stand up. Without moving your shoulders, slowly tilt your left / right ear toward your left / right shoulder until you feel a stretch in your neck (cervical) muscles on the other side. You should be looking straight ahead. Hold for __________ seconds. Slowly return to the starting position. Repeat the stretch on your left / right side. Repeat __________ times. Complete this exercise __________ times a day. Strengthening exercises These exercises build strength and endurance in your shoulder. Endurance is the ability to use your muscles for a long time, even after they get tired. Scapular protraction, supine, isotonic  Lie on your back on a firm surface (supine position). Hold a __________ weight in your left / right hand. Raise your left / right arm straight into the air so your hand is directly above your shoulder joint. Push the weight into the air so your shoulder (scapula) lifts off the surface that you are lying on. The scapula will push up or forward (protraction). Do not move your head, neck, or back. Hold for __________ seconds. Slowly return to the starting position. Let your muscles relax completely before you repeat this exercise. Repeat __________ times. Complete this exercise __________ times a day. Scapular retraction, isotonic  Sit in a stable chair without armrests or stand up. Secure an exercise band to a stable object in front of you so the band is at shoulder height. Hold one end of the exercise  band in each hand. Squeeze your shoulder blades (scapulae) together and move your elbows slightly behind you (retraction). Do not shrug your shoulders upward while you do this. Hold for __________ seconds. Slowly return to the starting position. Repeat __________ times. Complete this exercise __________ times a day. Shoulder extension with scapular retraction, isotonic  Sit in a stable chair without armrests or stand  up. Secure an exercise band to a stable object in front of you so the band is above shoulder height. Hold one end of the exercise band in each hand. Straighten your elbows and lift your hands up to shoulder height. Squeeze your shoulder blades together (scapular retraction) and pull your hands down to the sides of your thighs (shoulder extension). Stop when your hands are straight down by your sides. Do not let your hands go behind your body. Hold for __________ seconds. Slowly return to the starting position. Repeat __________ times. Complete this exercise __________ times a day. Shoulder abduction, isotonic  Sit in a stable chair without armrests or stand up. If told, hold a __________ weight in your left / right hand. Start with your arms straight down. Turn your left / right hand so your palm faces in, toward your body. Slowly lift your left / right hand out to your side (abduction). Do not lift your hand above shoulder height. Keep your arms straight. Avoid shrugging your shoulder while you do this movement. Keep your shoulder blade tucked down toward the middle of your back. Hold for __________ seconds. Slowly lower your arm, and return to the starting position. Repeat __________ times. Complete this exercise __________ times a day. This information is not intended to replace advice given to you by your health care provider. Make sure you discuss any questions you have with your health care provider. Document Revised: 12/24/2021 Document Reviewed: 12/24/2021 Elsevier Patient Education  [place].

## 2022-06-30 NOTE — Assessment & Plan Note (Signed)
Diabetes diagnosed in 2013, currently fairly uncontrolled with patient reporting post-prandial blood glucose levels in 200s-300s. Patient is able to verbalize appropriate hypoglycemia management plan. Patient reports being adherent to medications. Control is suboptimal due to dietary indiscretions and being unable to obtain Trulicity (dulaglutide) from pharmacy.  Clarified availability and PA process to be completed in the next 24-48 hours (Pharmacy faxing PA request to our office) -Continue metformin 1000 mg twice -Continue SGLT-2 inhibitor Farxiga (dapagliflozin) 10 mg daily -Continue Basaglar (insulin glargine) 22 units daily - Initiate Trulicity (dulaglutide) 1.5 mg once weekly -Patient educated on purpose, proper use, and potential adverse effects -Extensively discussed pathophysiology of diabetes, recommended lifestyle interventions, dietary effects on blood sugar control.  -Counseled on s/sx of and management of hypoglycemia.

## 2022-06-30 NOTE — Progress Notes (Signed)
S:     Chief Complaint  Patient presents with   Medication Management    diabetes   47 y.o. female who presents for diabetes evaluation, education, and management.  PMH is significant for hypertension, hyperlipidemia.  Patient was referred and last seen by Primary Care Provider, Dr. Nita Sells, on 05/06/22.   At last visit, prescription for Trulicity (dulaglutide) was sent to patient's pharmacy. Today, patient states she was unable to pick up her medication due to needing a prior authorization.    Today, patient arrives in good spirits and presents without any assistance.  Patient reports Diabetes was diagnosed in 2013.   Current diabetes medications include: metformin 1000 mg twice daily, Basaglar (insulin glargine) 22 units once daily, Farxiga (dapagliflozin) 10 mg daily Current hypertension medications include: enalapril 2.5 mg daily Current hyperlipidemia medications include: atorvastatin 40 mg daily  Patient reports adherence to taking all medications as prescribed. States she has not been able to obtain Trulicity (dulaglutide) from her pharmacy, so she has increased her Lantus (insulin glargine) to 22 units daily.   Insurance coverage: [employer]  Patient denies hypoglycemic events.  Reported home fasting blood sugars: ranging from the 130s to the low 170s  Reported 2 hour post-meal/random blood sugars: 176, 201, 300.  Patient reports nocturia (nighttime urination). States this occurs about once weekly.  Patient denies neuropathy (nerve pain). Patient denies visual changes.  Patient reported dietary habits: eats 2-3 meals per day; lunch is the largest meal of the day; likes broccoli, spinach, eats a lot of rice and noodles.  O:   Review of Systems  All other systems reviewed and are negative.   Physical Exam Constitutional:      Appearance: Normal appearance.  Pulmonary:     Effort: Pulmonary effort is normal.  Neurological:     Mental Status: She is alert.   Psychiatric:        Mood and Affect: Mood normal.        Behavior: Behavior normal.    Lab Results  Component Value Date   HGBA1C 9.7 (A) 05/06/2022   Vitals:   06/30/22 0851  BP: 123/76  Pulse: 79  SpO2: 100%    Lipid Panel     Component Value Date/Time   CHOL 134 06/26/2021 0857   TRIG 124 06/26/2021 0857   HDL 39 (L) 06/26/2021 0857   CHOLHDL 3.4 06/26/2021 0857   CHOLHDL 4.7 07/11/2015 0906   VLDL 44 (H) 07/11/2015 0906   LDLCALC 73 06/26/2021 0857    Clinical Atherosclerotic Cardiovascular Disease (ASCVD): No  The 10-year ASCVD risk score (Arnett DK, et al., 2019) is: 1.6%   Values used to calculate the score:     Age: 77 years     Sex: Female     Is Non-Hispanic African American: No     Diabetic: Yes     Tobacco smoker: No     Systolic Blood Pressure: [id number]     Is BP treated: Yes     HDL Cholesterol: 39 mg/dL     Total Cholesterol: 134 mg/dL    A/P: Diabetes diagnosed in 2013, currently fairly uncontrolled with patient reporting post-prandial blood glucose levels in 200s-300s. Patient is able to verbalize appropriate hypoglycemia management plan. Patient reports being adherent to medications. Control is suboptimal due to dietary indiscretions and being unable to obtain Trulicity (dulaglutide) from pharmacy.  Clarified availability and PA process to be completed in the next 24-48 hours (Pharmacy faxing PA request to our office) -Continue  metformin 1000 mg twice -Continue SGLT-2 inhibitor Farxiga (dapagliflozin) 10 mg daily -Continue Basaglar (insulin glargine) 22 units daily - Initiate Trulicity (dulaglutide) 1.5 mg once weekly -Patient educated on purpose, proper use, and potential adverse effects -Extensively discussed pathophysiology of diabetes, recommended lifestyle interventions, dietary effects on blood sugar control.  -Counseled on s/sx of and management of hypoglycemia.   Written patient instructions provided. Patient verbalized understanding of  treatment plan.  Total time in face to face counseling 15 minutes.    Follow-up:  Pharmacist in 3-4 weeks. Patient seen with Louanne Belton PharmD PGY-1 Pharmacy Resident and Estelle June, PharmD Candidate.

## 2022-06-30 NOTE — Progress Notes (Signed)
    SUBJECTIVE:   CHIEF COMPLAINT / HPI:   [name] is a 47 y.o. female who presents to the [facility] clinic today to discuss the following concerns:   Diabetes, Type 2 - Last A1c 9.7 in January  - Medications: Basaglar 22U daily, Metformin 1000 mg BID, farxiga 10 mg daily. Unfortunately has been out of Trulicity for a while (awaiting prior auth) - Compliance: Good  - Checking BG at home: once to twice a day. Ranges 130s-170s in the AM and 170-300 at night.  - Diet: Still working on some improvements - Exercise: Walking 30-35 minutes most days - Eye exam: Due, referral placed in January.  - Foot exam: UTD - Microalbumin: UTD  - Statin: Atorvastatin 40 mg  - Denies symptoms of hypoglycemia, polyuria, polydipsia, numbness extremities, foot ulcers/trauma  Left Arm/Shoulder Pain Ongoing for about 2 months. Describes it as a sharp pain. Worse with flexion and extension of arm. Radiates down arm. Has tried Ibuprofen which didn't help. She tried salonpas which helped slightly. Using a loose compression brace.   PERTINENT  PMH / PSH: HTN, HLD  OBJECTIVE:   BP 123/76   Pulse 79   Ht 5\' 1"  (1.549 m)   Wt 147 lb (66.7 kg)   SpO2 100%   BMI 27.78 kg/m    General: NAD, pleasant, able to participate in exam Respiratory: normal effort Neck: Full ROM. No midline c-spine tenderness. Negative Spurling b/l  Left Shoulder: Inspection reveals no obvious deformity, atrophy, or asymmetry b/l. No bruising. No swelling Palpation is normal with no TTP over [facility] joint or bicipital groove b/l. Full ROM in flexion, abduction, internal/external rotation b/l though some discomfort noted  NV intact distally b/l Special Tests:  - Impingement: Pos Hawkins, neers, empty can sign. - Supraspinatous: Neg empty can though some discomfort noted  - Infraspinatous/Teres Minor: 5/5 strength with ER - Subscapularis: 5/5 strength with IR - Biceps tendon: Negative Speeds - Labrum:   good stability - No painful arc and  no drop arm sign Skin: warm and dry, no rashes noted Psych: Normal affect and mood  ASSESSMENT/PLAN:   1. Type 2 diabetes mellitus without complication, without long-term current use of insulin (HCC) UTD on A1c, next due next month. Fasting sugars are still elevated above goal. Suspect she will have improved control with restarting GLP-1 so will not adjust insulin at this time.  - Basic Metabolic Panel - Continue Basaglar 22 units daily, metformin 1000 mg twice daily, Farxiga 10 mg daily. -Appreciate Dr. Kerin Ransom assistance with PA for Trulicity -Information for ophthalmology office provided today for eye examination -Continue statin, ACE-I -UTD foot exam -Scheduled to see me 4/25 for A1c   2. Mixed hyperlipidemia Goal LDL <70 - Lipid Panel - Continue Atorvastatin 40 mg   3. Left shoulder pain, unspecified chronicity Appears most consistent with impingement of shoulder. Good ROM so does not seem consistent with frozen shoulder. No hx of trauma and good rotator cuff strength so doubt tear. Could be starting to have development of OA.  - Supportive care measures given: stretches, rest, ice, voltaren gel - Ambulatory referral to Physical Therapy - If worsening or no improvement, can send to [facility] for evaluation  4. Primary hypertension Normotensive today. On SGLT-2 and low-dose ACE-I.  -BMP today  -Continue enalapril     Sharion Settler, DO [place]

## 2022-07-01 ENCOUNTER — Encounter (HOSPITAL_BASED_OUTPATIENT_CLINIC_OR_DEPARTMENT_OTHER): Payer: Self-pay | Admitting: Family Medicine

## 2022-07-01 LAB — BASIC METABOLIC PANEL
BUN/Creatinine Ratio: 20 (ref 9–23)
BUN: 12 mg/dL (ref 6–24)
CO2: 21 mmol/L (ref 20–29)
Calcium: 9.1 mg/dL (ref 8.7–10.2)
Chloride: 102 mmol/L (ref 96–106)
Creatinine, Ser: 0.6 mg/dL (ref 0.57–1.00)
Glucose: 137 mg/dL — ABNORMAL HIGH (ref 70–99)
Potassium: 4.2 mmol/L (ref 3.5–5.2)
Sodium: 138 mmol/L (ref 134–144)
eGFR: 113 mL/min/{1.73_m2} (ref >59)

## 2022-07-01 LAB — LIPID PANEL
Chol/HDL Ratio: 2.1 ratio (ref 0.0–4.4)
Cholesterol, Total: 103 mg/dL (ref 100–199)
HDL: 49 mg/dL (ref >39)
LDL Chol Calc (NIH): 36 mg/dL (ref 0–99)
Triglycerides: 94 mg/dL (ref 0–149)
VLDL Cholesterol Cal: 18 mg/dL (ref 5–40)

## 2022-07-01 NOTE — Progress Notes (Signed)
Reviewed and agree with Dr Koval's plan.   

## 2022-07-09 ENCOUNTER — Other Ambulatory Visit (HOSPITAL_COMMUNITY): Payer: Self-pay

## 2022-07-19 ENCOUNTER — Other Ambulatory Visit: Payer: Self-pay | Admitting: Family Medicine

## 2022-07-19 DIAGNOSIS — E119 Type 2 diabetes mellitus without complications: Secondary | ICD-10-CM

## 2022-07-23 ENCOUNTER — Other Ambulatory Visit: Payer: Self-pay

## 2022-08-04 NOTE — Patient Instructions (Addendum)
It was wonderful to see you today.  Please bring ALL of your medications with you to every visit.   Today we talked about:  Your A1c was slightly better today, but we are still not at our goal.  Medication changes: Increase your Basaglar to 26 units every morning. START Insulin lispro (fast-acting insulin). Take 5 units with dinner every day.  Continue to check your sugars in the morning and evening.  You will meet with Dr. Raymondo Band in May and he may have further recommendations for your insulin. I have also sent another referral to the eye doctor.   Your next A1c will be in July.   Thank you for coming to your visit as scheduled. We have had a large "no-show" problem lately, and this significantly limits our ability to see and care for patients. As a friendly reminder- if you cannot make your appointment please call to cancel. We do have a no show policy for those who do not cancel within 24 hours. Our policy is that if you miss or fail to cancel an appointment within 24 hours, 3 times in a 56-month period, you may be dismissed from our clinic.   Thank you for choosing [facility].   Please call [phone] with any questions about today's appointment.  Please be sure to schedule follow up at the front  desk before you leave today.   Sabino Dick, DO PGY-3 Family Medicine

## 2022-08-04 NOTE — Progress Notes (Signed)
    SUBJECTIVE:   CHIEF COMPLAINT / HPI:   [name] is a 47 y.o. female who presents to the [facility] clinic today to discuss the following concerns:   Diabetes, Type 2 - Last A1c 9.7 in January  - Medications: 22 units insulin Basaglar, 10 mg Farxiga, 1000 mg twice daily of metformin, states she does not yet have her Trulicity.  - Compliance: Good - Checking BG at home: Checks fasting sugars mostly, sometimes checks night time sugars. Brought log- fasting sugars range 130s-170s. Evening sugars are 156-312 (only 6 readings in the last month).  - Exercise: Walking about 30 minutes- every day as long as its not raining  - Eye exam: Due. Re-placing referral today  - Foot exam: UTD - Microalbumin: UTD - Statin: Atorvastatin 40 mg daily   PERTINENT  PMH / PSH: HTN, HLD  OBJECTIVE:   BP 126/77   Pulse 88   Ht 5\' 1"  (1.549 m)   Wt 144 lb 6.4 oz (65.5 kg)   LMP 07/29/2022 (Exact Date)   SpO2 100%   BMI 27.28 kg/m    General: NAD, pleasant, able to participate in exam Respiratory: normal effort  Psych: Normal affect and mood  ASSESSMENT/PLAN:   1. Type 2 diabetes mellitus without complication, with long-term current use of insulin A1c 9.3 today, slight improvement from previous of 9.7 in January. Fasting sugars still high and evening sugars are also high. Will need to increase insulin to get better ranges. Unfortunately still having insurance barriers with Trulicity.  - HgB A1c - Ambulatory referral to Ophthalmology (re-sent as pt reports having difficulty getting in with previous office she was referred to) - INCREEASE Insulin Glargine (BASAGLAR KWIKPEN) 100 UNIT/ML; Inject 26 Units into the skin daily.  Dispense: 15 mL; Refill: 0 - START insulin lispro (HUMALOG) 100 UNIT/ML cartridge; Inject 5 units daily with dinner.  Dispense: 15 mL; Refill: 11 - Continue statin  - Appt with Dr. Raymondo Band in May for further insulin titration if needed  2. Primary hypertension Normotensive. BMP and  microalbumin UTD.  -Continue Enalapril 2.5 mg, farxiga 10 mg daily    Sabino Dick, DO [facility]

## 2022-08-05 ENCOUNTER — Encounter: Payer: Self-pay | Admitting: Family Medicine

## 2022-08-05 ENCOUNTER — Other Ambulatory Visit: Payer: Self-pay

## 2022-08-05 ENCOUNTER — Ambulatory Visit: Payer: Commercial Managed Care - HMO | Admitting: Family Medicine

## 2022-08-05 VITALS — BP 126/77 | HR 88 | Ht 61.0 in | Wt 144.4 lb

## 2022-08-05 DIAGNOSIS — I10 Essential (primary) hypertension: Secondary | ICD-10-CM

## 2022-08-05 DIAGNOSIS — Z794 Long term (current) use of insulin: Secondary | ICD-10-CM | POA: Diagnosis not present

## 2022-08-05 DIAGNOSIS — E119 Type 2 diabetes mellitus without complications: Secondary | ICD-10-CM | POA: Diagnosis not present

## 2022-08-05 LAB — POCT GLYCOSYLATED HEMOGLOBIN (HGB A1C): HbA1c, POC (controlled diabetic range): 9.3 % — AB (ref 0.0–7.0)

## 2022-08-05 MED ORDER — BASAGLAR KWIKPEN 100 UNIT/ML ~~LOC~~ SOPN: 26.0000 [IU] | PEN_INJECTOR | Freq: Every day | SUBCUTANEOUS | 0 refills | Status: DC

## 2022-08-05 MED ORDER — HUMALOG 100 UNIT/ML ~~LOC~~ SOCT
SUBCUTANEOUS | 11 refills | Status: DC
Start: 2022-08-05 — End: 2022-08-24

## 2022-08-24 ENCOUNTER — Ambulatory Visit: Payer: Commercial Managed Care - HMO | Admitting: Pharmacist

## 2022-08-24 ENCOUNTER — Other Ambulatory Visit (HOSPITAL_COMMUNITY): Payer: Self-pay

## 2022-08-24 ENCOUNTER — Encounter: Payer: Self-pay | Admitting: Pharmacist

## 2022-08-24 VITALS — BP 127/74 | HR 93 | Ht 63.0 in | Wt 145.0 lb

## 2022-08-24 DIAGNOSIS — E119 Type 2 diabetes mellitus without complications: Secondary | ICD-10-CM | POA: Diagnosis not present

## 2022-08-24 DIAGNOSIS — Z794 Long term (current) use of insulin: Secondary | ICD-10-CM

## 2022-08-24 MED ORDER — TRULICITY 1.5 MG/0.5ML ~~LOC~~ SOAJ: 1.5000 mg | SUBCUTANEOUS | 0 refills | Status: DC

## 2022-08-24 MED ORDER — INSULIN LISPRO (1 UNIT DIAL) 100 UNIT/ML (KWIKPEN)
5.0000 [IU] | PEN_INJECTOR | Freq: Two times a day (BID) | SUBCUTANEOUS | 11 refills | Status: DC
Start: 2022-08-24 — End: 2022-09-14

## 2022-08-24 MED ORDER — FREESTYLE LIBRE 3 SENSOR MISC
1.0000 | 3 refills | Status: AC
Start: 2022-08-24 — End: 2022-09-21

## 2022-08-24 NOTE — Progress Notes (Signed)
S:     Chief Complaint  Patient presents with   Medication Management    DM follow up   47 y.o. female who presents for diabetes evaluation, education, and management.  PMH is significant for T2DM, HTN. Patient was referred and last seen by Primary Care Provider, Dr. Melba Coon, on 08/05/2022.  At last visit, Basaglar was increased to 26 units daily, patient had not been taking Trulicity.  Today, patient arrives in good spirits and presents without any assistance.   Diabetes diagnosed in 2013.  Current diabetes medications include: dapagliflozin (Farxiga) 10 mg, Basaglar (insulin glargine) 26 units daily, Humalog (insulin lispro) 5 units with supper (currently not taking). Current hypertension medications include: enalapril (Vasotec) 2.5 mg Current hyperlipidemia medications include: atorvastatin (Lipitor) 40 mg  Patient reports adherence to taking most medications as prescribed. Currently not taking Humalog due prescribing error. Currently not taking Trulicity (dulaglutide). Patient prefers to inject medications slowly and does not prefer the auto-injector. Patient reported previously using Ozempic with success.  Do you feel that your medications are working for you? yes Have you been experiencing any side effects to the medications prescribed? no Do you have any problems obtaining medications due to transportation or finances? no Insurance coverage: [employer]  Patient denies true hypoglycemic events. Patient reports some dizziness, shakes when she misses a meal. Reports glucose in the 80s-90s when this happens, but manages episodes well.  Reported home fasting blood sugars: 130-170s  Patient denies nocturia (nighttime urination).  Patient denies neuropathy (nerve pain). Patient denies visual changes. Patient denies self foot exams.   Patient reported dietary habits: Eats 2 meals/day. Her and her mother cook most of the time. Lunch: rice, vegetables, pork or chicken 1-3  pm Dinner: rice, vegetables, pork or chicken  Snacks: nuts, seeds Drinks: coffee with Splenda and powder AM, water during the day  Within the past 12 months, did you worry whether your food would run out before you got money to buy more? no Within the past 12 months, did the food you bought run out, and you didn't have money to get more? no  O:   Review of Systems  All other systems reviewed and are negative.   Physical Exam Constitutional:      Appearance: Normal appearance.  Neurological:     Mental Status: She is alert.  Psychiatric:        Mood and Affect: Mood normal.        Behavior: Behavior normal.     Lab Results  Component Value Date   HGBA1C 9.3 (A) 08/05/2022   Vitals:   08/24/22 0845  BP: 127/74  Pulse: 93  SpO2: 100%    Lipid Panel     Component Value Date/Time   CHOL 103 06/30/2022 1212   TRIG 94 06/30/2022 1212   HDL 49 06/30/2022 1212   CHOLHDL 2.1 06/30/2022 1212   CHOLHDL 4.7 07/11/2015 0906   VLDL 44 (H) 07/11/2015 0906   LDLCALC 36 06/30/2022 1212    Clinical Atherosclerotic Cardiovascular Disease (ASCVD): No  The ASCVD Risk score (Arnett DK, et al., 2019) failed to calculate for the following reasons:   The valid total cholesterol range is 130 to 320 mg/dL   A/P: Diabetes longstanding since 2013 currently uncontrolled with A1c 9.3% in April 2024. Patient is able to verbalize appropriate hypoglycemia management plan. Medication adherence appears fair. Control is suboptimal due to poor adherence to Trulicity due to autoinjector. Formulary limited to Trulicity with [employer].  -Continued metformin  1000 mg BID.  -Continued basal insulin Basaglar (insulin glargine) at 26 units daily in the morning.  -Started rapid insulin Humalog (insulin lispro) pens 5 units with lunch and supper. Educated patient to take insulin with meals and not to inject if she misses a meal. - New prescription provided for pens.  -Restarted GLP-1 Trulicity  (dulaglutide) at 1.5 mg for 1 month with plans to increase to 3 mg if tolerated. Explained insurance coverage limitations of GLP-1s. Patient agreeable to try auto-injector again.  -Placed FreeStyle Libre 3 sensor at visit. Educated patient on purpose and use. -Patient educated on purpose, proper use, and potential adverse effects of medications.  -Extensively discussed pathophysiology of diabetes, recommended lifestyle interventions, dietary effects on blood sugar control.  -Counseled on s/sx of and management of hypoglycemia.  -Next A1c anticipated July 2024.   ASCVD risk - primary prevention in patient with diabetes. Last LDL is 36 mg/dL at goal of <16 mg/dL. ASCVD risk factors include diabetes. -Continued atorvastatin 40 mg.  Hypertension longstanding currently well controlled on enalapril 2.5 mg daily. Blood pressure goal of <130/80 mmHg. Medication adherence good. -Continued enalapril 2.5 mg daily.  Written patient instructions provided. Patient verbalized understanding of treatment plan.  Total time in face to face counseling 39 minutes.    Follow-up:  Pharmacist 6/4 at 8:30 AM. PCP clinic visit in TBD. Patient seen with Haze Boyden PharmD Candidate.

## 2022-08-24 NOTE — Patient Instructions (Addendum)
It was nice to see you today!  Your goal blood sugar is 80-130 before eating and less than 180 after eating.  Medication Changes:  Begin Dulaglutide (Trulicity) 1.5 mg weekly auto injector.   Begin Humalog 5 units with meals (Lunch and Dinner).  Begin using Libre 3 sensors every 14 days to track blood glucose levels.  Continue all other medications.  Monitor blood sugars at home and keep a log (glucometer or piece of paper) to bring with you to your next visit.  Keep up the good work with diet and exercise. Aim for a diet full of vegetables, fruit and lean meats (chicken, [place], fish). Try to limit salt intake by eating fresh or frozen vegetables (instead of canned), rinse canned vegetables prior to cooking and do not add any additional salt to meals.

## 2022-08-24 NOTE — Assessment & Plan Note (Signed)
Diabetes longstanding since 2013 currently uncontrolled with A1c 9.3% in April 2024. Patient is able to verbalize appropriate hypoglycemia management plan. Medication adherence appears fair. Control is suboptimal due to poor adherence to Trulicity due to autoinjector. Formulary limited to Trulicity with [employer].  -Continued metformin 1000 mg BID.  -Continued basal insulin Basaglar (insulin glargine) at 26 units daily in the morning.  -Started rapid insulin Humalog (insulin lispro) pens 5 units with lunch and supper. Educated patient to take insulin with meals and not to inject if she misses a meal. - New prescription provided for pens.  -Restarted GLP-1 Trulicity (dulaglutide) at 1.5 mg for 1 month with plans to increase to 3 mg if tolerated. Explained insurance coverage limitations of GLP-1s. Patient agreeable to try auto-injector again.  -Placed FreeStyle Libre 3 sensor at visit. Educated patient on purpose and use. -Patient educated on purpose, proper use, and potential adverse effects of medications.  -Extensively discussed pathophysiology of diabetes, recommended lifestyle interventions, dietary effects on blood sugar control.

## 2022-08-25 NOTE — Progress Notes (Signed)
Reviewed and agree with Dr Koval's plan.   

## 2022-09-14 ENCOUNTER — Ambulatory Visit: Payer: Commercial Managed Care - HMO | Admitting: Pharmacist

## 2022-09-14 ENCOUNTER — Encounter: Payer: Self-pay | Admitting: Pharmacist

## 2022-09-14 VITALS — BP 120/84 | HR 94 | Ht 61.0 in | Wt 144.8 lb

## 2022-09-14 DIAGNOSIS — Z794 Long term (current) use of insulin: Secondary | ICD-10-CM | POA: Diagnosis not present

## 2022-09-14 DIAGNOSIS — E119 Type 2 diabetes mellitus without complications: Secondary | ICD-10-CM

## 2022-09-14 MED ORDER — BASAGLAR KWIKPEN 100 UNIT/ML ~~LOC~~ SOPN
22.0000 [IU] | PEN_INJECTOR | Freq: Every day | SUBCUTANEOUS | 1 refills | Status: AC
Start: 2022-09-14 — End: ?

## 2022-09-14 MED ORDER — HUMALOG KWIKPEN 200 UNIT/ML ~~LOC~~ SOPN
8.0000 [IU] | PEN_INJECTOR | Freq: Every day | SUBCUTANEOUS | 11 refills | Status: DC
Start: 2022-09-14 — End: 2023-02-23

## 2022-09-14 MED ORDER — TRULICITY 1.5 MG/0.5ML ~~LOC~~ SOAJ
1.5000 mg | SUBCUTANEOUS | 6 refills | Status: DC
Start: 2022-09-14 — End: 2023-05-26

## 2022-09-14 MED ORDER — METFORMIN HCL ER 500 MG PO TB24
2000.0000 mg | ORAL_TABLET | Freq: Every day | ORAL | 3 refills | Status: DC
Start: 2022-09-14 — End: 2023-03-23

## 2022-09-14 NOTE — Patient Instructions (Signed)
It was nice to see you today!  Your goal blood sugar is 80-130 before eating and less than 180 after eating.  Medication Changes: Continue  Farxiga 10 mg once daily and Trulicity 1.5 mg into the skin once weekly  Adjust Metformin to 2000 mg (4 tablets) once daily with dinner  Increase Humalog to 8 units once daily before dinner  Decrease Basaglar to 22 units once daily  Schedule to come to clinic in 2 months  Monitor blood sugars at home and keep a log (glucometer or piece of paper) to bring with you to your next visit.  Keep up the good work with diet and exercise. Aim for a diet full of vegetables, fruit and lean meats (chicken, [place], fish). Try to limit salt intake by eating fresh or frozen vegetables (instead of canned), rinse canned vegetables prior to cooking and do not add any additional salt to meals.

## 2022-09-14 NOTE — Assessment & Plan Note (Signed)
Diabetes longstanding currently close to goal based on CGM report. Medication adherence appears appropriate. Control is suboptimal due to dietary indiscretions and timing of mealtime insulin administration. -Decreased dose of basal insulin Basaglar (insulin glargine) 26 units to 22 units daily in the morning.  -Increased dose of rapid insulin Humalog (insulin lispro) from 5 to 8 units before dinner. Updated instructions to once daily to reflect how she is administering this medication. -Continued GLP-1 Trulicity (dulaglutide) 1.5 mg weekly -Continued SGLT2-I Farxiga (dapagliflozin) 10 mg daily  -Adjusted metformin from 1000mg  IR tablets (taking 2 with dinner) to 500 mg XR 4 tablets with dinner.  -Patient educated on purpose, proper use, and potential adverse effects of metformin, insulin, and Trulicity.  -Next A1c anticipated 10/2022.

## 2022-09-14 NOTE — Progress Notes (Signed)
S:     Chief Complaint  Patient presents with   Medication Management    DM   47 y.o. female who presents for diabetes evaluation, education, and management.  PMH is significant for T2DM.  Patient was referred and last seen by Primary Care Provider, Dr. Melba Coon, on 08/05/22.   At last visit with the pharmacy team, she was restarted on Trulicity 1.5 mg weekly and Humalog 5 units with lunch and dinner. Freestyle Libre 3 sensor was also placed at that time   Today, patient arrives in good spirits and presents without any assistance. She continues to report hesitancy with use of Trulicity given the auto-injector function and site pain with injection. Unable to download CGM today, however, the report was pulled up on her cell phone.  Patient reports Diabetes was diagnosed in 2013.   Current diabetes medications include: Basaglar (insulin glargine) 26 units once daily, Humalog (insulin lispro) 5 units with lunch and supper (taking once daily with supper), Trulicity 1.5 mg weekly, Farxiga (dapagliflozin) 10 mg daily, metformin 1000 mg BID (taking 2000 mg with dinner) Current hypertension medications include: enalapril 2.5 mg daily Current hyperlipidemia medications include: atorvastatin 40 mg daily  Patient reports adherence to taking all medications, however some variations reported as listed above.  Do you feel that your medications are working for you? yes Have you been experiencing any side effects to the medications prescribed? Yes - injection site pain from [employer] coverage: [employer]  Patient reports nocturnal hypoglycemic CGM alarm events without symptoms.  Patient denies nocturia (nighttime urination).  Patient denies neuropathy (nerve pain). Patient denies visual changes. Patient reports self foot exams.   Patient reported dietary habits: Eats 3 meals/day Dinner: vietnamese noodles, rice Drinks: sweet tea (in smaller  amounts)  Patient-reported exercise habits: walks every evening before dinner   O:   Review of Systems  All other systems reviewed and are negative.   Physical Exam Neurological:     Mental Status: She is alert.  Psychiatric:        Mood and Affect: Mood normal.        Behavior: Behavior normal.        Thought Content: Thought content normal.        Judgment: Judgment normal.    CGM Download:  Time in Goal:  - Time in range 70-180: 82% - Time above range: 14% - Time below range: 4% Observed patterns: most significant hyperglycemia after evening meals   Lab Results  Component Value Date   HGBA1C 9.3 (A) 08/05/2022   Vitals:   09/14/22 0910  BP: 120/84  Pulse: 94  SpO2: 100%    Lipid Panel     Component Value Date/Time   CHOL 103 06/30/2022 1212   TRIG 94 06/30/2022 1212   HDL 49 06/30/2022 1212   CHOLHDL 2.1 06/30/2022 1212   CHOLHDL 4.7 07/11/2015 0906   VLDL 44 (H) 07/11/2015 0906   LDLCALC 36 06/30/2022 1212    A/P: Diabetes longstanding currently close to goal based on CGM report. Medication adherence appears appropriate. Control is suboptimal due to dietary indiscretions and timing of mealtime insulin administration. -Decreased dose of basal insulin Basaglar (insulin glargine) 26 units to 22 units daily in the morning.  -Increased dose of rapid insulin Humalog (insulin lispro) from 5 to 8 units before dinner. Updated instructions to once daily to reflect how she is administering this medication. -Continued GLP-1 Trulicity (dulaglutide) 1.5 mg weekly -Continued SGLT2-I Farxiga (dapagliflozin) 10 mg  daily  - Adjusted  metformin from 1000mg  IR tablets (taking 2 with dinner) to 500 mg XR 4 tablets with dinner.  -Patient educated on purpose, proper use, and potential adverse effects of metformin, insulin, and Trulicity.  -Next A1c anticipated 10/2022.  - Schedule follow up in 2 months. She might be seeing a new PCP outside of this office. Will be unable to  follow up with pharmacy team if that is the case  ASCVD risk - primary prevention in patient with diabetes. Last LDL is 36 at goal of <70 mg/dL. ASCVD risk factors include diabetes. -Continued atorvastatin 40 mg.   Hypertension longstanding currently controlled. Blood pressure goal of <130/80 mmHg. Medication adherence appropriate.  -Continued enalapril 2.5 mg.  Written patient instructions provided. Patient verbalized understanding of treatment plan.  Total time in face to face counseling 34 minutes.    Follow-up:  PCP clinic visit in 2 months.  Patient seen with Lily Peer, PharmD, PGY-1 resident.

## 2022-09-16 ENCOUNTER — Other Ambulatory Visit (HOSPITAL_COMMUNITY): Payer: Self-pay

## 2022-09-16 NOTE — Progress Notes (Signed)
Reviewed and agree with Dr Koval's plan.   

## 2022-10-21 ENCOUNTER — Telehealth: Payer: Self-pay | Admitting: Family Medicine

## 2022-10-21 ENCOUNTER — Ambulatory Visit: Payer: Commercial Managed Care - HMO | Admitting: Internal Medicine

## 2022-10-21 NOTE — Progress Notes (Deleted)
[facility]ARE [facility] [address] [place] [address] Dept: [phone] Dept Fax: [phone]  New Patient Office Visit  Subjective:   [name] 1976/01/15 10/21/2022  No chief complaint on file.   HPI: [name] presents today to establish care at [facility]. Introduced to Publishing rights manager role and practice setting.  All questions answered.   Last PCP: Fortunato Curling DO Concerns: See below   Discussed the use of AI scribe software for clinical note transcription with the patient, who gave verbal consent to proceed.  History of Present Illness             The following portions of the patient's history were reviewed and updated as appropriate: past medical history, past surgical history, family history, social history, allergies, medications, and problem list.   Patient Active Problem List   Diagnosis Date Noted   Dyspepsia 12/23/2021   Peeling skin 04/07/2021   Healthcare maintenance 12/22/2018   Hyperlipemia 08/26/2014   Microalbuminuria 08/26/2014   HTN (hypertension) 06/21/2013   T2DM (type 2 diabetes mellitus) (HCC) 07/13/2011   Past Medical History:  Diagnosis Date   DM type 2 (diabetes mellitus, type 2) (HCC) 07/13/2011   Gestational diabetes    With first pregnancy, glyburide   HTN complicating peripregnancy, antepartum, third trimester 10/11/2016   Obesity (BMI 30-39.9)    Pregnancy induced hypertension    Uterine scar from previous cesarean delivery affecting pregnancy 10/11/2016   Past Surgical History:  Procedure Laterality Date   CESAREAN SECTION N/A 04/10/2013   Procedure: CESAREAN SECTION;  Surgeon: Allie Bossier, MD;  Location: [facility];  Service: Obstetrics;  Laterality: N/A;   CESAREAN SECTION N/A 10/11/2016   Procedure: CESAREAN SECTION;  Surgeon: Sherian Rein, MD;  Location[facility];  Service: Obstetrics;  Laterality: N/A;  Odelia Gage, RNFA   PERINEUM  REPAIR N/A 04/10/2013   Procedure: EPISIOTOMY REPAIR;  Surgeon: Allie Bossier, MD;  Location: [facility];  Service: Obstetrics;  Laterality: N/A;   Family History  Problem Relation Age of Onset   Cancer Father    Alcohol abuse Neg Hx    Arthritis Neg Hx    Asthma Neg Hx    Birth defects Neg Hx    COPD Neg Hx    Depression Neg Hx    Diabetes Neg Hx    Drug abuse Neg Hx    Early death Neg Hx    Hearing loss Neg Hx    Heart disease Neg Hx    Hyperlipidemia Neg Hx    Hypertension Neg Hx    Kidney disease Neg Hx    Learning disabilities Neg Hx    Mental illness Neg Hx    Mental retardation Neg Hx    Miscarriages / Stillbirths Neg Hx    Stroke Neg Hx    Vision loss Neg Hx    Varicose Veins Neg Hx    Outpatient Medications Prior to Visit  Medication Sig Dispense Refill   atorvastatin (LIPITOR) 40 MG tablet Take 1 tablet (40 mg total) by mouth daily. 90 tablet 3   blood glucose meter kit and supplies KIT Dispense based on patient and insurance preference. Use up to four times daily as directed. (Patient not taking: Reported on 09/14/2022) 1 each 0   Dulaglutide (TRULICITY) 1.5 MG/0.5ML SOPN Inject 1.5 mg into the skin once a week. 2 mL 6   enalapril (VASOTEC) 2.5 MG tablet TAKE 1 TABLET(2.5 MG) BY MOUTH DAILY 90  tablet 3   FARXIGA 10 MG TABS tablet TAKE 1 TABLET(10 MG) BY MOUTH DAILY BEFORE BREAKFAST 90 tablet 1   Insulin Glargine (BASAGLAR KWIKPEN) 100 UNIT/ML Inject 22 Units into the skin daily. 15 mL 1   insulin lispro (HUMALOG KWIKPEN) 200 UNIT/ML KwikPen Inject 8 Units into the skin daily before supper. 3 mL 11   Insulin Pen Needle (BD PEN NEEDLE NANO 2ND GEN) 32G X 4 MM MISC USE AS DIRECTED TWICE DAILY. 100 each 3   metFORMIN (GLUCOPHAGE-XR) 500 MG 24 hr tablet Take 4 tablets (2,000 mg total) by mouth daily with supper. 180 tablet 3   No facility-administered medications prior to visit.   Not on File  ROS: A complete ROS was performed with pertinent positives/negatives noted in the  HPI. The remainder of the ROS are negative.   Objective:   There were no vitals filed for this visit.  Physical Exam          GENERAL: Well-appearing, in NAD. Well nourished.  SKIN: Pink, warm and dry. No rash, lesion, ulceration, or ecchymoses.  NECK: Trachea midline. Full ROM w/o pain or tenderness. No lymphadenopathy.  RESPIRATORY: Chest wall symmetrical. Respirations even and non-labored. Breath sounds clear to auscultation bilaterally.  CARDIAC: S1, S2 present, regular rate and rhythm. Peripheral pulses 2+ bilaterally.  MSK: Muscle tone and strength appropriate for age. Joints w/o tenderness, redness, or swelling.  EXTREMITIES: Without clubbing, cyanosis, or edema.  NEUROLOGIC: No motor or sensory deficits. Steady, even gait.  PSYCH/MENTAL STATUS: Alert, oriented x 3. Cooperative, appropriate mood and affect.   Health Maintenance Due  Topic Date Due   COVID-19 Vaccine (4 - 2023-24 season) 12/11/2021    No results found for any visits on 10/21/22.     Assessment & Plan:  Assessment and Plan               There are no diagnoses linked to this encounter.   No follow-ups on file.   Of note, portions of this note may have been created with voice recognition software Physicist, medical). While this note has been edited for accuracy, occasional wrong-word or 'sound-a-like' substitutions may have occurred due to the inherent limitations of voice recognition software.  Salvatore Decent, FNP

## 2022-10-21 NOTE — Telephone Encounter (Signed)
Pt called after appt   10/21/22 - Pt called at 10.40am stating she forgot she had an appt today.

## 2022-10-21 NOTE — Telephone Encounter (Signed)
Pt was a no show for a NP appt with Morrie Sheldon on 10/21/22, I did not send a letter and I blocked her from scheduling.

## 2022-12-02 ENCOUNTER — Other Ambulatory Visit: Payer: Self-pay

## 2022-12-02 MED ORDER — ENALAPRIL MALEATE 2.5 MG PO TABS: ORAL_TABLET | ORAL | 3 refills | Status: DC

## 2022-12-12 ENCOUNTER — Other Ambulatory Visit: Payer: Self-pay | Admitting: Family Medicine

## 2022-12-20 ENCOUNTER — Ambulatory Visit: Payer: Self-pay | Admitting: *Deleted

## 2022-12-20 NOTE — Telephone Encounter (Signed)
  Chief Complaint: Shoulder pain, referral Symptoms: Husband calling, pt not present. Calling to ask for referral to specialist, shoulder pain x 4 -5 months. No PCP. Advised to establish care. Then stated "My insurance said we didn't need a referral." Advised to keep appt to establish care, call Insurance to see what ortho MDs will see pts without referral. Advised [facility] for acute need, shoulder pain.  Frequency: 4-5 months Pertinent Negatives: Patient denies  Disposition: [] ED /[x] Urgent Care (no appt availability in office) / [] Appointment(In office/virtual)/ []  [facility]/ [] Home Care/ [] Refused Recommended Disposition /[] [facility]/ []  Follow-up with PCP Additional Notes:  Advised [facility] for acute need. Appt to establish care secured with Dr. Andrey Campanile, PCE. Unable to triage pt. "At work." Reason for Disposition  Health Information question, no triage required and triager able to answer question  Answer Assessment - Initial Assessment Questions 1. REASON FOR CALL or QUESTION: "What is your reason for calling today?" or "How can I best help you?" or "What question do you have that I can help answer?"     Referral  Protocols used: Information Only Call - No Triage-A-AH

## 2022-12-27 ENCOUNTER — Other Ambulatory Visit: Payer: Self-pay | Admitting: Family Medicine

## 2022-12-27 ENCOUNTER — Ambulatory Visit
Admission: RE | Admit: 2022-12-27 | Discharge: 2022-12-27 | Disposition: A | Discharge status: Home / Self Care | Payer: Commercial Managed Care - HMO | Source: Ambulatory Visit | Attending: Family Medicine | Admitting: Family Medicine

## 2022-12-27 DIAGNOSIS — Z1231 Encounter for screening mammogram for malignant neoplasm of breast: Secondary | ICD-10-CM

## 2022-12-28 ENCOUNTER — Other Ambulatory Visit: Payer: Self-pay | Admitting: Family

## 2022-12-31 ENCOUNTER — Other Ambulatory Visit: Payer: Self-pay | Admitting: Family Medicine

## 2022-12-31 DIAGNOSIS — R928 Other abnormal and inconclusive findings on diagnostic imaging of breast: Secondary | ICD-10-CM

## 2023-01-07 ENCOUNTER — Ambulatory Visit
Admission: RE | Admit: 2023-01-07 | Discharge: 2023-01-07 | Disposition: A | Discharge status: Home / Self Care | Payer: Commercial Managed Care - HMO | Source: Ambulatory Visit | Attending: Family Medicine | Admitting: Family Medicine

## 2023-01-07 ENCOUNTER — Ambulatory Visit: Payer: Commercial Managed Care - HMO

## 2023-01-07 DIAGNOSIS — R928 Other abnormal and inconclusive findings on diagnostic imaging of breast: Secondary | ICD-10-CM

## 2023-01-13 ENCOUNTER — Other Ambulatory Visit: Payer: Self-pay | Admitting: Family Medicine

## 2023-01-13 DIAGNOSIS — E119 Type 2 diabetes mellitus without complications: Secondary | ICD-10-CM

## 2023-01-25 ENCOUNTER — Other Ambulatory Visit: Payer: Self-pay

## 2023-01-25 MED ORDER — DAPAGLIFLOZIN PROPANEDIOL 5 MG PO TABS: 10.0000 mg | ORAL_TABLET | Freq: Every day | ORAL | 3 refills | Status: DC

## 2023-02-23 ENCOUNTER — Encounter: Payer: Self-pay | Admitting: Family Medicine

## 2023-02-23 ENCOUNTER — Ambulatory Visit: Payer: Managed Care, Other (non HMO) | Admitting: Family Medicine

## 2023-02-23 VITALS — BP 135/88 | HR 97 | Temp 98.1°F | Resp 16 | Ht 61.0 in | Wt 148.0 lb

## 2023-02-23 DIAGNOSIS — E119 Type 2 diabetes mellitus without complications: Secondary | ICD-10-CM | POA: Diagnosis not present

## 2023-02-23 DIAGNOSIS — Z794 Long term (current) use of insulin: Secondary | ICD-10-CM

## 2023-02-23 DIAGNOSIS — G8929 Other chronic pain: Secondary | ICD-10-CM

## 2023-02-23 DIAGNOSIS — Z7689 Persons encountering health services in other specified circumstances: Secondary | ICD-10-CM

## 2023-02-23 DIAGNOSIS — Z7985 Long-term (current) use of injectable non-insulin antidiabetic drugs: Secondary | ICD-10-CM | POA: Diagnosis not present

## 2023-02-23 DIAGNOSIS — M25512 Pain in left shoulder: Secondary | ICD-10-CM

## 2023-02-23 DIAGNOSIS — Z7984 Long term (current) use of oral hypoglycemic drugs: Secondary | ICD-10-CM

## 2023-02-23 DIAGNOSIS — Z23 Encounter for immunization: Secondary | ICD-10-CM

## 2023-02-23 LAB — POCT GLYCOSYLATED HEMOGLOBIN (HGB A1C): Hemoglobin A1C: 8.9 % — AB (ref 4.0–5.6)

## 2023-02-23 MED ORDER — HUMALOG KWIKPEN 200 UNIT/ML ~~LOC~~ SOPN
8.0000 [IU] | PEN_INJECTOR | Freq: Every day | SUBCUTANEOUS | 11 refills | Status: DC
Start: 2023-02-23 — End: 2023-05-26

## 2023-02-23 MED ORDER — BASAGLAR KWIKPEN 100 UNIT/ML ~~LOC~~ SOPN
PEN_INJECTOR | SUBCUTANEOUS | 1 refills | Status: DC
Start: 2023-02-23 — End: 2023-05-26

## 2023-02-23 MED ORDER — DAPAGLIFLOZIN PROPANEDIOL 10 MG PO TABS: 10.0000 mg | ORAL_TABLET | Freq: Every day | ORAL | 1 refills | Status: DC

## 2023-02-23 MED ORDER — BD PEN NEEDLE NANO 2ND GEN 32G X 4 MM MISC: 3 refills | Status: DC

## 2023-02-23 MED ORDER — FREESTYLE LIBRE 3 PLUS SENSOR MISC: 3 refills | Status: DC

## 2023-03-01 ENCOUNTER — Ambulatory Visit (INDEPENDENT_AMBULATORY_CARE_PROVIDER_SITE_OTHER): Payer: Managed Care, Other (non HMO) | Admitting: Physician Assistant

## 2023-03-01 ENCOUNTER — Encounter: Payer: Self-pay | Admitting: Physician Assistant

## 2023-03-01 ENCOUNTER — Other Ambulatory Visit (INDEPENDENT_AMBULATORY_CARE_PROVIDER_SITE_OTHER): Payer: Self-pay

## 2023-03-01 DIAGNOSIS — M7502 Adhesive capsulitis of left shoulder: Secondary | ICD-10-CM | POA: Diagnosis not present

## 2023-03-01 DIAGNOSIS — G8929 Other chronic pain: Secondary | ICD-10-CM

## 2023-03-01 DIAGNOSIS — M25512 Pain in left shoulder: Secondary | ICD-10-CM

## 2023-03-01 NOTE — Progress Notes (Signed)
New Patient Office Visit  Subjective    Patient ID: [name], female    DOB: November 20, 1975  Age: 47 y.o. MRN: [id number]  CC:  Chief Complaint  Patient presents with   Establish Care    Left shoulder pain, medication refilled, check diabete    HPI [name] presents to establish care and for follow up of chronic med issues including diabetes and chronic left shoulder pain.    Outpatient Encounter Medications as of 02/23/2023  Medication Sig   atorvastatin (LIPITOR) 40 MG tablet TAKE 1 TABLET(40 MG) BY MOUTH DAILY   Continuous Glucose Sensor (FREESTYLE LIBRE 3 PLUS SENSOR) MISC Change sensor every 15 days.   dapagliflozin propanediol (FARXIGA) 10 MG TABS tablet Take 1 tablet (10 mg total) by mouth daily before breakfast.   dapagliflozin propanediol (FARXIGA) 5 MG TABS tablet Take 2 tablets (10 mg total) by mouth daily.   enalapril (VASOTEC) 2.5 MG tablet TAKE 1 TABLET(2.5 MG) BY MOUTH DAILY   metFORMIN (GLUCOPHAGE-XR) 500 MG 24 hr tablet Take 4 tablets (2,000 mg total) by mouth daily with supper.   [DISCONTINUED] Insulin Glargine (BASAGLAR KWIKPEN) 100 UNIT/ML ADMINISTER 22 UNITS UNDER THE SKIN DAILY   [DISCONTINUED] insulin lispro (HUMALOG KWIKPEN) 200 UNIT/ML KwikPen Inject 8 Units into the skin daily before supper.   [DISCONTINUED] Insulin Pen Needle (BD PEN NEEDLE NANO 2ND GEN) 32G X 4 MM MISC USE AS DIRECTED TWICE DAILY.   blood glucose meter kit and supplies KIT Dispense based on patient and insurance preference. Use up to four times daily as directed.   Dulaglutide (TRULICITY) 1.5 MG/0.5ML SOPN Inject 1.5 mg into the skin once a week.   Insulin Glargine (BASAGLAR KWIKPEN) 100 UNIT/ML ADMINISTER 22 UNITS UNDER THE SKIN DAILY   insulin lispro (HUMALOG KWIKPEN) 200 UNIT/ML KwikPen Inject 8 Units into the skin daily before supper.   Insulin Pen Needle (BD PEN NEEDLE NANO 2ND GEN) 32G X 4 MM MISC as directed   No facility-administered encounter medications on file as of 02/23/2023.     Past Medical History:  Diagnosis Date   DM type 2 (diabetes mellitus, type 2) (HCC) 07/13/2011   Gestational diabetes    With first pregnancy, glyburide   HTN complicating peripregnancy, antepartum, third trimester 10/11/2016   Obesity (BMI 30-39.9)    Pregnancy induced hypertension    Uterine scar from previous cesarean delivery affecting pregnancy 10/11/2016    Past Surgical History:  Procedure Laterality Date   CESAREAN SECTION N/A 04/10/2013   Procedure: CESAREAN SECTION;  Surgeon: Allie Bossier, MD;  Location: [facility];  Service: Obstetrics;  Laterality: N/A;   CESAREAN SECTION N/A 10/11/2016   Procedure: CESAREAN SECTION;  Surgeon: Sherian Rein, MD;  [facility];  Service: Obstetrics;  Laterality: N/A;  Odelia Gage, RNFA   PERINEUM REPAIR N/A 04/10/2013   Procedure: EPISIOTOMY REPAIR;  Surgeon: Allie Bossier, MD;  Location: [facility];  Service: Obstetrics;  Laterality: N/A;    Family History  Problem Relation Age of Onset   Cancer Father    Alcohol abuse Neg Hx    Arthritis Neg Hx    Asthma Neg Hx    Birth defects Neg Hx    COPD Neg Hx    Depression Neg Hx    Diabetes Neg Hx    Drug abuse Neg Hx    Early death Neg Hx    Hearing loss Neg Hx    Heart disease Neg Hx    Hyperlipidemia Neg Hx  Hypertension Neg Hx    Kidney disease Neg Hx    Learning disabilities Neg Hx    Mental illness Neg Hx    Mental retardation Neg Hx    Miscarriages / Stillbirths Neg Hx    Stroke Neg Hx    Vision loss Neg Hx    Varicose Veins Neg Hx    Breast cancer Neg Hx     Social History   Socioeconomic History   Marital status: Married    Spouse name: Not on file   Number of children: Not on file   Years of education: Not on file   Highest education level: Not on file  Occupational History   Not on file  Tobacco Use   Smoking status: Never   Smokeless tobacco: Never  Substance and Sexual Activity   Alcohol use: No   Drug use: No   Sexual activity: Yes    Birth  control/protection: None  Other Topics Concern   Not on file  Social History Narrative   Not on file   Social Determinants of Health   Financial Resource Strain: Low Risk  (02/23/2023)   Overall Financial Resource Strain (CARDIA)    Difficulty of Paying Living Expenses: Not hard at all  Food Insecurity: No Food Insecurity (02/23/2023)   Hunger Vital Sign    Worried About Running Out of Food in the Last Year: Never true    Ran Out of Food in the Last Year: Never true  Transportation Needs: Not on file  Physical Activity: Sufficiently Active (02/23/2023)   Exercise Vital Sign    Days of Exercise per Week: 5 days    Minutes of Exercise per Session: 30 min  Stress: No Stress Concern Present (02/23/2023)   [employer] of Occupational Health - Occupational Stress Questionnaire    Feeling of Stress : Not at all  Social Connections: Moderately Isolated (02/23/2023)   Social Connection and Isolation Panel [NHANES]    Frequency of Communication with Friends and Family: More than three times a week    Frequency of Social Gatherings with Friends and Family: More than three times a week    Attends Religious Services: Never    Database administrator or Organizations: No    Attends Banker Meetings: Never    Marital Status: Married  Catering manager Violence: Not At Risk (02/23/2023)   Humiliation, Afraid, Rape, and Kick questionnaire    Fear of Current or Ex-Partner: No    Emotionally Abused: No    Physically Abused: No    Sexually Abused: No    Review of Systems  All other systems reviewed and are negative.       Objective    BP 135/88 (BP Location: Right Arm, Patient Position: Sitting, Cuff Size: Normal)   Pulse 97   Temp 98.1 F (36.7 C) (Oral)   Resp 16   Ht 5\' 1"  (1.549 m)   Wt 148 lb (67.1 kg)   SpO2 99%   BMI 27.96 kg/m   Physical Exam Vitals and nursing note reviewed.  Constitutional:      General: She is not in acute  distress. Cardiovascular:     Rate and Rhythm: Normal rate and regular rhythm.  Pulmonary:     Effort: Pulmonary effort is normal.     Breath sounds: Normal breath sounds.  Abdominal:     Palpations: Abdomen is soft.     Tenderness: There is no abdominal tenderness.  Musculoskeletal:     Left shoulder:  Tenderness present. No swelling or deformity. Decreased range of motion.  Neurological:     General: No focal deficit present.     Mental Status: She is alert and oriented to person, place, and time.         Assessment & Plan:   Type 2 diabetes mellitus without complication, without long-term current use of insulin (HCC) -     POCT glycosylated hemoglobin (Hb A1C) -     HumaLOG KwikPen; Inject 8 Units into the skin daily before supper.  Dispense: 3 mL; Refill: 11  Encounter for immunization -     Flu vaccine trivalent PF, 6mos and older(Flulaval,Afluria,Fluarix,Fluzone)  Chronic left shoulder pain -     Ambulatory referral to Orthopedic Surgery  Diabetes mellitus treated with oral medication (HCC)  Long-term current use of injectable noninsulin antidiabetic medication  Type 2 diabetes mellitus without complication, with long-term current use of insulin (HCC) -     Basaglar KwikPen; ADMINISTER 22 UNITS UNDER THE SKIN DAILY  Dispense: 15 mL; Refill: 1  Encounter to establish care  Other orders -     FreeStyle Libre 3 Plus Sensor; Change sensor every 15 days.  Dispense: 2 each; Refill: 3 -     BD Pen Needle Nano 2nd Gen; as directed  Dispense: 100 each; Refill: 3 -     Dapagliflozin Propanediol; Take 1 tablet (10 mg total) by mouth daily before breakfast.  Dispense: 90 tablet; Refill: 1     Return in about 3 months (around 05/26/2023) for chronic med issues.   Tommie Raymond, MD

## 2023-03-01 NOTE — Addendum Note (Signed)
Addended by: Michaele Offer on: 03/01/2023 04:05 PM   Modules accepted: Orders

## 2023-03-01 NOTE — Progress Notes (Signed)
Office Visit Note   Patient: [name]           Date of Birth: 1975/08/13           MRN: [id number] Visit Date: 03/01/2023              Requested by: Georganna Skeans, MD [address] [place],  [place] [address] PCP: Georganna Skeans, MD   Assessment & Plan: Visit Diagnoses:  1. Chronic pain in left shoulder   2. Adhesive capsulitis of left shoulder     Plan: Patient is a 47 year old woman with a 1 year history of left shoulder pain she describes a fall though she is not a good historian.  She is an uncontrolled diabetic with a hemoglobin A1c most recently of 8.9 but has been as high as 10.  She has had progressive loss of range of motion of her shoulder and it is quite painful.  She also has a history of neck pain and an MRI of her neck did demonstrate multilevel degenerative changes.  She has some numbness in her arm as well.  I do think she has 2 separate problems.  I think the 1 most concerning to her today is her loss of motion and pain in her left shoulder.  She did do physical therapy but she said it did not help at all.  I have recommended an MRI arthrogram and follow-up with Dr. August Saucer.  She does have findings consistent with adhesive capsulitis.  She feels like physical therapy did not help her improve her motion at all may consider a manipulation under anesthesia or an arthroscopy.  Again I think the paresthesias in her arm are probably secondary to her degenerative disc disease in her neck but a secondary problem at this time.  Would not recommend an injection today which may help however with her hemoglobin A1c being so high I would like to avoid this if possible  Follow-Up Instructions: With Dr. August Saucer after MRI  Orders:  Orders Placed This Encounter  Procedures   XR Shoulder Left   Arthrogram   Ambulatory referral to Orthopedic Surgery   No orders of the defined types were placed in this encounter.     Procedures: No procedures performed   Clinical Data: No  additional findings.   Subjective: Chief Complaint  Patient presents with   Left Shoulder - Pain    Pain in left shoulder now for 1 year ordered x rays      HPI patient is a 78 year old woman with a 1 year history of left shoulder pain after a fall.  She said the pains been getting worse for 6 weeks keeps her up at night she also describes some paresthesias in her arm that run down her hand.  She has lost progressive motion of her arm she said she did try physical therapy did not help at all  Review of Systems  All other systems reviewed and are negative.    Objective: Vital Signs: There were no vitals taken for this visit.  Physical Exam Constitutional:      Appearance: Normal appearance.  Pulmonary:     Effort: Pulmonary effort is normal.  Skin:    General: Skin is warm and dry.  Neurological:     General: No focal deficit present.     Mental Status: She is alert and oriented to person, place, and time.  Psychiatric:        Mood and Affect: Mood normal.  Behavior: Behavior normal.    Ortho Exam Examination of the left arm she is neurovascularly intact she has good distal pulses sensation today is intact has no reproduction of symptoms with range of motion of her neck.  She does have limited forward elevation to about 120 degrees.  She has very little external rotation or internal rotation behind her back.   intact.  Her range of motion is both limited passively and actively Specialty Comments:  No specialty comments available.  Imaging: XR Shoulder Left  Result Date: 03/01/2023 Radiographs of her left shoulder demonstrate no acute fractures no significant degenerative changes    PMFS History: Patient Active Problem List   Diagnosis Date Noted   Adhesive capsulitis of left shoulder 03/01/2023   Dyspepsia 12/23/2021   Peeling skin 04/07/2021   Healthcare maintenance 12/22/2018   Hyperlipemia 08/26/2014   Microalbuminuria 08/26/2014   HTN (hypertension)  06/21/2013   Type 2 diabetes mellitus with microalbuminuria, without long-term current use of insulin (HCC) 07/13/2011   Past Medical History:  Diagnosis Date   DM type 2 (diabetes mellitus, type 2) (HCC) 07/13/2011   Gestational diabetes    With first pregnancy, glyburide   HTN complicating peripregnancy, antepartum, third trimester 10/11/2016   Obesity (BMI 30-39.9)    Pregnancy induced hypertension    Uterine scar from previous cesarean delivery affecting pregnancy 10/11/2016    Family History  Problem Relation Age of Onset   Cancer Father    Alcohol abuse Neg Hx    Arthritis Neg Hx    Asthma Neg Hx    Birth defects Neg Hx    COPD Neg Hx    Depression Neg Hx    Diabetes Neg Hx    Drug abuse Neg Hx    Early death Neg Hx    Hearing loss Neg Hx    Heart disease Neg Hx    Hyperlipidemia Neg Hx    Hypertension Neg Hx    Kidney disease Neg Hx    Learning disabilities Neg Hx    Mental illness Neg Hx    Mental retardation Neg Hx    Miscarriages / Stillbirths Neg Hx    Stroke Neg Hx    Vision loss Neg Hx    Varicose Veins Neg Hx    Breast cancer Neg Hx     Past Surgical History:  Procedure Laterality Date   CESAREAN SECTION N/A 04/10/2013   Procedure: CESAREAN SECTION;  Surgeon: Allie Bossier, MD;  Location: [facility];  Service: Obstetrics;  Laterality: N/A;   CESAREAN SECTION N/A 10/11/2016   Procedure: CESAREAN SECTION;  Surgeon: Sherian Rein, MD;  [facility];  Service: Obstetrics;  Laterality: N/A;  Odelia Gage, RNFA   PERINEUM REPAIR N/A 04/10/2013   Procedure: EPISIOTOMY REPAIR;  Surgeon: Allie Bossier, MD;  Location: [facility];  Service: Obstetrics;  Laterality: N/A;   Social History   Occupational History   Not on file  Tobacco Use   Smoking status: Never   Smokeless tobacco: Never  Substance and Sexual Activity   Alcohol use: No   Drug use: No   Sexual activity: Yes    Birth control/protection: None

## 2023-03-08 ENCOUNTER — Telehealth: Payer: Self-pay | Admitting: *Deleted

## 2023-03-08 NOTE — Telephone Encounter (Signed)
error 

## 2023-03-21 ENCOUNTER — Other Ambulatory Visit: Payer: Managed Care, Other (non HMO)

## 2023-03-21 ENCOUNTER — Other Ambulatory Visit: Payer: Self-pay | Admitting: Family Medicine

## 2023-03-21 DIAGNOSIS — E119 Type 2 diabetes mellitus without complications: Secondary | ICD-10-CM

## 2023-03-29 ENCOUNTER — Encounter: Payer: Self-pay | Admitting: Physician Assistant

## 2023-03-30 ENCOUNTER — Encounter: Payer: Self-pay | Admitting: Orthopedic Surgery

## 2023-03-30 ENCOUNTER — Other Ambulatory Visit: Payer: Self-pay

## 2023-03-30 ENCOUNTER — Ambulatory Visit (INDEPENDENT_AMBULATORY_CARE_PROVIDER_SITE_OTHER): Payer: Commercial Managed Care - HMO | Admitting: Orthopedic Surgery

## 2023-03-30 DIAGNOSIS — M25512 Pain in left shoulder: Secondary | ICD-10-CM | POA: Diagnosis not present

## 2023-03-30 DIAGNOSIS — M5412 Radiculopathy, cervical region: Secondary | ICD-10-CM

## 2023-03-30 DIAGNOSIS — G8929 Other chronic pain: Secondary | ICD-10-CM | POA: Diagnosis not present

## 2023-03-30 DIAGNOSIS — M7502 Adhesive capsulitis of left shoulder: Secondary | ICD-10-CM

## 2023-03-30 MED ORDER — LIDOCAINE HCL 1 % IJ SOLN
5.0000 mL | INTRAMUSCULAR | Status: AC | PRN
  Administered 2023-03-30: 5 mL

## 2023-03-30 MED ORDER — METHYLPREDNISOLONE ACETATE 40 MG/ML IJ SUSP
40.0000 mg | INTRAMUSCULAR | Status: AC | PRN
  Administered 2023-03-30: 40 mg via INTRA_ARTICULAR

## 2023-03-30 MED ORDER — BUPIVACAINE HCL 0.5 % IJ SOLN
9.0000 mL | INTRAMUSCULAR | Status: AC | PRN
  Administered 2023-03-30: 9 mL via INTRA_ARTICULAR

## 2023-03-30 NOTE — Progress Notes (Signed)
Office Visit Note   Patient: [name]           Date of Birth: [birth year]           MRN: [id number] Visit Date: 03/30/2023 Requested by: Persons, [place], [place] [address] [place],  [place] [address] PCP: Georganna Skeans, MD  Subjective: Chief Complaint  Patient presents with   Left Shoulder - Pain    HPI: [name] is a 47 y.o. female who presents to the office reporting left shoulder pain and stiffness along with numbness and tingling in the arm.  Symptoms ongoing now for about a year.  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  She is diabetic but her last hemoglobin A1c was 8.1.  She reports numbness and tingling going down both arms.  Shoulder has been stiff and she denies any history of trauma or injury.  Radiographs reviewed unremarkable..                ROS: All systems reviewed are negative as they relate to the chief complaint within the history of present illness.  Patient denies fevers or chills.  Assessment & Plan: Visit Diagnoses:  1. Chronic pain in left shoulder   2. Adhesive capsulitis of left shoulder   3. Radiculopathy, cervical region     Plan: Impression is cervical radiculopathy with paresthesias as well as numbness and tingling affecting the upper trapezial region.  Mild scapular pain as well.  This has been ongoing almost for 6 to 7 months without relief from nonoperative conservative treatment.  MRI cervical spine indicated to evaluate for radiculopathy.  Her more pressing problem is very stiff shoulder.  She has significant limitation of external rotation abduction and forward flexion.  Toradol injection along with Marcaine performed into the joint today.  We will see how she does with that injection and follow-up in 4 weeks with decision for or against manipulation under anesthesia at that time.  Follow-Up Instructions: No follow-ups on file.   Orders:  Orders Placed This Encounter  Procedures   MR Cervical Spine w/o contrast   US Guided Needle Placement - No  Linked Charges   No orders of the defined types were placed in this encounter.     Procedures: Large Joint Inj: L glenohumeral on 03/30/2023 12:45 PM Indications: diagnostic evaluation and pain Details: 22 G 1.5 in needle, ultrasound-guided posterior approach  Arthrogram: No  Medications: 9 mL bupivacaine 0.5 %; 40 mg methylPREDNISolone acetate 40 MG/ML; 5 mL lidocaine 1 % Outcome: tolerated well, no immediate complications Procedure, treatment alternatives, risks and benefits explained, specific risks discussed. Consent was given by the patient. Immediately prior to procedure a time out was called to verify the correct patient, procedure, equipment, support staff and site/side marked as required. Patient was prepped and draped in the usual sterile fashion.       Clinical Data: No additional findings.  Objective: Vital Signs: There were no vitals taken for this visit.  Physical Exam:  Constitutional: Patient appears well-developed HEENT:  Head: Normocephalic Eyes:EOM are normal Neck: Normal range of motion Cardiovascular: Normal rate Pulmonary/chest: Effort normal Neurologic: Patient is alert Skin: Skin is warm Psychiatric: Patient has normal mood and affect  Ortho Exam: Ortho exam demonstrates range of motion on the left of 15/45/85.  Rotator cuff strength intact infraspinatus supraspinatus and subscap muscle testing.  5 out of 5 grip EPL FPL interosseous resection extension bicep tricep and deltoid strength.  Cervical spine range of motion is full.  Does  have some paresthesias up in the [place] region around her neck compared to the right-hand side left versus right.  Deltoid is functional.  No coarse grinding or popping with passive range of motion of that left shoulder.  No other masses lymphadenopathy or skin changes noted in that shoulder girdle region.  Specialty Comments:  No specialty comments available.  Imaging: US Guided Needle Placement - No Linked  Charges Result Date: 03/30/2023 Ultrasound imaging demonstrates needle placement into the left glenohumeral joint with extravasation of fluid into the joint and no complicating features    PMFS History: Patient Active Problem List   Diagnosis Date Noted   Adhesive capsulitis of left shoulder 03/01/2023   Dyspepsia 12/23/2021   Peeling skin 04/07/2021   Healthcare maintenance 12/22/2018   Hyperlipemia 08/26/2014   Microalbuminuria 08/26/2014   HTN (hypertension) 06/21/2013   Type 2 diabetes mellitus with microalbuminuria, without long-term current use of insulin (HCC) 07/13/2011   Past Medical History:  Diagnosis Date   DM type 2 (diabetes mellitus, type 2) (HCC) 07/13/2011   Gestational diabetes    With first pregnancy, glyburide   HTN complicating peripregnancy, antepartum, third trimester 10/11/2016   Obesity (BMI 30-39.9)    Pregnancy induced hypertension    Uterine scar from previous cesarean delivery affecting pregnancy 10/11/2016    Family History  Problem Relation Age of Onset   Cancer Father    Alcohol abuse Neg Hx    Arthritis Neg Hx    Asthma Neg Hx    Birth defects Neg Hx    COPD Neg Hx    Depression Neg Hx    Diabetes Neg Hx    Drug abuse Neg Hx    Early death Neg Hx    Hearing loss Neg Hx    Heart disease Neg Hx    Hyperlipidemia Neg Hx    Hypertension Neg Hx    Kidney disease Neg Hx    Learning disabilities Neg Hx    Mental illness Neg Hx    Mental retardation Neg Hx    Miscarriages / Stillbirths Neg Hx    Stroke Neg Hx    Vision loss Neg Hx    Varicose Veins Neg Hx    Breast cancer Neg Hx     Past Surgical History:  Procedure Laterality Date   CESAREAN SECTION N/A 04/10/2013   Procedure: CESAREAN SECTION;  Surgeon: Allie Bossier, MD;  Location: [facility];  Service: Obstetrics;  Laterality: N/A;   CESAREAN SECTION N/A 10/11/2016   Procedure: CESAREAN SECTION;  Surgeon: Sherian Rein, MD;  Location: [facility];  Service: Obstetrics;   Laterality: N/A;  Odelia Gage, RNFA   PERINEUM REPAIR N/A 04/10/2013   Procedure: EPISIOTOMY REPAIR;  Surgeon: Allie Bossier, MD;  Location: [facility];  Service: Obstetrics;  Laterality: N/A;   Social History   Occupational History   Not on file  Tobacco Use   Smoking status: Never   Smokeless tobacco: Never  Substance and Sexual Activity   Alcohol use: No   Drug use: No   Sexual activity: Yes    Birth control/protection: None

## 2023-04-21 ENCOUNTER — Encounter: Payer: Self-pay | Admitting: Orthopedic Surgery

## 2023-04-29 ENCOUNTER — Ambulatory Visit
Admission: RE | Admit: 2023-04-29 | Discharge: 2023-04-29 | Disposition: A | Discharge status: Home / Self Care | Payer: 59 | Source: Ambulatory Visit | Attending: Orthopedic Surgery | Admitting: Orthopedic Surgery

## 2023-04-29 DIAGNOSIS — M5412 Radiculopathy, cervical region: Secondary | ICD-10-CM

## 2023-04-29 DIAGNOSIS — M5021 Other cervical disc displacement,  high cervical region: Secondary | ICD-10-CM | POA: Diagnosis not present

## 2023-05-18 ENCOUNTER — Ambulatory Visit: Payer: 59 | Admitting: Surgical

## 2023-05-18 DIAGNOSIS — M7502 Adhesive capsulitis of left shoulder: Secondary | ICD-10-CM

## 2023-05-19 ENCOUNTER — Telehealth: Payer: Self-pay | Admitting: Orthopedic Surgery

## 2023-05-19 NOTE — Telephone Encounter (Signed)
 I called and explained the procedure in detail to 1.  [name] can you call him and try to figure out the date.  I think he also wants to try to figure out what his out-of-pocket is going to be.  She will definitely need the shoulder CPM ideally if not she will definitely need a brace.  Thanks

## 2023-05-19 NOTE — Telephone Encounter (Signed)
 Patient's husband Curlee calling to find out if [name] is having shoulder surgery. Patient had MRI follow up yesterday. Curlee could not be with her at the appointment.   Because of the language barrier, Curlee is wanting to be included in the conversation or discussion of surgery to make sure the options of treatment are understood. For planning purposes he will need to know the recovery time for surgery because he may will need to be at home in order for her to recover.   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  Husband Larkin) would like a call from Dr. Addie so he can  explain to [name] the treatment plan and be better prepared to help his wife if surgery is definite.  [name]'s # [phone]

## 2023-05-22 ENCOUNTER — Encounter: Payer: Self-pay | Admitting: Orthopedic Surgery

## 2023-05-22 NOTE — Progress Notes (Signed)
 Office Visit Note   Patient: [name]           Date of Birth: 1975-12-11           MRN: [id number] Visit Date: 05/18/2023 Requested by: Tanda Bleacher, MD [address] [place],  [place] [address] PCP: Tanda Bleacher, MD  Subjective: Chief Complaint  Patient presents with   Other    Review MRI    HPI: [name] is a 48 y.o. female who presents to the office for MRI review. Patient denies any changes in symptoms.  Continues to complain mainly of left shoulder pain with pain in the trapezius region and scapular region.  Most of her pain localizes to the lateral aspect of the shoulder.  She had Toradol  injection at her last visit into the glenohumeral joint that did provide couple days of relief but has not provided lasting relief.  Pain from the lateral aspect of the shoulder will radiate into the mid humerus region.  She works at a chief strategy officer primarily doing pedicures.  MRI results revealed: MR Cervical Spine w/o contrast Result Date: 05/02/2023 CLINICAL DATA:  Initial evaluation for left-sided neck pain with left radicular arm pain. EXAM: MRI CERVICAL SPINE WITHOUT CONTRAST TECHNIQUE: Multiplanar, multisequence MR imaging of the cervical spine was performed. No intravenous contrast was administered. COMPARISON:  None Available. FINDINGS: Alignment: Trace dextroscoliosis with straightening of the normal cervical lordosis. No listhesis. Vertebrae: Vertebral body height maintained without acute or chronic fracture. Diffuse loss of normal bone marrow signal, nonspecific but can be seen with anemia, smoking, obesity, and infiltrative/myelofibrotic marrow processes. No discrete or worrisome osseous lesions. No abnormal marrow edema. Cord: Normal signal and morphology. Posterior Fossa, vertebral arteries, paraspinal tissues: Unremarkable. Disc levels: C2-C3: Unremarkable. C3-C4: Small central disc protrusion minimally indents the ventral thecal sac. Mild bilateral uncovertebral spurring. No  significant spinal stenosis. Foramina remain adequately patent. C4-C5: Tiny right paracentral disc protrusion indents the right ventral thecal sac (series 5, image 14). No significant spinal stenosis. Foramina remain patent. C5-C6: Mild disc bulge with uncovertebral spurring. No significant spinal stenosis. Foramina remain patent. C6-C7: Mild disc bulge. No spinal stenosis. Foramina remain patent. C7-T1: Mild disc bulge. No spinal stenosis. Foramina remain patent. IMPRESSION: 1. Small central disc protrusion at C3-4 without significant stenosis or neural impingement. 2. Tiny right paracentral disc protrusion at C4-5 without significant stenosis or impingement. 3. Mild noncompressive disc bulging at C5-6 through C7-T1 without stenosis or impingement. Electronically Signed   By: Morene Hoard M.D.   On: 05/02/2023 18:03                 ROS: All systems reviewed are negative as they relate to the chief complaint within the history of present illness.  Patient denies fevers or chills.  Assessment & Plan: Visit Diagnoses:  1. Adhesive capsulitis of left shoulder     Plan: [name] is a 48 y.o. female who presents to the office for review of cervical spine MRI.  MRI does demonstrate small disc protrusion at C3-C4 without significant stenosis.  No other pathology to explain left-sided pain coming from the cervical spine.  Seems that most of her symptoms are likely due to her adhesive capsulitis.  When she actively moves her shoulder on exam today she has to continually use her trapezius/motion of the scapulothoracic joint in order to have any above shoulder range of motion.  Think this is likely contributing to her trapezial pain.  We discussed options available to patient.  With her diabetes, cortisone injection is not really an option.  Did discuss manipulation under anesthesia with arthroscopic rotator interval release.  Discussed the risks and benefits of this procedure including the recovery  timeframe.  She will plan to talk this over with her husband and she will call Marval our surgery scheduler if she would like to proceed.  Follow-Up Instructions: No follow-ups on file.   Orders:  No orders of the defined types were placed in this encounter.  No orders of the defined types were placed in this encounter.     Procedures: No procedures performed   Clinical Data: No additional findings.  Objective: Vital Signs: There were no vitals taken for this visit.  Physical Exam:  Constitutional: Patient appears well-developed HEENT:  Head: Normocephalic Eyes:EOM are normal Neck: Normal range of motion Cardiovascular: Normal rate Pulmonary/chest: Effort normal Neurologic: Patient is alert Skin: Skin is warm Psychiatric: Patient has normal mood and affect  Ortho Exam: Ortho exam demonstrates left shoulder with well-preserved rotator cuff strength of supra, infra, subscap rated 5/5.  She has 10 degrees X rotation, 40 degrees abduction, 100 degrees forward elevation passively.  No coarseness or crepitus noted with passive motion of the shoulder.  No tenderness over the [facility] joint.  Does have tenderness over the bicipital groove.  2+ radial pulse of the left upper extremity.  Intact EPL, FPL, finger abduction.  No cellulitis or skin changes noted.  Specialty Comments:  No specialty comments available.  Imaging: No results found.   PMFS History: Patient Active Problem List   Diagnosis Date Noted   Adhesive capsulitis of left shoulder 03/01/2023   Dyspepsia 12/23/2021   Peeling skin 04/07/2021   Healthcare maintenance 12/22/2018   Hyperlipemia 08/26/2014   Microalbuminuria 08/26/2014   HTN (hypertension) 06/21/2013   Type 2 diabetes mellitus with microalbuminuria, without long-term current use of insulin  (HCC) 07/13/2011   Past Medical History:  Diagnosis Date   DM type 2 (diabetes mellitus, type 2) (HCC) 07/13/2011   Gestational diabetes    With first pregnancy,  glyburide    HTN complicating peripregnancy, antepartum, third trimester 10/11/2016   Obesity (BMI 30-39.9)    Pregnancy induced hypertension    Uterine scar from previous cesarean delivery affecting pregnancy 10/11/2016    Family History  Problem Relation Age of Onset   Cancer Father    Alcohol abuse Neg Hx    Arthritis Neg Hx    Asthma Neg Hx    Birth defects Neg Hx    COPD Neg Hx    Depression Neg Hx    Diabetes Neg Hx    Drug abuse Neg Hx    Early death Neg Hx    Hearing loss Neg Hx    Heart disease Neg Hx    Hyperlipidemia Neg Hx    Hypertension Neg Hx    Kidney disease Neg Hx    Learning disabilities Neg Hx    Mental illness Neg Hx    Mental retardation Neg Hx    Miscarriages / Stillbirths Neg Hx    Stroke Neg Hx    Vision loss Neg Hx    Varicose Veins Neg Hx    Breast cancer Neg Hx     Past Surgical History:  Procedure Laterality Date   CESAREAN SECTION N/A 04/10/2013   Procedure: CESAREAN SECTION;  Surgeon: Harland JAYSON Birkenhead, MD;  Location: [facility];  Service: Obstetrics;  Laterality: N/A;   CESAREAN SECTION N/A 10/11/2016   Procedure: CESAREAN SECTION;  Surgeon: Bovard-Stuckert, Jody,  MD;  Location: [facility];  Service: Obstetrics;  Laterality: N/A;  Powell POUR, RNFA   PERINEUM REPAIR N/A 04/10/2013   Procedure: EPISIOTOMY REPAIR;  Surgeon: Harland JAYSON Birkenhead, MD;  Location: [facility];  Service: Obstetrics;  Laterality: N/A;   Social History   Occupational History   Not on file  Tobacco Use   Smoking status: Never   Smokeless tobacco: Never  Substance and Sexual Activity   Alcohol use: No   Drug use: No   Sexual activity: Yes    Birth control/protection: None

## 2023-05-23 NOTE — Telephone Encounter (Signed)
 Done thx I can put in more info if going to [facility] thx

## 2023-05-26 ENCOUNTER — Other Ambulatory Visit: Payer: Self-pay | Admitting: Family Medicine

## 2023-05-26 ENCOUNTER — Encounter: Payer: Self-pay | Admitting: Family Medicine

## 2023-05-26 ENCOUNTER — Ambulatory Visit: Payer: 59 | Admitting: Family Medicine

## 2023-05-26 VITALS — BP 136/84 | HR 92 | Temp 97.9°F | Resp 16 | Ht 61.0 in | Wt 145.6 lb

## 2023-05-26 DIAGNOSIS — Z794 Long term (current) use of insulin: Secondary | ICD-10-CM

## 2023-05-26 DIAGNOSIS — I10 Essential (primary) hypertension: Secondary | ICD-10-CM | POA: Diagnosis not present

## 2023-05-26 DIAGNOSIS — E119 Type 2 diabetes mellitus without complications: Secondary | ICD-10-CM

## 2023-05-26 DIAGNOSIS — Z7984 Long term (current) use of oral hypoglycemic drugs: Secondary | ICD-10-CM

## 2023-05-26 DIAGNOSIS — Z7985 Long-term (current) use of injectable non-insulin antidiabetic drugs: Secondary | ICD-10-CM

## 2023-05-26 DIAGNOSIS — E782 Mixed hyperlipidemia: Secondary | ICD-10-CM

## 2023-05-26 LAB — POCT GLYCOSYLATED HEMOGLOBIN (HGB A1C): Hemoglobin A1C: 8.9 % — AB (ref 4.0–5.6)

## 2023-05-26 MED ORDER — FREESTYLE LIBRE 3 PLUS SENSOR MISC: 3 refills | Status: DC

## 2023-05-26 MED ORDER — BASAGLAR KWIKPEN 100 UNIT/ML ~~LOC~~ SOPN
PEN_INJECTOR | SUBCUTANEOUS | 1 refills | Status: DC
Start: 2023-05-26 — End: 2023-05-27

## 2023-05-26 MED ORDER — HUMALOG KWIKPEN 200 UNIT/ML ~~LOC~~ SOPN: 8.0000 [IU] | PEN_INJECTOR | Freq: Every day | SUBCUTANEOUS | 11 refills | Status: DC

## 2023-05-26 MED ORDER — BD PEN NEEDLE NANO 2ND GEN 32G X 4 MM MISC: 3 refills | Status: DC

## 2023-05-26 MED ORDER — TRULICITY 1.5 MG/0.5ML ~~LOC~~ SOAJ: 1.5000 mg | SUBCUTANEOUS | 6 refills | Status: DC

## 2023-05-26 NOTE — Telephone Encounter (Signed)
Requested medication (s) are due for refill today: na   Requested medication (s) are on the active medication list: yes   Last refill:  05/26/23 # 2 each 3 refills  Future visit scheduled: no   Notes to clinic:  no protocol will attach.   Pharmacy comment: Alternative Requested:NOT COVERED.       Requested Prescriptions  Pending Prescriptions Disp Refills   Continuous Glucose Sensor (FREESTYLE LIBRE 3 PLUS SENSOR) MISC [Pharmacy Med Name: FREESTYLE LIBRE 3 PLUS SENSOR] 2 each 3    Sig: CHANGE SENSOR EVERY 15 DAYS.     There is no refill protocol information for this order

## 2023-05-27 ENCOUNTER — Other Ambulatory Visit: Payer: Self-pay | Admitting: Family Medicine

## 2023-05-27 ENCOUNTER — Ambulatory Visit: Payer: 59 | Attending: Family Medicine | Admitting: Pharmacist

## 2023-05-27 ENCOUNTER — Encounter: Payer: Self-pay | Admitting: Family Medicine

## 2023-05-27 ENCOUNTER — Encounter: Payer: Self-pay | Admitting: Pharmacist

## 2023-05-27 ENCOUNTER — Other Ambulatory Visit: Payer: Self-pay | Admitting: Emergency Medicine

## 2023-05-27 ENCOUNTER — Telehealth: Payer: Self-pay | Admitting: Pharmacist

## 2023-05-27 DIAGNOSIS — Z7985 Long-term (current) use of injectable non-insulin antidiabetic drugs: Secondary | ICD-10-CM

## 2023-05-27 DIAGNOSIS — E119 Type 2 diabetes mellitus without complications: Secondary | ICD-10-CM | POA: Diagnosis not present

## 2023-05-27 DIAGNOSIS — Z7984 Long term (current) use of oral hypoglycemic drugs: Secondary | ICD-10-CM | POA: Diagnosis not present

## 2023-05-27 DIAGNOSIS — Z794 Long term (current) use of insulin: Secondary | ICD-10-CM

## 2023-05-27 LAB — MICROALBUMIN / CREATININE URINE RATIO
Creatinine, Urine: 34.2 mg/dL
Microalb/Creat Ratio: 573 mg/g{creat} — ABNORMAL HIGH (ref 0–29)
Microalbumin, Urine: 195.9 ug/mL

## 2023-05-27 MED ORDER — BASAGLAR KWIKPEN 100 UNIT/ML ~~LOC~~ SOPN: 22.0000 [IU] | PEN_INJECTOR | Freq: Every day | SUBCUTANEOUS | 2 refills | Status: DC

## 2023-05-27 MED ORDER — LEVEMIR FLEXPEN 100 UNIT/ML ~~LOC~~ SOPN
PEN_INJECTOR | SUBCUTANEOUS | 0 refills | Status: DC
Start: 2023-05-27 — End: 2023-05-27

## 2023-05-27 MED ORDER — DAPAGLIFLOZIN PROPANEDIOL 10 MG PO TABS: 10.0000 mg | ORAL_TABLET | Freq: Every day | ORAL | 1 refills | Status: DC

## 2023-05-27 MED ORDER — NOVOLOG FLEXPEN 100 UNIT/ML ~~LOC~~ SOPN: 8.0000 [IU] | PEN_INJECTOR | Freq: Every day | SUBCUTANEOUS | 1 refills | Status: DC

## 2023-05-27 MED ORDER — ATORVASTATIN CALCIUM 40 MG PO TABS: 40.0000 mg | ORAL_TABLET | Freq: Every day | ORAL | 3 refills | Status: DC

## 2023-05-27 MED ORDER — ENALAPRIL MALEATE 2.5 MG PO TABS: ORAL_TABLET | ORAL | 3 refills | Status: DC

## 2023-05-27 MED ORDER — METFORMIN HCL ER 500 MG PO TB24: 1000.0000 mg | ORAL_TABLET | Freq: Two times a day (BID) | ORAL | 2 refills | Status: DC

## 2023-05-27 MED ORDER — BD PEN NEEDLE NANO 2ND GEN 32G X 4 MM MISC: 3 refills | Status: DC

## 2023-05-27 NOTE — Telephone Encounter (Signed)
Hey friend. Pt is on Trulicity currently but cannot tolerate due to an injection site reaction. She reports good results with Ozempic in the past but tells me this is no longer covered. Can we attempt a PA for the Ozempic and also her Libre 3+? She is on basal + bolus insulin in addition to Ozempic.

## 2023-05-27 NOTE — Progress Notes (Signed)
Established Patient Office Visit  Subjective    Patient ID: [name], female    DOB: Mar 28, 1976  Age: 48 y.o. MRN: [id number]  CC:  Chief Complaint  Patient presents with   Follow-up    3 month, medication refill    HPI Kenetra Hildenbrand presents for routine follow up of chronic med issues. Patient reports med compliance and denies acute complaints.   Outpatient Encounter Medications as of 05/26/2023  Medication Sig   atorvastatin (LIPITOR) 40 MG tablet TAKE 1 TABLET(40 MG) BY MOUTH DAILY   blood glucose meter kit and supplies KIT Dispense based on patient and insurance preference. Use up to four times daily as directed.   dapagliflozin propanediol (FARXIGA) 10 MG TABS tablet Take 1 tablet (10 mg total) by mouth daily before breakfast.   dapagliflozin propanediol (FARXIGA) 5 MG TABS tablet Take 2 tablets (10 mg total) by mouth daily.   enalapril (VASOTEC) 2.5 MG tablet TAKE 1 TABLET(2.5 MG) BY MOUTH DAILY   metFORMIN (GLUCOPHAGE-XR) 500 MG 24 hr tablet TAKE 4 TABLETS(2000 MG) BY MOUTH DAILY WITH SUPPER   [DISCONTINUED] Continuous Glucose Sensor (FREESTYLE LIBRE 3 PLUS SENSOR) MISC Change sensor every 15 days.   [DISCONTINUED] Dulaglutide (TRULICITY) 1.5 MG/0.5ML SOPN Inject 1.5 mg into the skin once a week.   [DISCONTINUED] Insulin Glargine (BASAGLAR KWIKPEN) 100 UNIT/ML ADMINISTER 22 UNITS UNDER THE SKIN DAILY   [DISCONTINUED] insulin lispro (HUMALOG KWIKPEN) 200 UNIT/ML KwikPen Inject 8 Units into the skin daily before supper.   [DISCONTINUED] Insulin Pen Needle (BD PEN NEEDLE NANO 2ND GEN) 32G X 4 MM MISC as directed   Dulaglutide (TRULICITY) 1.5 MG/0.5ML SOAJ Inject 1.5 mg into the skin once a week.   Insulin Glargine (BASAGLAR KWIKPEN) 100 UNIT/ML ADMINISTER 22 UNITS UNDER THE SKIN DAILY   insulin lispro (HUMALOG KWIKPEN) 200 UNIT/ML KwikPen Inject 8 Units into the skin daily before supper.   Insulin Pen Needle (BD PEN NEEDLE NANO 2ND GEN) 32G X 4 MM MISC as directed   [DISCONTINUED]  Continuous Glucose Sensor (FREESTYLE LIBRE 3 PLUS SENSOR) MISC Change sensor every 15 days.   No facility-administered encounter medications on file as of 05/26/2023.    Past Medical History:  Diagnosis Date   DM type 2 (diabetes mellitus, type 2) (HCC) 07/13/2011   Gestational diabetes    With first pregnancy, glyburide   HTN complicating peripregnancy, antepartum, third trimester 10/11/2016   Obesity (BMI 30-39.9)    Pregnancy induced hypertension    Uterine scar from previous cesarean delivery affecting pregnancy 10/11/2016    Past Surgical History:  Procedure Laterality Date   CESAREAN SECTION N/A 04/10/2013   Procedure: CESAREAN SECTION;  Surgeon: Allie Bossier, MD;  Location: [facility];  Service: Obstetrics;  Laterality: N/A;   CESAREAN SECTION N/A 10/11/2016   Procedure: CESAREAN SECTION;  Surgeon: Sherian Rein, MD;  [facility];  Service: Obstetrics;  Laterality: N/A;  Odelia Gage, RNFA   PERINEUM REPAIR N/A 04/10/2013   Procedure: EPISIOTOMY REPAIR;  Surgeon: Allie Bossier, MD;  Location: [facility];  Service: Obstetrics;  Laterality: N/A;    Family History  Problem Relation Age of Onset   Cancer Father    Alcohol abuse Neg Hx    Arthritis Neg Hx    Asthma Neg Hx    Birth defects Neg Hx    COPD Neg Hx    Depression Neg Hx    Diabetes Neg Hx    Drug abuse Neg Hx    Early death  Neg Hx    Hearing loss Neg Hx    Heart disease Neg Hx    Hyperlipidemia Neg Hx    Hypertension Neg Hx    Kidney disease Neg Hx    Learning disabilities Neg Hx    Mental illness Neg Hx    Mental retardation Neg Hx    Miscarriages / Stillbirths Neg Hx    Stroke Neg Hx    Vision loss Neg Hx    Varicose Veins Neg Hx    Breast cancer Neg Hx     Social History   Socioeconomic History   Marital status: Married    Spouse name: Not on file   Number of children: Not on file   Years of education: Not on file   Highest education level: Not on file  Occupational History   Not on file   Tobacco Use   Smoking status: Never   Smokeless tobacco: Never  Substance and Sexual Activity   Alcohol use: No   Drug use: No   Sexual activity: Yes    Birth control/protection: None  Other Topics Concern   Not on file  Social History Narrative   Not on file   Social Drivers of Health   Financial Resource Strain: Low Risk  (02/23/2023)   Overall Financial Resource Strain (CARDIA)    Difficulty of Paying Living Expenses: Not hard at all  Food Insecurity: No Food Insecurity (02/23/2023)   Hunger Vital Sign    Worried About Running Out of Food in the Last Year: Never true    Ran Out of Food in the Last Year: Never true  Transportation Needs: Not on file  Physical Activity: Sufficiently Active (02/23/2023)   Exercise Vital Sign    Days of Exercise per Week: 5 days    Minutes of Exercise per Session: 30 min  Stress: No Stress Concern Present (02/23/2023)   [employer] of Occupational Health - Occupational Stress Questionnaire    Feeling of Stress : Not at all  Social Connections: Moderately Isolated (02/23/2023)   Social Connection and Isolation Panel [NHANES]    Frequency of Communication with Friends and Family: More than three times a week    Frequency of Social Gatherings with Friends and Family: More than three times a week    Attends Religious Services: Never    Database administrator or Organizations: No    Attends Banker Meetings: Never    Marital Status: Married  Catering manager Violence: Not At Risk (02/23/2023)   Humiliation, Afraid, Rape, and Kick questionnaire    Fear of Current or Ex-Partner: No    Emotionally Abused: No    Physically Abused: No    Sexually Abused: No    Review of Systems  All other systems reviewed and are negative.       Objective    BP 136/84 (BP Location: Right Arm, Patient Position: Sitting, Cuff Size: Normal)   Pulse 92   Temp 97.9 F (36.6 C) (Oral)   Resp 16   Ht 5\' 1"  (1.549 m)   Wt 145 lb 9.6 oz  (66 kg)   SpO2 99%   BMI 27.51 kg/m   Physical Exam Vitals and nursing note reviewed.  Constitutional:      General: She is not in acute distress. Cardiovascular:     Rate and Rhythm: Normal rate and regular rhythm.  Pulmonary:     Effort: Pulmonary effort is normal.     Breath sounds: Normal breath sounds.  Abdominal:     Palpations: Abdomen is soft.     Tenderness: There is no abdominal tenderness.  Neurological:     General: No focal deficit present.     Mental Status: She is alert and oriented to person, place, and time.         Assessment & Plan:   Diabetes mellitus treated with oral medication (HCC) -     Microalbumin / creatinine urine ratio -     POCT glycosylated hemoglobin (Hb A1C)  Primary hypertension  Type 2 diabetes mellitus without complication, with long-term current use of insulin (HCC) -     Basaglar KwikPen; ADMINISTER 22 UNITS UNDER THE SKIN DAILY  Dispense: 15 mL; Refill: 1  Type 2 diabetes mellitus without complication, without long-term current use of insulin (HCC) -     HumaLOG KwikPen; Inject 8 Units into the skin daily before supper.  Dispense: 3 mL; Refill: 11 -     Trulicity; Inject 1.5 mg into the skin once a week.  Dispense: 2 mL; Refill: 6  Long-term current use of injectable noninsulin antidiabetic medication  Mixed hyperlipidemia  Other orders -     BD Pen Needle Nano 2nd Gen; as directed  Dispense: 100 each; Refill: 3     Return in about 3 months (around 08/23/2023) for follow up, chronic med issues.   Tommie Raymond, MD

## 2023-05-27 NOTE — Telephone Encounter (Signed)
Will forward to correct nurse

## 2023-05-27 NOTE — Progress Notes (Signed)
S:     No chief complaint on file.  48 y.o. female who presents for diabetes evaluation, education, and management. Patient arrives in  good spirits and presents without any assistance.   Patient was referred and last seen by Primary Care Provider, Dr. Andrey Campanile, on 05/26/2023.    PMH is significant for T2DM with microalbuminuria, HTN, HLD. Of note, she has no known or documented hx of clinical ASCVD, CHF, or CKD. No hx of pancreatitis or thyroid cancer. DM is longstanding and she was previously managed by [facility] before transferring care to [facility] with Dr. Andrey Campanile. She has been on GLP-1 RA therapy with Ozempic prior to starting Trulicity. She prefers the Ozempic as Trulicity administration causes injection site pain. She is adherent to basal+bolus insulin, metformin, and [place]. She has tolerated GLP-1 RA therapy well and denies any side effects to her current medications. She was using CGM but ran out of sensors and has not been checking sugar levels at home. She is asymptomatic at this time and denies any hypoglycemia.    Family/Social History:  -Fhx: no pertinent positives -Never smoker -No alcohol intake reported   Current diabetes medications include: Basaglar 22u in the morning, Humalog 8u before she eats supper, Farxiga 10 mg daily, metformin 1000 mg XR daily (takes two 500 mg tablets BID), Trulicity (stopped d/t injection site pain)  Current hypertension medications include: atorvastatin 40 mg daily Current hyperlipidemia medications include: enalapril 2.5 mg daily  Patient reports adherence to taking all medications as prescribed with the exception of Trulicity.   Insurance coverage: [employer]  Patient denies hypoglycemic events.  Reported home fasting blood sugars: not checking  Reported 2 hour post-meal/random blood sugars: not checking.  Patient denies nocturia (nighttime urination).  Patient denies neuropathy (nerve pain). Patient denies visual changes. Patient reports  self foot exams.   Patient reported dietary habits:  -Admits to dietary indiscretion and names rice and bread as starchy foods she eats daily  Patient-reported exercise habits: none   O:  No GM or CGM in place.   Lab Results  Component Value Date   HGBA1C 8.9 (A) 05/26/2023   There were no vitals filed for this visit.  Lipid Panel     Component Value Date/Time   CHOL 103 06/30/2022 1212   TRIG 94 06/30/2022 1212   HDL 49 06/30/2022 1212   CHOLHDL 2.1 06/30/2022 1212   CHOLHDL 4.7 07/11/2015 0906   VLDL 44 (H) 07/11/2015 0906   LDLCALC 36 06/30/2022 1212    Clinical Atherosclerotic Cardiovascular Disease (ASCVD): No  The ASCVD Risk score (Arnett DK, et al., 2019) failed to calculate for the following reasons:   The valid total cholesterol range is 130 to 320 mg/dL   Patient is participating in a Managed Medicaid Plan: No   A/P: Diabetes longstanding currently uncontrolled. Patient is able to verbalize appropriate hypoglycemia management plan. She is asymptomatic from a hypo- or hyperglycemia standpoint. Medication adherence appears to be okay, however, she has stopped Trulicity d/t injection site pain. I will work with Tresa Endo in pharmacy to see if we can get PA approval for Ozempic.  -Continued Basaglar 22u daily.  -Continued bolus insulin. Change to Novolog per insurance preference at [facility] before supper.  -Continued Farxiga 10 mg daily.  -Restart Ozempic. We will pursue PA approval for this.  -Refills sent for CGM (Libre 3 plus). -Extensively discussed pathophysiology of diabetes, recommended lifestyle interventions, dietary effects on blood sugar control.  -Counseled on s/sx of and  management of hypoglycemia.  -Next A1c anticipated 08/2023.   ASCVD risk - primary prevention in patient with diabetes. Last LDL is at goal of <70 mg/dL. Current management appropriate. Will need updated lipid next month.  -Continued atorvastatin 40 mg.   Hypertension longstanding currently  close to goall with yesterday's clinic BP of 136/84 mmhg. Blood pressure goal of <130/80 mmHg. Medication adherence reported.  -Continued enalapril 2.5 mg daily for now.   Written patient instructions provided. Patient verbalized understanding of treatment plan.  Total time in face to face counseling 30 minutes.    Follow-up:  Pharmacist in 1 month  Butch Penny, PharmD, [place], CPP Clinical Pharmacist [facility] [phone]

## 2023-05-28 ENCOUNTER — Other Ambulatory Visit: Payer: Self-pay | Admitting: Family Medicine

## 2023-05-30 ENCOUNTER — Other Ambulatory Visit: Payer: Self-pay

## 2023-05-30 ENCOUNTER — Telehealth: Payer: Self-pay | Admitting: Pharmacist

## 2023-05-30 ENCOUNTER — Other Ambulatory Visit: Payer: Self-pay | Admitting: Pharmacist

## 2023-05-30 MED ORDER — LIRAGLUTIDE 18 MG/3ML ~~LOC~~ SOPN: PEN_INJECTOR | SUBCUTANEOUS | 1 refills | Status: DC

## 2023-05-30 NOTE — Telephone Encounter (Signed)
Requested medication (s) are due for refill today: na   Requested medication (s) are on the active medication list: yes   Last refill:  05/27/23 #90 1 refills   Future visit scheduled: yes in 2 months   Notes to clinic:  Pharmacy comment: Script Clarification:NOT COVERED.      Requested Prescriptions  Pending Prescriptions Disp Refills   FARXIGA 10 MG TABS tablet [Pharmacy Med Name: FARXIGA 10 MG TABLET] 90 tablet 1    Sig: TAKE 1 TABLET BY MOUTH DAILY BEFORE BREAKFAST.     Endocrinology:  Diabetes - SGLT2 Inhibitors Failed - 05/30/2023 12:43 PM      Failed - HBA1C is between 0 and 7.9 and within 180 days    Hemoglobin A1C  Date Value Ref Range Status  05/26/2023 8.9 (A) 4.0 - 5.6 % Final   HbA1c, POC (controlled diabetic range)  Date Value Ref Range Status  08/05/2022 9.3 (A) 0.0 - 7.0 % Final         Passed - Cr in normal range and within 360 days    Creat  Date Value Ref Range Status  12/16/2015 0.60 0.50 - 1.10 mg/dL Final   Creatinine, Ser  Date Value Ref Range Status  06/30/2022 0.60 0.57 - 1.00 mg/dL Final   Creatinine, Urine  Date Value Ref Range Status  03/18/2014 189.5 mg/dL Final    Comment:    No reference range established.         Passed - eGFR in normal range and within 360 days    GFR, Est African American  Date Value Ref Range Status  12/16/2015 >89 >=60 mL/min Final   GFR calc Af Amer  Date Value Ref Range Status  07/14/2017 129 >59 mL/min/1.73 Final   GFR, Est Non African American  Date Value Ref Range Status  12/16/2015 >89 >=60 mL/min Final   GFR calc non Af Amer  Date Value Ref Range Status  07/14/2017 112 >59 mL/min/1.73 Final   eGFR  Date Value Ref Range Status  06/30/2022 113 >59 mL/min/1.73 Final         Passed - Valid encounter within last 6 months    Recent Outpatient Visits           3 days ago Type 2 diabetes mellitus without complication, with long-term current use of insulin (HCC)   [facility] [place]. [facility] Lois Huxley, [place] L, RPH-CPP   4 days ago Diabetes mellitus treated with oral medication [facility])   [place] [facility] at [facility], MD   3 months ago Type 2 diabetes mellitus without complication, without long-term current use of insulin [facility])   [place] [facility], MD   10 years ago Elevated liver enzymes   Primary Care at [facility], Dema Severin, PA-C   10 years ago Amenorrhea   Primary Care at [facility], Rachelle Hora, DO       Future Appointments             In 1 month Lois Huxley, Cornelius Moras, RPH-CPP  [facility] [place]. [facility]   In 2 months Georganna Skeans, MD [facility] Primary [facility] at [facility]

## 2023-05-30 NOTE — Telephone Encounter (Signed)
Requested medication (s) are due for refill today: yes  Requested medication (s) are on the active medication list: yes  Last refill:  05/27/23 #100 3 refills  Future visit scheduled: yes in 2 months  Notes to clinic:  Pharmacy comment: Script Clarification:NEED FREQUENCY TO CALCULATE DAY SUPPLY FOR INSURANCE.      Requested Prescriptions  Pending Prescriptions Disp Refills   BD PEN NEEDLE NANO 2ND GEN 32G X 4 MM MISC [Pharmacy Med Name: BD NANO 2 GEN PEN NDL 32G 4MM] 100 each 3    Sig: AS DIRECTED     Endocrinology: Diabetes - Testing Supplies Passed - 05/30/2023 10:34 AM      Passed - Valid encounter within last 12 months    Recent Outpatient Visits           3 days ago Type 2 diabetes mellitus without complication, with long-term current use of insulin (HCC)   [place] [facility] [place]. [facility] Lois Huxley, [place] L, RPH-CPP   4 days ago Diabetes mellitus treated with oral medication [facility])   [place] [facility] at [facility], MD   3 months ago Type 2 diabetes mellitus without complication, without long-term current use of insulin [facility])   [place] [facility] at [facility], MD   10 years ago Elevated liver enzymes   Primary Care at [facility], Dema Severin, PA-C   10 years ago Amenorrhea   Primary Care at [facility], Rachelle Hora, DO       Future Appointments             In 1 month Lois Huxley, Cornelius Moras, RPH-CPP [place] [facility] Of [place]. [facility]   In 2 months Georganna Skeans, MD [facility] at [facility]

## 2023-05-30 NOTE — Telephone Encounter (Signed)
Hey friend -   I am so sorry to bother you. Can we also start a PA for this patient's [place]. She is waiting to hear back on the Dietrich and she tells me today the pharmacy is not giving her the [place].

## 2023-05-31 ENCOUNTER — Other Ambulatory Visit: Payer: Self-pay

## 2023-06-01 ENCOUNTER — Other Ambulatory Visit: Payer: Self-pay | Admitting: Pharmacist

## 2023-06-01 ENCOUNTER — Other Ambulatory Visit: Payer: Self-pay

## 2023-06-01 MED ORDER — EMPAGLIFLOZIN 10 MG PO TABS: 10.0000 mg | ORAL_TABLET | Freq: Every day | ORAL | 1 refills | Status: DC

## 2023-06-02 ENCOUNTER — Other Ambulatory Visit: Payer: Self-pay

## 2023-07-01 ENCOUNTER — Ambulatory Visit: Payer: 59 | Admitting: Pharmacist

## 2023-07-22 ENCOUNTER — Other Ambulatory Visit: Payer: Self-pay | Admitting: Family Medicine

## 2023-07-22 MED ORDER — LIRAGLUTIDE 18 MG/3ML ~~LOC~~ SOPN: 1.8000 mg | PEN_INJECTOR | Freq: Every day | SUBCUTANEOUS | 2 refills | Status: DC

## 2023-08-01 NOTE — Pre-Procedure Instructions (Addendum)
 Surgical Instructions   Your procedure is scheduled on August 04, 2023. Report to [facility] Main Entrance "A" at 1:15 P.M., then check in with the Admitting office. Any questions or running late day of surgery: call [phone]  Questions prior to your surgery date: call [phone], Monday-Friday, 8am-4pm. If you experience any cold or flu symptoms such as cough, fever, chills, shortness of breath, etc. between now and your scheduled surgery, please notify us  at the above number.     Remember:  Do not eat after midnight the night before your surgery   You may drink clear liquids until 12:15 PM the afternoon of your surgery.   Clear liquids allowed are: Water, Non-Citrus Juices (without pulp), Carbonated Beverages, Clear Tea (no milk, honey, etc.), Black Coffee Only (NO MILK, CREAM OR POWDERED CREAMER of any kind), and Gatorade.  Patient Instructions  The night before surgery:  No food after midnight. ONLY clear liquids after midnight  The day of surgery (if you have diabetes): Drink ONE (1) 12 oz G2 given to you in your pre admission testing appointment by 12:15 PM the afternoon of surgery. Drink in one sitting. Do not sip.  This drink was given to you during your hospital  pre-op appointment visit.  Nothing else to drink after completing the  12 oz bottle of G2.         If you have questions, please contact your surgeon's office.    Take these medicines the morning of surgery with A SIP OF WATER: atorvastatin  (LIPITOR)    One week prior to surgery, STOP taking any Aspirin (unless otherwise instructed by your surgeon) Aleve, Naproxen, Ibuprofen , Motrin , Advil , Goody's, BC's, all herbal medications, fish oil, and non-prescription vitamins.   WHAT DO I DO ABOUT MY DIABETES MEDICATION?   Do not take metFORMIN  (GLUCOPHAGE -XR) the morning of surgery.  THE NIGHT BEFORE SURGERY, do not take your bedtime dose of insulin  lispro (HUMALOG  KWIKPEN).  THE NIGHT BEFORE SURGERY/THE  MORNING OF SURGERY, take 11 units of Insulin  Glargine (BASAGLAR  KWIKPEN).  STOP taking your empagliflozin  (JARDIANCE ) three days prior to surgery. Your last dose will be April 20th.  STOP taking your liraglutide  (VICTOZA ) 24 hours prior to surgery. DO NOT take any doses after 12:00 PM on April 23rd.  If your CBG is greater than 220 mg/dL, you may take 4 units of your insulin  lispro (HUMALOG  KWIKPEN).   HOW TO MANAGE YOUR DIABETES BEFORE AND AFTER SURGERY  Why is it important to control my blood sugar before and after surgery? Improving blood sugar levels before and after surgery helps healing and can limit problems. A way of improving blood sugar control is eating a healthy diet by:  Eating less sugar and carbohydrates  Increasing activity/exercise  Talking with your doctor about reaching your blood sugar goals High blood sugars (greater than 180 mg/dL) can raise your risk of infections and slow your recovery, so you will need to focus on controlling your diabetes during the weeks before surgery. Make sure that the doctor who takes care of your diabetes knows about your planned surgery including the date and location.  How do I manage my blood sugar before surgery? Check your blood sugar at least 4 times a day, starting 2 days before surgery, to make sure that the level is not too high or low.  Check your blood sugar the morning of your surgery when you wake up and every 2 hours until you get to the Short Stay unit.  If your  blood sugar is less than 70 mg/dL, you will need to treat for low blood sugar: Do not take insulin . Treat a low blood sugar (less than 70 mg/dL) with  cup of clear juice (cranberry or apple), 4 glucose tablets, OR glucose gel. Recheck blood sugar in 15 minutes after treatment (to make sure it is greater than 70 mg/dL). If your blood sugar is not greater than 70 mg/dL on recheck, call [phone] for further instructions. Report your blood sugar to the short stay  nurse when you get to Short Stay.  If you are admitted to the hospital after surgery: Your blood sugar will be checked by the staff and you will probably be given insulin  after surgery (instead of oral diabetes medicines) to make sure you have good blood sugar levels. The goal for blood sugar control after surgery is 80-180 mg/dL.                      Do NOT Smoke (Tobacco/Vaping) for 24 hours prior to your procedure.  If you use a CPAP at night, you may bring your mask/headgear for your overnight stay.   You will be asked to remove any contacts, glasses, piercing's, hearing aid's, dentures/partials prior to surgery. Please bring cases for these items if needed.    Patients discharged the day of surgery will not be allowed to drive home, and someone needs to stay with them for 24 hours.  SURGICAL WAITING ROOM VISITATION Patients may have no more than 2 support people in the waiting area - these visitors may rotate.   Pre-op nurse will coordinate an appropriate time for 1 ADULT support person, who may not rotate, to accompany patient in pre-op.  Children under the age of 28 must have an adult with them who is not the patient and must remain in the main waiting area with an adult.  If the patient needs to stay at the hospital during part of their recovery, the visitor guidelines for inpatient rooms apply.  Please refer to the [facility] website for the visitor guidelines for any additional information.   If you received a COVID test during your pre-op visit  it is requested that you wear a mask when out in public, stay away from anyone that may not be feeling well and notify your surgeon if you develop symptoms. If you have been in contact with anyone that has tested positive in the last 10 days please notify you surgeon.      Pre-operative CHG Bathing Instructions   You can play a key role in reducing the risk of infection after surgery. Your skin needs to be as free of germs as  possible. You can reduce the number of germs on your skin by washing with CHG (chlorhexidine  gluconate) soap before surgery. CHG is an antiseptic soap that kills germs and continues to kill germs even after washing.   DO NOT use if you have an allergy to chlorhexidine /CHG or antibacterial soaps. If your skin becomes reddened or irritated, stop using the CHG and notify one of our RNs at [phone].              TAKE A SHOWER THE NIGHT BEFORE SURGERY AND THE DAY OF SURGERY    Please keep in mind the following:  DO NOT shave, including legs and underarms, 48 hours prior to surgery.   You may shave your face before/day of surgery.  Place clean sheets on your bed the night before surgery Use  a clean washcloth (not used since being washed) for each shower. DO NOT sleep with pet's night before surgery.  CHG Shower Instructions:  Wash your face and private area with normal soap. If you choose to wash your hair, wash first with your normal shampoo.  After you use shampoo/soap, rinse your hair and body thoroughly to remove shampoo/soap residue.  Turn the water OFF and apply half the bottle of CHG soap to a CLEAN washcloth.  Apply CHG soap ONLY FROM YOUR NECK DOWN TO YOUR TOES (washing for 3-5 minutes)  DO NOT use CHG soap on face, private areas, open wounds, or sores.  Pay special attention to the area where your surgery is being performed.  If you are having back surgery, having someone wash your back for you may be helpful. Wait 2 minutes after CHG soap is applied, then you may rinse off the CHG soap.  Pat dry with a clean towel  Put on clean pajamas    Additional instructions for the day of surgery: DO NOT APPLY any lotions, deodorants, cologne, or perfumes.   Do not wear jewelry or makeup Do not wear nail polish, gel polish, artificial nails, or any other type of covering on natural nails (fingers and toes) Do not bring valuables to the hospital. [facility] is not responsible for  valuables/personal belongings. Put on clean/comfortable clothes.  Please brush your teeth.  Ask your nurse before applying any prescription medications to the skin.

## 2023-08-02 ENCOUNTER — Encounter (HOSPITAL_COMMUNITY): Payer: Self-pay

## 2023-08-02 ENCOUNTER — Other Ambulatory Visit: Payer: Self-pay

## 2023-08-02 ENCOUNTER — Encounter (HOSPITAL_COMMUNITY)
Admission: RE | Admit: 2023-08-02 | Discharge: 2023-08-02 | Disposition: A | Discharge status: Home / Self Care | Source: Ambulatory Visit | Attending: Orthopedic Surgery | Admitting: Orthopedic Surgery

## 2023-08-02 VITALS — BP 130/84 | HR 87 | Temp 97.8°F | Resp 16 | Ht 61.0 in | Wt 148.2 lb

## 2023-08-02 DIAGNOSIS — I4510 Unspecified right bundle-branch block: Secondary | ICD-10-CM | POA: Insufficient documentation

## 2023-08-02 DIAGNOSIS — E119 Type 2 diabetes mellitus without complications: Secondary | ICD-10-CM | POA: Diagnosis not present

## 2023-08-02 DIAGNOSIS — Z01812 Encounter for preprocedural laboratory examination: Secondary | ICD-10-CM | POA: Diagnosis not present

## 2023-08-02 DIAGNOSIS — Z01818 Encounter for other preprocedural examination: Secondary | ICD-10-CM | POA: Diagnosis not present

## 2023-08-02 DIAGNOSIS — Z01810 Encounter for preprocedural cardiovascular examination: Secondary | ICD-10-CM | POA: Diagnosis not present

## 2023-08-02 DIAGNOSIS — Z794 Long term (current) use of insulin: Secondary | ICD-10-CM | POA: Diagnosis not present

## 2023-08-02 LAB — BASIC METABOLIC PANEL WITH GFR
Anion gap: 11 (ref 5–15)
BUN: 14 mg/dL (ref 6–20)
CO2: 23 mmol/L (ref 22–32)
Calcium: 8.9 mg/dL (ref 8.9–10.3)
Chloride: 106 mmol/L (ref 98–111)
Creatinine, Ser: 0.55 mg/dL (ref 0.44–1.00)
GFR, Estimated: >60 mL/min (ref >60)
Glucose, Bld: 126 mg/dL — ABNORMAL HIGH (ref 70–99)
Potassium: 3.7 mmol/L (ref 3.5–5.1)
Sodium: 140 mmol/L (ref 135–145)

## 2023-08-02 LAB — CBC
HCT: 37.7 % (ref 36.0–46.0)
Hemoglobin: 10.4 g/dL — ABNORMAL LOW (ref 12.0–15.0)
MCH: 18.5 pg — ABNORMAL LOW (ref 26.0–34.0)
MCHC: 27.6 g/dL — ABNORMAL LOW (ref 30.0–36.0)
MCV: 67.2 fL — ABNORMAL LOW (ref 80.0–100.0)
Platelets: 200 10*3/uL (ref 150–400)
RBC: 5.61 MIL/uL — ABNORMAL HIGH (ref 3.87–5.11)
RDW: 21 % — ABNORMAL HIGH (ref 11.5–15.5)
WBC: 11.4 10*3/uL — ABNORMAL HIGH (ref 4.0–10.5)
nRBC: 0 % (ref 0.0–0.2)

## 2023-08-02 LAB — HEMOGLOBIN A1C
Hgb A1c MFr Bld: 8 % — ABNORMAL HIGH (ref 4.8–5.6)
Mean Plasma Glucose: 182.9 mg/dL

## 2023-08-02 LAB — GLUCOSE, CAPILLARY: Glucose-Capillary: 121 mg/dL — ABNORMAL HIGH (ref 70–99)

## 2023-08-02 NOTE — Progress Notes (Signed)
 PCP - Dr. Abraham Abo Cardiologist - Denies  PPM/ICD - Denies Device Orders - n/a Rep Notified - n/a  Chest x-ray - n/a EKG - 08/02/2023 Stress Test - Denies ECHO - Denies Cardiac Cath - Denies  Sleep Study - Denies CPAP - n/a  Pt is DM2. She checks her blood sugar 2-3x/week. Normal fasting is 100-170. CBG at pre-op appointment 121. A1c result pending.  Last dose of GLP1 agonist- Last dose of Victoza  was 4/21 at noon GLP1 instructions: Pt instructed to not take any doses of Victoza  after 1200 on April 23rd.  Blood Thinner Instructions: n/a Aspirin Instructions: n/a  ERAS Protcol - Clear liquids until 1215 afternoon of surgery PRE-SURGERY Ensure or G2- G2 given to pt with instructions  COVID TEST- n/a   Anesthesia review: Yes. EKG review   Patient denies shortness of breath, fever, cough and chest pain at PAT appointment. Pt denies any respiratory illness/infection in the last two months.    All instructions explained to the patient, with a verbal understanding of the material. Patient agrees to go over the instructions while at home for a better understanding. Patient also instructed to self quarantine after being tested for COVID-19. The opportunity to ask questions was provided. RN offered to also provided instructions in [place] but pt denied.

## 2023-08-04 ENCOUNTER — Other Ambulatory Visit: Payer: Self-pay

## 2023-08-04 ENCOUNTER — Encounter (HOSPITAL_COMMUNITY): Payer: Self-pay | Admitting: Orthopedic Surgery

## 2023-08-04 ENCOUNTER — Ambulatory Visit (HOSPITAL_COMMUNITY): Payer: Self-pay | Admitting: Physician Assistant

## 2023-08-04 ENCOUNTER — Ambulatory Visit (HOSPITAL_COMMUNITY)
Admission: RE | Admit: 2023-08-04 | Discharge: 2023-08-04 | Disposition: A | Discharge status: Home / Self Care | Attending: Orthopedic Surgery | Admitting: Orthopedic Surgery

## 2023-08-04 ENCOUNTER — Encounter (HOSPITAL_COMMUNITY)
Admission: RE | Disposition: A | Discharge status: Home / Self Care | Payer: Self-pay | Source: Home / Self Care | Attending: Orthopedic Surgery

## 2023-08-04 ENCOUNTER — Ambulatory Visit (HOSPITAL_BASED_OUTPATIENT_CLINIC_OR_DEPARTMENT_OTHER): Admitting: Anesthesiology

## 2023-08-04 DIAGNOSIS — M7501 Adhesive capsulitis of right shoulder: Secondary | ICD-10-CM

## 2023-08-04 DIAGNOSIS — E785 Hyperlipidemia, unspecified: Secondary | ICD-10-CM | POA: Diagnosis not present

## 2023-08-04 DIAGNOSIS — M65911 Unspecified synovitis and tenosynovitis, right shoulder: Secondary | ICD-10-CM

## 2023-08-04 DIAGNOSIS — M7521 Bicipital tendinitis, right shoulder: Secondary | ICD-10-CM

## 2023-08-04 DIAGNOSIS — M2550 Pain in unspecified joint: Secondary | ICD-10-CM | POA: Diagnosis not present

## 2023-08-04 DIAGNOSIS — D649 Anemia, unspecified: Secondary | ICD-10-CM | POA: Insufficient documentation

## 2023-08-04 DIAGNOSIS — M7502 Adhesive capsulitis of left shoulder: Secondary | ICD-10-CM

## 2023-08-04 DIAGNOSIS — G8918 Other acute postprocedural pain: Secondary | ICD-10-CM | POA: Diagnosis not present

## 2023-08-04 DIAGNOSIS — Z794 Long term (current) use of insulin: Secondary | ICD-10-CM | POA: Diagnosis not present

## 2023-08-04 DIAGNOSIS — S43431A Superior glenoid labrum lesion of right shoulder, initial encounter: Secondary | ICD-10-CM

## 2023-08-04 DIAGNOSIS — S43432A Superior glenoid labrum lesion of left shoulder, initial encounter: Secondary | ICD-10-CM | POA: Insufficient documentation

## 2023-08-04 DIAGNOSIS — M65912 Unspecified synovitis and tenosynovitis, left shoulder: Secondary | ICD-10-CM | POA: Diagnosis not present

## 2023-08-04 DIAGNOSIS — M1990 Unspecified osteoarthritis, unspecified site: Secondary | ICD-10-CM | POA: Diagnosis not present

## 2023-08-04 DIAGNOSIS — E119 Type 2 diabetes mellitus without complications: Secondary | ICD-10-CM | POA: Diagnosis not present

## 2023-08-04 DIAGNOSIS — Z79899 Other long term (current) drug therapy: Secondary | ICD-10-CM | POA: Diagnosis not present

## 2023-08-04 DIAGNOSIS — Z01818 Encounter for other preprocedural examination: Secondary | ICD-10-CM

## 2023-08-04 DIAGNOSIS — I10 Essential (primary) hypertension: Secondary | ICD-10-CM | POA: Diagnosis not present

## 2023-08-04 DIAGNOSIS — Z7984 Long term (current) use of oral hypoglycemic drugs: Secondary | ICD-10-CM | POA: Diagnosis not present

## 2023-08-04 HISTORY — PX: POSTERIOR LUMBAR FUSION 2 WITH HARDWARE REMOVAL: SHX7297

## 2023-08-04 HISTORY — PX: BICEPT TENODESIS: SHX5116

## 2023-08-04 HISTORY — PX: SHOULDER CLOSED REDUCTION: SHX1051

## 2023-08-04 LAB — POCT PREGNANCY, URINE: Preg Test, Ur: NEGATIVE

## 2023-08-04 LAB — GLUCOSE, CAPILLARY
Glucose-Capillary: 120 mg/dL — ABNORMAL HIGH (ref 70–99)
Glucose-Capillary: 104 mg/dL — ABNORMAL HIGH (ref 70–99)
Glucose-Capillary: 123 mg/dL — ABNORMAL HIGH (ref 70–99)

## 2023-08-04 SURGERY — ARTHROSCOPY, SHOULDER WITH DEBRIDEMENT
Anesthesia: General | Site: Shoulder | Laterality: Left

## 2023-08-04 MED ORDER — PROPOFOL 10 MG/ML IV BOLUS
INTRAVENOUS | Status: AC
  Filled 2023-08-04: qty 20

## 2023-08-04 MED ORDER — CHLORHEXIDINE GLUCONATE 0.12 % MT SOLN: 15.0000 mL | Freq: Once | OROMUCOSAL | Status: AC

## 2023-08-04 MED ORDER — INSULIN ASPART 100 UNIT/ML IJ SOLN: 0.0000 [IU] | INTRAMUSCULAR | Status: DC | PRN

## 2023-08-04 MED ORDER — MIDAZOLAM HCL 2 MG/2ML IJ SOLN
INTRAMUSCULAR | Status: AC
  Administered 2023-08-04: 2 mg via INTRAVENOUS
  Filled 2023-08-04: qty 2

## 2023-08-04 MED ORDER — BUPIVACAINE LIPOSOME 1.3 % IJ SUSP
INTRAMUSCULAR | Status: DC | PRN
  Administered 2023-08-04: 10 mL via PERINEURAL

## 2023-08-04 MED ORDER — LIDOCAINE 2% (20 MG/ML) 5 ML SYRINGE
INTRAMUSCULAR | Status: DC | PRN
  Administered 2023-08-04: 80 mg via INTRAVENOUS

## 2023-08-04 MED ORDER — ONDANSETRON HCL 4 MG/2ML IJ SOLN: 4.0000 mg | Freq: Once | INTRAMUSCULAR | Status: DC | PRN

## 2023-08-04 MED ORDER — LIDOCAINE 2% (20 MG/ML) 5 ML SYRINGE
INTRAMUSCULAR | Status: AC
  Filled 2023-08-04: qty 5

## 2023-08-04 MED ORDER — PHENYLEPHRINE 80 MCG/ML (10ML) SYRINGE FOR IV PUSH (FOR BLOOD PRESSURE SUPPORT)
PREFILLED_SYRINGE | INTRAVENOUS | Status: AC
  Filled 2023-08-04: qty 20

## 2023-08-04 MED ORDER — SUCCINYLCHOLINE CHLORIDE 200 MG/10ML IV SOSY
PREFILLED_SYRINGE | INTRAVENOUS | Status: DC | PRN
  Administered 2023-08-04: 100 mg via INTRAVENOUS

## 2023-08-04 MED ORDER — PHENYLEPHRINE 80 MCG/ML (10ML) SYRINGE FOR IV PUSH (FOR BLOOD PRESSURE SUPPORT)
PREFILLED_SYRINGE | INTRAVENOUS | Status: DC | PRN
  Administered 2023-08-04 (×2): 80 ug via INTRAVENOUS
  Administered 2023-08-04 (×3): 160 ug via INTRAVENOUS

## 2023-08-04 MED ORDER — CEFAZOLIN SODIUM-DEXTROSE 2-4 GM/100ML-% IV SOLN
INTRAVENOUS | Status: AC
Start: 2023-08-04 — End: 2023-08-04
  Filled 2023-08-04: qty 100

## 2023-08-04 MED ORDER — ONDANSETRON HCL 4 MG/2ML IJ SOLN
INTRAMUSCULAR | Status: AC
Start: 2023-08-04 — End: ?
  Filled 2023-08-04: qty 2

## 2023-08-04 MED ORDER — CELECOXIB 100 MG PO CAPS: 100.0000 mg | ORAL_CAPSULE | Freq: Two times a day (BID) | ORAL | 0 refills | Status: DC

## 2023-08-04 MED ORDER — OXYCODONE HCL 5 MG PO TABS: 5.0000 mg | ORAL_TABLET | Freq: Once | ORAL | Status: DC | PRN

## 2023-08-04 MED ORDER — METHOCARBAMOL 500 MG PO TABS: 500.0000 mg | ORAL_TABLET | Freq: Three times a day (TID) | ORAL | 1 refills | Status: AC | PRN

## 2023-08-04 MED ORDER — FENTANYL CITRATE (PF) 100 MCG/2ML IJ SOLN
INTRAMUSCULAR | Status: AC
  Administered 2023-08-04: 50 ug via INTRAVENOUS
  Filled 2023-08-04: qty 2

## 2023-08-04 MED ORDER — MIDAZOLAM HCL 2 MG/2ML IJ SOLN: 2.0000 mg | Freq: Once | INTRAMUSCULAR | Status: AC

## 2023-08-04 MED ORDER — CEFAZOLIN SODIUM-DEXTROSE 2-4 GM/100ML-% IV SOLN
2.0000 g | INTRAVENOUS | Status: AC
  Administered 2023-08-04: 2 g via INTRAVENOUS

## 2023-08-04 MED ORDER — ORAL CARE MOUTH RINSE: 15.0000 mL | Freq: Once | OROMUCOSAL | Status: AC

## 2023-08-04 MED ORDER — ROCURONIUM BROMIDE 10 MG/ML (PF) SYRINGE
PREFILLED_SYRINGE | INTRAVENOUS | Status: AC
  Filled 2023-08-04: qty 10

## 2023-08-04 MED ORDER — FENTANYL CITRATE (PF) 100 MCG/2ML IJ SOLN: 50.0000 ug | Freq: Once | INTRAMUSCULAR | Status: AC

## 2023-08-04 MED ORDER — FENTANYL CITRATE (PF) 100 MCG/2ML IJ SOLN: 25.0000 ug | INTRAMUSCULAR | Status: DC | PRN

## 2023-08-04 MED ORDER — POVIDONE-IODINE 7.5 % EX SOLN: Freq: Once | CUTANEOUS | Status: DC

## 2023-08-04 MED ORDER — ONDANSETRON HCL 4 MG/2ML IJ SOLN
INTRAMUSCULAR | Status: DC | PRN
  Administered 2023-08-04: 4 mg via INTRAVENOUS

## 2023-08-04 MED ORDER — BUPIVACAINE HCL (PF) 0.5 % IJ SOLN
INTRAMUSCULAR | Status: DC | PRN
Start: 2023-08-04 — End: 2023-08-04
  Administered 2023-08-04: 15 mL via PERINEURAL

## 2023-08-04 MED ORDER — CHLORHEXIDINE GLUCONATE 0.12 % MT SOLN
OROMUCOSAL | Status: AC
  Administered 2023-08-04: 15 mL via OROMUCOSAL
  Filled 2023-08-04: qty 15

## 2023-08-04 MED ORDER — SODIUM CHLORIDE 0.9 % IR SOLN
Status: DC | PRN
  Administered 2023-08-04 (×2): 3000 mL

## 2023-08-04 MED ORDER — DEXAMETHASONE SODIUM PHOSPHATE 10 MG/ML IJ SOLN
INTRAMUSCULAR | Status: AC
  Filled 2023-08-04: qty 1

## 2023-08-04 MED ORDER — PROPOFOL 10 MG/ML IV BOLUS
INTRAVENOUS | Status: DC | PRN
  Administered 2023-08-04: 160 mg via INTRAVENOUS

## 2023-08-04 MED ORDER — FENTANYL CITRATE (PF) 250 MCG/5ML IJ SOLN
INTRAMUSCULAR | Status: AC
  Filled 2023-08-04: qty 5

## 2023-08-04 MED ORDER — TRANEXAMIC ACID-NACL 1000-0.7 MG/100ML-% IV SOLN
INTRAVENOUS | Status: AC
  Filled 2023-08-04: qty 100

## 2023-08-04 MED ORDER — LACTATED RINGERS IV SOLN: INTRAVENOUS | Status: DC

## 2023-08-04 MED ORDER — OXYCODONE HCL 5 MG/5ML PO SOLN: 5.0000 mg | Freq: Once | ORAL | Status: DC | PRN

## 2023-08-04 MED ORDER — DEXAMETHASONE SODIUM PHOSPHATE 10 MG/ML IJ SOLN
INTRAMUSCULAR | Status: DC | PRN
  Administered 2023-08-04: 5 mg via INTRAVENOUS

## 2023-08-04 MED ORDER — PHENYLEPHRINE HCL-NACL 20-0.9 MG/250ML-% IV SOLN
INTRAVENOUS | Status: DC | PRN
  Administered 2023-08-04: 25 ug/min via INTRAVENOUS

## 2023-08-04 MED ORDER — BUPIVACAINE LIPOSOME 1.3 % IJ SUSP
INTRAMUSCULAR | Status: AC
  Filled 2023-08-04: qty 10

## 2023-08-04 MED ORDER — FENTANYL CITRATE (PF) 250 MCG/5ML IJ SOLN
INTRAMUSCULAR | Status: DC | PRN
  Administered 2023-08-04 (×2): 50 ug via INTRAVENOUS

## 2023-08-04 MED ORDER — 0.9 % SODIUM CHLORIDE (POUR BTL) OPTIME
TOPICAL | Status: DC | PRN
Start: 2023-08-04 — End: 2023-08-04
  Administered 2023-08-04: 1000 mL

## 2023-08-04 MED ORDER — OXYCODONE HCL 5 MG PO TABS: 5.0000 mg | ORAL_TABLET | ORAL | 0 refills | Status: AC | PRN

## 2023-08-04 MED ORDER — TRANEXAMIC ACID-NACL 1000-0.7 MG/100ML-% IV SOLN
1000.0000 mg | INTRAVENOUS | Status: AC
  Administered 2023-08-04: 1000 mg via INTRAVENOUS

## 2023-08-04 MED ORDER — POVIDONE-IODINE 10 % EX SWAB
2.0000 | Freq: Once | CUTANEOUS | Status: AC
  Administered 2023-08-04: 2 via TOPICAL

## 2023-08-04 SURGICAL SUPPLY — 60 items
ANCHOR SUT 1.8 FIBERTAK SB KL (Anchor) IMPLANT
BAG COUNTER SPONGE SURGICOUNT (BAG) IMPLANT
BENZOIN TINCTURE PRP APPL 2/3 (GAUZE/BANDAGES/DRESSINGS) IMPLANT
BLADE EXCALIBUR 4.0X13 (MISCELLANEOUS) IMPLANT
BLADE SHAVER TORPEDO 4X13 (MISCELLANEOUS) IMPLANT
BLADE SURG 10 STRL SS (BLADE) IMPLANT
BLADE SURG 11 STRL SS (BLADE) IMPLANT
BURR OVAL 8 FLU 4.0X13 (MISCELLANEOUS) IMPLANT
COVER SURGICAL LIGHT HANDLE (MISCELLANEOUS) ×1 IMPLANT
DRAPE INCISE IOBAN 66X45 STRL (DRAPES) ×2 IMPLANT
DRAPE STERI 35X30 U-POUCH (DRAPES) ×1 IMPLANT
DRAPE SURG ORHT 6 SPLT 77X108 (DRAPES) IMPLANT
DRAPE U-SHAPE 47X51 STRL (DRAPES) ×2 IMPLANT
DRSG TEGADERM 4X4.75 (GAUZE/BANDAGES/DRESSINGS) ×3 IMPLANT
DRSG XEROFORM 1X8 (GAUZE/BANDAGES/DRESSINGS) IMPLANT
DURAPREP 26ML APPLICATOR (WOUND CARE) ×1 IMPLANT
DW OUTFLOW CASSETTE/TUBE SET (MISCELLANEOUS) ×1 IMPLANT
ELECTRODE REM PT RTRN 9FT ADLT (ELECTROSURGICAL) ×1 IMPLANT
GAUZE SPONGE 4X4 12PLY STRL (GAUZE/BANDAGES/DRESSINGS) ×1 IMPLANT
GAUZE SPONGE 4X4 12PLY STRL LF (GAUZE/BANDAGES/DRESSINGS) ×1 IMPLANT
GLOVE BIO SURGEON STRL SZ 6.5 (GLOVE) IMPLANT
GLOVE BIOGEL PI IND STRL 6.5 (GLOVE) IMPLANT
GLOVE BIOGEL PI IND STRL 7.0 (GLOVE) ×1 IMPLANT
GLOVE BIOGEL PI IND STRL 8 (GLOVE) ×1 IMPLANT
GLOVE ECLIPSE 7.0 STRL STRAW (GLOVE) ×1 IMPLANT
GLOVE ECLIPSE 8.0 STRL XLNG CF (GLOVE) ×1 IMPLANT
GOWN STRL REUS W/ TWL LRG LVL3 (GOWN DISPOSABLE) ×3 IMPLANT
KIT BASIN OR (CUSTOM PROCEDURE TRAY) ×1 IMPLANT
KIT STR SPEAR 1.8 FBRTK DISP (KITS) IMPLANT
KIT TURNOVER KIT B (KITS) ×1 IMPLANT
MANIFOLD NEPTUNE II (INSTRUMENTS) ×1 IMPLANT
NDL SCORPION MULTI FIRE (NEEDLE) IMPLANT
NDL SPNL 18GX3.5 QUINCKE PK (NEEDLE) ×1 IMPLANT
NDL SUT 6 .5 CRC .975X.05 MAYO (NEEDLE) IMPLANT
NEEDLE SCORPION MULTI FIRE (NEEDLE) IMPLANT
NEEDLE SPNL 18GX3.5 QUINCKE PK (NEEDLE) ×1 IMPLANT
NS IRRIG 1000ML POUR BTL (IV SOLUTION) ×1 IMPLANT
NS IRRIG 500ML POUR BTL (IV SOLUTION) IMPLANT
PACK SHOULDER (CUSTOM PROCEDURE TRAY) ×1 IMPLANT
PAD ARMBOARD POSITIONER FOAM (MISCELLANEOUS) ×2 IMPLANT
PROBE APOLLO 90XL (SURGICAL WAND) ×1 IMPLANT
RESTRAINT HEAD UNIVERSAL NS (MISCELLANEOUS) ×1 IMPLANT
SLING ARM IMMOBILIZER LRG (SOFTGOODS) IMPLANT
SPONGE T-LAP 4X18 ~~LOC~~+RFID (SPONGE) ×2 IMPLANT
STRIP CLOSURE SKIN 1/2X4 (GAUZE/BANDAGES/DRESSINGS) ×1 IMPLANT
SUCTION TUBE FRAZIER 10FR DISP (SUCTIONS) ×1 IMPLANT
SUT ETHILON 3 0 PS 1 (SUTURE) ×1 IMPLANT
SUT MNCRL AB 3-0 PS2 18 (SUTURE) ×1 IMPLANT
SUT VIC AB 0 CT1 27XBRD ANBCTR (SUTURE) ×1 IMPLANT
SUT VIC AB 1 CT1 27XBRD ANBCTR (SUTURE) IMPLANT
SUT VIC AB 2-0 CT2 27 (SUTURE) ×1 IMPLANT
SUT VICRYL 0 UR6 27IN ABS (SUTURE) ×1 IMPLANT
SUT VICRYL 1 TIES 12X18 (SUTURE) ×1 IMPLANT
SUTURE FIBERWR #2 38 T-5 BLUE (SUTURE) IMPLANT
SUTURE TAPE 1.3 FIBERLOP 20 ST (SUTURE) IMPLANT
SYSTEM FBRTK BUTTON 2.6 (Anchor) IMPLANT
TOWEL GREEN STERILE (TOWEL DISPOSABLE) ×1 IMPLANT
TOWEL GREEN STERILE FF (TOWEL DISPOSABLE) ×1 IMPLANT
TUBING ARTHROSCOPY IRRIG 16FT (MISCELLANEOUS) ×1 IMPLANT
WATER STERILE IRR 1000ML POUR (IV SOLUTION) ×1 IMPLANT

## 2023-08-04 NOTE — Progress Notes (Signed)
 STAT order placed per Dr Rozelle Corning. He would like patient seen tomorrow, if possible.

## 2023-08-04 NOTE — Anesthesia Procedure Notes (Addendum)
 Anesthesia Regional Block: Interscalene brachial plexus block   Pre-Anesthetic Checklist: , timeout performed,  Correct Patient, Correct Site, Correct Laterality,  Correct Procedure, Correct Position, site marked,  Risks and benefits discussed,  Pre-op evaluation,  At surgeon's request and post-op pain management  Laterality: Left  Prep: Maximum Sterile Barrier Precautions used, chloraprep       Needles:  Injection technique: Single-shot  Needle Type: Echogenic Stimulator Needle     Needle Length: 5cm  Needle Gauge: 21     Additional Needles:   Procedures:,,,, ultrasound used (permanent image in chart),,    Narrative:  Start time: 08/04/2023 3:10 PM End time: 08/04/2023 3:14 PM Injection made incrementally with aspirations every 5 mL. Anesthesiologist: Grace Laura, MD

## 2023-08-04 NOTE — Op Note (Signed)
 NAMESTEPANIE, GRAVER MEDICAL RECORD NO: [id number] ACCOUNT NO: [id number] DATE OF BIRTH: [birth year] FACILITY: [facility] LOCATION: [facility] PHYSICIAN: Gloriann Larger. Rozelle Corning, MD  Operative Report   DATE OF PROCEDURE: 08/04/2023  PREOPERATIVE DIAGNOSIS:  Left frozen shoulder.  POSTOPERATIVE DIAGNOSIS:  Left frozen shoulder and unstable superior labral tear.  PROCEDURE:  Left shoulder manipulation under anesthesia with arthroscopy, extensive debridement, and rotator interval release as well as mini-open biceps tenodesis.  SURGEON:  Gloriann Larger. Rozelle Corning, MD  ASSISTANT:  Prentis Brock.  INDICATIONS:  The patient is a 48 year old with adhesive capsulitis in the left shoulder for many months.  The patient presents for operative management after explanation of risks, benefits, and failure of conservative management.  DESCRIPTION OF PROCEDURE:  The patient was brought to the operating room where general anesthetic was induced.  Preoperative antibiotics were administered.  Timeout was called.  Left shoulder was examined under anesthesia.  Found to have forward flexion  of about 90 degrees, isolated glenohumeral abduction of about 60 as well as external rotation to about 20.  Arm was manipulated into full forward flexion 180 degrees along with abduction to 120 degrees and external rotation to about 60 degrees.  This was  increased more with rotator interval release.  Following manipulation under anesthesia, the patient was placed in the beach chair position with the head in neutral position.  The left shoulder, arm, and hand were prescrubbed with alcohol and Betadine ,  allowed to air dry, prepped with DuraPrep solution, and draped in a sterile manner.  Ioban was used to seal the operative field.  A posterior portal was created 2 cm medial and inferior to the posterolateral margin of the acromion.  Diagnostic  arthroscopy was performed.  The patient had significant synovitis throughout the rotator interval and the shoulder  itself.  Glenohumeral articular surfaces were intact.  Anterior, inferior, and posterior inferior glenohumeral ligaments were intact.   Rotator cuff was intact.  The patient did have a significantly unstable SLAP tear from the 10 o'clock to 2 o'clock position.  There was undermining of the biceps anchor.  At this time, debridement was performed around the labrum to see if it could be  salvaged.  However, the biceps anchor was too unstable.  Biceps tendon release was performed.  Further debridement of the superior labrum was performed.  Debridement of the rotator interval was also performed and there was release of the rotator interval  using the ArthroCare wand.  The ArthroCare wand was also used to debride the superior labrum as well as some mild rotator cuff fraying.  Next, instruments were removed.  The patient did achieve about 80 degrees of external rotation after rotator  interval release.  Portals were closed.  Ioban was used to cover the operative field.  The portals were closed using 3-0 nylon.  About a 1-1/2 inch incision was made off the anterolateral margin of the acromion.  Skin and subcutaneous tissues were  sharply divided.  Raphe between the anteromedial deltoids was identified.  Marked at 4 cm from the anterolateral margin of the acromion with a #1 Vicryl suture.  Deltoid split.  The biceps tendon was identified after opening up the transverse humeral  ligament.  Next, FiberLoop suture was placed within the biceps tendon and it was then tenodesed into the bicipital groove using Arthrex knotless suture anchors both distally and proximally.  Secure fixation was achieved under appropriate tension.  At  this time, thorough irrigation was performed.  Rotator cuff intact from the bursal side.  Next, the deltoid split was closed using #1 Vicryl suture, 0 Vicryl suture, 2-0 Vicryl suture, and 3-0 Monocryl with Steri-Strips and impervious dressing and a  shoulder sling applied.  Luke's assistance was  required for opening and closing, mobilization of tissue.  His assistance was a medical necessity.   PUS D: 08/04/2023 5:23:28 pm T: 08/04/2023 8:02:00 pm  JOB: [id number]/ [id number]

## 2023-08-04 NOTE — Anesthesia Procedure Notes (Signed)
 Procedure Name: Intubation Date/Time: 08/04/2023 3:43 PM  Performed by: Joshuan Bolander J, CRNAPre-anesthesia Checklist: Patient identified, Emergency Drugs available, Suction available and Patient being monitored Patient Re-evaluated:Patient Re-evaluated prior to induction Oxygen Delivery Method: Circle System Utilized Preoxygenation: Pre-oxygenation with 100% oxygen Induction Type: IV induction Ventilation: Mask ventilation without difficulty Laryngoscope Size: Miller and 3 Grade View: Grade I Tube type: Oral Tube size: 7.0 mm Number of attempts: 1 Airway Equipment and Method: Stylet and Oral airway Placement Confirmation: ETT inserted through vocal cords under direct vision, positive ETCO2 and breath sounds checked- equal and bilateral Secured at: 21 cm Tube secured with: Tape Dental Injury: Teeth and Oropharynx as per pre-operative assessment

## 2023-08-04 NOTE — Therapy (Signed)
 OUTPATIENT PHYSICAL THERAPY SHOULDER EVALUATION   Patient Name: [name] MRN: [id number] DOB:Dec 26, 1975, 48 y.o., female Today's Date: 08/05/2023  END OF SESSION:  PT End of Session - 08/05/23 0849     Visit Number 1    Number of Visits 24    Date for PT Re-Evaluation 10/28/23    Authorization Type AET[employer]    PT Start Time 0800    PT Stop Time 0845    PT Time Calculation (min) 45 min    Activity Tolerance Patient tolerated treatment well;No increased pain;Patient limited by pain    Behavior During Therapy [facility] for tasks assessed/performed             Past Medical History:  Diagnosis Date   DM type 2 (diabetes mellitus, type 2) (HCC) 07/13/2011   Gestational diabetes    With first pregnancy, glyburide    HTN complicating peripregnancy, antepartum, third trimester 10/11/2016   Obesity (BMI 30-39.9)    Pregnancy induced hypertension    Uterine scar from previous cesarean delivery affecting pregnancy 10/11/2016   Past Surgical History:  Procedure Laterality Date   CESAREAN SECTION N/A 04/10/2013   Procedure: CESAREAN SECTION;  Surgeon: Ana Balling, MD;  Location: [facility];  Service: Obstetrics;  Laterality: N/A;   CESAREAN SECTION N/A 10/11/2016   Procedure: CESAREAN SECTION;  Surgeon: Margaretmary Shaver, MD;  Location: [facility];  Service: Obstetrics;  Laterality: N/A;  [name], RNFA   PERINEUM REPAIR N/A 04/10/2013   Procedure: EPISIOTOMY REPAIR;  Surgeon: Ana Balling, MD;  Location: [facility];  Service: Obstetrics;  Laterality: N/A;   Patient Active Problem List   Diagnosis Date Noted   Adhesive capsulitis of left shoulder 03/01/2023   Dyspepsia 12/23/2021   Peeling skin 04/07/2021   Healthcare maintenance 12/22/2018   Hyperlipemia 08/26/2014   Microalbuminuria 08/26/2014   HTN (hypertension) 06/21/2013   Type 2 diabetes mellitus with microalbuminuria, without long-term current use of insulin  (HCC) 07/13/2011    PCP: Abraham Abo, MD  REFERRING PROVIDER:  Jasmine Mesi, MD  REFERRING DIAG: M75.02 (ICD-10-CM) - Adhesive capsulitis of left shoulde  THERAPY DIAG:  Abnormal posture - Plan: PT plan of care cert/re-cert  Muscle weakness (generalized) - Plan: PT plan of care cert/re-cert  Localized edema - Plan: PT plan of care cert/re-cert  Chronic left shoulder pain - Plan: PT plan of care cert/re-cert  Stiffness of left shoulder, not elsewhere classified - Plan: PT plan of care cert/re-cert  Rationale for Evaluation and Treatment: Rehabilitation  ONSET DATE: 08/04/2023 manipulation under anesthesia with arthroscopy, extensive debridement, rotator interval release and biceps tenodesis, history of 1.5 years  SUBJECTIVE:  SUBJECTIVE STATEMENT: [name] had a manipulation yesterday.  1.5 year history of left shoulder pain.  Hand dominance: Left  PERTINENT HISTORY: Type 2 DM  PAIN:  Are you having pain? Yes: NPRS scale: Still feeling affects of anesthesia this morning Pain location: Left shoulder Pain description: NA Aggravating factors: NA Relieving factors: NA  PRECAUTIONS: Other: Post-surgical/manipulation shoulder  RED FLAGS: None   WEIGHT BEARING RESTRICTIONS: No  FALLS:  Has patient fallen in last 6 months? No  LIVING ENVIRONMENT: Lives with: lives with their family, lives with their spouse, lives with their daughter, and and mother Lives in: House/apartment Stairs:  No problem Has following equipment at home: None  OCCUPATION: Nail tech  PLOF: Independent  PATIENT GOALS:Return to normal function as quickly as possible  NEXT MD VISIT:   OBJECTIVE:  Note: Objective measures were completed at Evaluation unless otherwise noted.  DIAGNOSTIC FINDINGS:  Radiographs of her left shoulder demonstrate no acute fractures no  significant  degenerative changes  PATIENT SURVEYS:   Patient specific functional scale (0 unable to 10 no difficulty) 1) Reaching   0/10 2) Wash/style hair  0/10 3) Reach overhead 0/10 Total    0/30 or 0%  COGNITION: Overall cognitive status: Within functional limits for tasks assessed     SENSATION: Still feeling anesthesia  POSTURE: Rounded shoulders and forward head in the sling  UPPER EXTREMITY ROM:   Passive ROM Left/Right 08/04/2023   Shoulder flexion 150/170   Shoulder extension    Shoulder abduction    Shoulder horizontal adduction 35/40   Shoulder internal rotation 70/55   Shoulder external rotation 60/105   Elbow flexion    Elbow extension    Wrist flexion    Wrist extension    Wrist ulnar deviation    Wrist radial deviation    Wrist pronation    Wrist supination    (Blank rows = not tested)  UPPER EXTREMITY STRENGTH: Deferred due to surgery/manipulation yesterday evening  Assessed in pounds with hand-held dynamometer Left/Right 08/05/2023   Shoulder flexion    Shoulder extension    Shoulder abduction    Shoulder adduction    Shoulder internal rotation    Shoulder external rotation    Middle trapezius    Lower trapezius    Elbow flexion    Elbow extension    Wrist flexion    Wrist extension    Wrist ulnar deviation    Wrist radial deviation    Wrist pronation    Wrist supination    Grip strength (lbs)    (Blank rows = not tested)                                                                                                                            TREATMENT DATE:  08/05/2023 Codman's/pendulum: forward and back; side-to-side; clockwise and counterclockwise 20 times each (cues to keep her torso is close to parallel to the floor as possible and relax the shoulder to maximize left  shoulder range of motion) Pulley protraction with flexion active assisted range of motion with physical therapist assistance for protraction and grip on the pulley secondary to the  nerve block still affecting left upper extremity function 10 x 5 seconds  Reviewed exam findings and day 1 home exercise program  PATIENT EDUCATION: Education details: See above Person educated: Patient and Spouse Education method: Explanation, Demonstration, Tactile cues, Verbal cues, and Handouts Education comprehension: verbalized understanding, returned demonstration, verbal cues required, tactile cues required, and needs further education  HOME EXERCISE PROGRAM: Access Code: [email or web address] URL: [email or web address] Date: 08/05/2023 Prepared by: Terral Ferrari  Exercises - Pendulums  - 5 x daily - 7 x weekly - 1 sets - 20 reps - Seated Shoulder Flexion AAROM with Pulley Behind  - 1 x daily - 7 x weekly - 1 sets - 10 reps - 10 seconds hold  ASSESSMENT:  CLINICAL IMPRESSION: Patient is a 48 y.o. female who was seen today for physical therapy evaluation and treatment for s/p left shoulder manipulation under anesthesia with arthroscopy, extensive debridement, rotator interval release and mini-open biceps tenodesis.  Rae had her procedure last night and she is still feeling the effects of the nerve block with her left upper extremity.  Early emphasis will be on range of motion to make sure she does not freeze back up regards to her range of motion.  Appropriate scapular and rotator cuff strengthening will be implemented as appropriate to facilitate meeting the below listed long-term goals.  OBJECTIVE IMPAIRMENTS: decreased activity tolerance, decreased endurance, decreased knowledge of condition, decreased ROM, decreased strength, increased edema, impaired perceived functional ability, impaired sensation, impaired UE functional use, and pain.   ACTIVITY LIMITATIONS: carrying, lifting, sleeping, dressing, and reach over head  PARTICIPATION LIMITATIONS: meal prep, cleaning, laundry, driving, shopping, community activity, and occupation  PERSONAL FACTORS: Type 2 DM is also  affecting patient's functional outcome.   REHAB POTENTIAL: Good  CLINICAL DECISION MAKING: Stable/uncomplicated  EVALUATION COMPLEXITY: Low   GOALS: Goals reviewed with patient? Yes  SHORT TERM GOALS: Target date: 09/16/2023  Dilan will be independent with her day 1 home exercise program Baseline: Started 08/05/2023 Goal status: INITIAL  2.  Improve left shoulder active range of motion for flexion to 150 degrees; IR to 70 degrees; ER to 70 degrees and horizontal adduction to 40 degrees Baseline: Passive range of motion 150; 70; 60 and 35 respectively Goal status: INITIAL   LONG TERM GOALS: Target date: 10/28/2023  Improve patient specific functional score to at least 70% Baseline: 0% Goal status: INITIAL  2.  Brittnie will report left shoulder pain consistently 0-3/10 on numeric pain rating scale Baseline: Left upper extremity was completely numb due to a nerve block at evaluation Goal status: INITIAL  3.  Improve left shoulder active range of motion to 90% of flexion at 170; IR at 60; ER to 70 and horizontal adduction to 40 degrees Baseline: See objective Goal status: INITIAL  4.  Improve left shoulder strength to at least 60% of the uninvolved right Baseline: Strength testing deferred at evaluation secondary to her procedure being less than 24 hours ago Goal status: INITIAL  5.  Leanda will be independent with her long-term home maintenance exercise program at discharge Baseline: Started 08/05/2023 Goal status: INITIAL  PLAN:  PT FREQUENCY: 2-3 times a week  PT DURATION: 12 weeks  PLANNED INTERVENTIONS: 97110-Therapeutic exercises, 97530- Therapeutic activity, 97112- Neuromuscular re-education, 97535- Self Care, 95284- Manual therapy, 97016- Vasopneumatic device, Patient/Family education, Joint mobilization, Cryotherapy, and Moist  heat  PLAN FOR NEXT SESSION: Passive range of motion along with appropriate active assistive and active range of motion activities.  Early  emphasis on maintaining and improving active range of motion as her biggest issue presurgery was a severe adhesive capsulitis.  Appropriate progressions and scapular rotator cuff strengthening as appropriate.   Joli Neas, PT, MPT 08/05/2023, 9:08 AM

## 2023-08-04 NOTE — Transfer of Care (Signed)
 Immediate Anesthesia Transfer of Care Note  Patient: [name]  Procedure(s) Performed: LEFT SHOULDER ARTHROSCOPY WITH EXTENSIVE DEBRIDEMENT (Left: Shoulder) LEFT SHOULDER MANIPULATION WITH ANESTHESIA (Left: Shoulder) LEFT OPEN BICEPS TENODESIS (Left: Shoulder)  Patient Location: PACU  Anesthesia Type:General and GA combined with regional for post-op pain  Level of Consciousness: awake, alert , and oriented  Airway & Oxygen Therapy: Patient Spontanous Breathing and Patient connected to face mask oxygen  Post-op Assessment: Report given to RN and Post -op Vital signs reviewed and stable  Post vital signs: Reviewed and stable  Last Vitals:  Vitals Value Taken Time  BP 116/76 08/04/23 1730  Temp    Pulse 121 08/04/23 1736  Resp 21 08/04/23 1736  SpO2 98 % 08/04/23 1736  Vitals shown include unfiled device data.  Last Pain:  Vitals:   08/04/23 1520  TempSrc:   PainSc: 0-No pain      Patients Stated Pain Goal: 1 (08/04/23 1332)  Complications: No notable events documented.

## 2023-08-04 NOTE — Brief Op Note (Signed)
   08/04/2023  5:18 PM  PATIENT:  [name]  48 y.o. female  PRE-OPERATIVE DIAGNOSIS:  left frozen shoulder  POST-OPERATIVE DIAGNOSIS:  left frozen shoulder, unstable superior labral tear  PROCEDURE:  Procedure(s): LEFT SHOULDER ARTHROSCOPY WITH EXTENSIVE DEBRIDEMENT LEFT SHOULDER MANIPULATION WITH ANESTHESIA LEFT OPEN BICEPS TENODESIS  SURGEON:  Surgeon(s): Jasmine Mesi, MD  ASSISTANT: magnant pa  ANESTHESIA:   general  EBL: 10 ml    No intake/output data recorded.  BLOOD ADMINISTERED: none  DRAINS: none   LOCAL MEDICATIONS USED:  none  SPECIMEN:  No Specimen  COUNTS:  YES  TOURNIQUET:  * No tourniquets in log *  DICTATION: .Other Dictation: Dictation Number [id number]  PLAN OF CARE: Discharge to home after PACU  PATIENT DISPOSITION:  PACU - hemodynamically stable

## 2023-08-04 NOTE — H&P (Signed)
 [name] is an 48 y.o. female.   Chief Complaint: left shoulder stiffness HPI: [name] is a 48 y.o. female who presents to the office reporting left shoulder pain and stiffness along with numbness and tingling in the arm.  Symptoms ongoing now for about a year.  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  She is diabetic but her last hemoglobin A1c was 8.1.  She reports numbness and tingling going down both arms.  Shoulder has been stiff and she denies any history of trauma or injury.  Radiographs reviewed unremarkable..    Past Medical History:  Diagnosis Date   DM type 2 (diabetes mellitus, type 2) (HCC) 07/13/2011   Gestational diabetes    With first pregnancy, glyburide    HTN complicating peripregnancy, antepartum, third trimester 10/11/2016   Obesity (BMI 30-39.9)    Pregnancy induced hypertension    Uterine scar from previous cesarean delivery affecting pregnancy 10/11/2016    Past Surgical History:  Procedure Laterality Date   CESAREAN SECTION N/A 04/10/2013   Procedure: CESAREAN SECTION;  Surgeon: Ana Balling, MD;  Location: [facility];  Service: Obstetrics;  Laterality: N/A;   CESAREAN SECTION N/A 10/11/2016   Procedure: CESAREAN SECTION;  Surgeon: Margaretmary Shaver, MD;  Location: [facility];  Service: Obstetrics;  Laterality: N/A;  [name], RNFA   PERINEUM REPAIR N/A 04/10/2013   Procedure: EPISIOTOMY REPAIR;  Surgeon: Ana Balling, MD;  Location: [facility];  Service: Obstetrics;  Laterality: N/A;    Family History  Problem Relation Age of Onset   Cancer Father    Alcohol abuse Neg Hx    Arthritis Neg Hx    Asthma Neg Hx    Birth defects Neg Hx    COPD Neg Hx    Depression Neg Hx    Diabetes Neg Hx    Drug abuse Neg Hx    Early death Neg Hx    Hearing loss Neg Hx    Heart disease Neg Hx    Hyperlipidemia Neg Hx    Hypertension Neg Hx    Kidney disease Neg Hx    Learning disabilities Neg Hx    Mental illness Neg Hx    Mental retardation Neg Hx    Miscarriages /  Stillbirths Neg Hx    Stroke Neg Hx    Vision loss Neg Hx    Varicose Veins Neg Hx    Breast cancer Neg Hx    Social History:  reports that she has never smoked. She has never used smokeless tobacco. She reports that she does not drink alcohol and does not use drugs.  Allergies: No Known Allergies  Medications Prior to Admission  Medication Sig Dispense Refill   atorvastatin  (LIPITOR) 40 MG tablet Take 1 tablet (40 mg total) by mouth daily. 90 tablet 3   empagliflozin  (JARDIANCE ) 10 MG TABS tablet Take 1 tablet (10 mg total) by mouth daily before breakfast. 90 tablet 1   enalapril  (VASOTEC ) 2.5 MG tablet TAKE 1 TABLET(2.5 MG) BY MOUTH DAILY 90 tablet 3   Insulin  Glargine (BASAGLAR  KWIKPEN) 100 UNIT/ML Inject 22 Units into the skin daily. 15 mL 2   insulin  lispro (HUMALOG  KWIKPEN) 200 UNIT/ML KwikPen Inject 8 Units into the skin at bedtime.     liraglutide  (VICTOZA ) 18 MG/3ML SOPN Inject 1.8 mg into the skin daily. (Patient taking differently: Inject 1.2 mg into the skin daily.) 9 mL 2   metFORMIN  (GLUCOPHAGE -XR) 500 MG 24 hr tablet Take 2 tablets (1,000 mg total)  by mouth 2 (two) times daily with a meal. 360 tablet 2   blood glucose meter kit and supplies KIT Dispense based on patient and insurance preference. Use up to four times daily as directed. 1 each 0   Continuous Glucose Sensor (FREESTYLE LIBRE 3 PLUS SENSOR) MISC CHANGE SENSOR EVERY 15 DAYS. 2 each 3   insulin  aspart (NOVOLOG  FLEXPEN) 100 UNIT/ML FlexPen Inject 8 Units into the skin daily before supper. (Patient not taking: Reported on 08/01/2023) 15 mL 1   Insulin  Pen Needle (BD PEN NEEDLE NANO 2ND GEN) 32G X 4 MM MISC as directed 100 each 3    Results for orders placed or performed during the hospital encounter of 08/04/23 (from the past 48 hours)  Glucose, capillary     Status: Abnormal   Collection Time: 08/04/23  1:22 PM  Result Value Ref Range   Glucose-Capillary 120 (H) 70 - 99 mg/dL    Comment: Glucose reference range  applies only to samples taken after fasting for at least 8 hours.   Comment 1 Notify RN    No results found.  Review of Systems  Musculoskeletal:  Positive for arthralgias.  All other systems reviewed and are negative.   Blood pressure (!) 165/89, pulse (!) 113, temperature 98 F (36.7 C), temperature source Oral, resp. rate 16, height 5\' 1"  (1.549 m), weight 67.2 kg, last menstrual period 07/06/2023, SpO2 100%, not currently breastfeeding. Physical Exam Vitals reviewed.  HENT:     Head: Normocephalic.     Nose: Nose normal.     Mouth/Throat:     Mouth: Mucous membranes are moist.  Eyes:     Pupils: Pupils are equal, round, and reactive to light.  Cardiovascular:     Rate and Rhythm: Normal rate.     Pulses: Normal pulses.  Pulmonary:     Effort: Pulmonary effort is normal.  Abdominal:     General: Abdomen is flat.  Musculoskeletal:     Cervical back: Normal range of motion.  Skin:    General: Skin is warm.     Capillary Refill: Capillary refill takes less than 2 seconds.  Neurological:     General: No focal deficit present.     Mental Status: She is alert.  Psychiatric:        Mood and Affect: Mood normal.     Ortho exam demonstrates range of motion on the left of 15/45/85.  Rotator cuff strength intact infraspinatus supraspinatus and subscap muscle testing.  5 out of 5 grip EPL FPL interosseous resection extension bicep tricep and deltoid strength.  Cervical spine range of motion is full.  Does have some paresthesias up in the [place] region around her neck compared to the right-hand side left versus right.  Deltoid is functional.  No coarse grinding or popping with passive range of motion of that left shoulder.  No other masses lymphadenopathy or skin changes noted in that shoulder girdle region.   Assessment/Plan Impression is adhesive capsulitis of the left shoulder.  This has been going on for about a year.  Cervical spine MRI negative for left-sided radiculopathy source.   Patient has failed conservative measures including therapy exercises as well as injection.  Patient has significant limitation of passive range of motion consistent with frozen shoulder.  Rotator cuff strength is intact.  Plan at this time is shoulder manipulation under anesthesia with rotator interval release.  Postop shoulder CPM machine is prohibitively expensive for this patient.  We will try to get the patient  into physical therapy tomorrow and I showed them multiple sets of exercises to do in order to maintain the motion that we get today.  That would include pendulum exercises along with active assisted motion of abduction and forward flexion  using the right arm to assist the left arm.  The risk and benefits of the procedure discussed with the patient include not limited to infection nerve or vessel damage fracture along with recurrent shoulder stiffness.  The importance of doing the exercises is emphasized.  We will discontinue the sling once the shoulder block wears off.  Marykay Snipes, MD 08/04/2023, 3:03 PM

## 2023-08-04 NOTE — Anesthesia Preprocedure Evaluation (Addendum)
 Anesthesia Evaluation  Patient identified by MRN, date of birth, ID band Patient awake    Reviewed: Allergy & Precautions, NPO status , Patient's Chart, lab work & pertinent test results, reviewed documented beta blocker date and time   Airway Mallampati: I  TM Distance: >3 FB     Dental no notable dental hx. (+) Dental Advisory Given, Teeth Intact   Pulmonary neg pulmonary ROS   Pulmonary exam normal breath sounds clear to auscultation       Cardiovascular hypertension, Pt. on medications Normal cardiovascular exam Rhythm:Regular Rate:Normal     Neuro/Psych negative neurological ROS  negative psych ROS   GI/Hepatic negative GI ROS, Neg liver ROS,,,  Endo/Other  diabetes, Well Controlled, Type 2  GLP-1 RA therapy- last dose yesterday HLD  Renal/GU negative Renal ROS  negative genitourinary   Musculoskeletal  (+) Arthritis , Osteoarthritis,  Adhesive capsulitis left shoulder   Abdominal   Peds  Hematology  (+) Blood dyscrasia, anemia   Anesthesia Other Findings   Reproductive/Obstetrics                             Anesthesia Physical Anesthesia Plan  ASA: 2  Anesthesia Plan: General   Post-op Pain Management: Regional block* and Minimal or no pain anticipated   Induction: Intravenous  PONV Risk Score and Plan: 4 or greater and Treatment may vary due to age or medical condition, Midazolam , Ondansetron  and Dexamethasone   Airway Management Planned: Oral ETT  Additional Equipment: None  Intra-op Plan:   Post-operative Plan: Extubation in OR  Informed Consent: I have reviewed the patients History and Physical, chart, labs and discussed the procedure including the risks, benefits and alternatives for the proposed anesthesia with the patient or authorized representative who has indicated his/her understanding and acceptance.     Dental advisory given  Plan Discussed with: CRNA  and Anesthesiologist  Anesthesia Plan Comments:         Anesthesia Quick Evaluation

## 2023-08-05 ENCOUNTER — Ambulatory Visit: Admitting: Rehabilitative and Restorative Service Providers"

## 2023-08-05 ENCOUNTER — Encounter: Payer: Self-pay | Admitting: Rehabilitative and Restorative Service Providers"

## 2023-08-05 DIAGNOSIS — G8929 Other chronic pain: Secondary | ICD-10-CM

## 2023-08-05 DIAGNOSIS — R293 Abnormal posture: Secondary | ICD-10-CM | POA: Diagnosis not present

## 2023-08-05 DIAGNOSIS — M25612 Stiffness of left shoulder, not elsewhere classified: Secondary | ICD-10-CM

## 2023-08-05 DIAGNOSIS — M25512 Pain in left shoulder: Secondary | ICD-10-CM

## 2023-08-05 DIAGNOSIS — M6281 Muscle weakness (generalized): Secondary | ICD-10-CM

## 2023-08-05 DIAGNOSIS — R600 Localized edema: Secondary | ICD-10-CM

## 2023-08-05 NOTE — Anesthesia Postprocedure Evaluation (Signed)
 Anesthesia Post Note  Patient: [name]  Procedure(s) Performed: LEFT SHOULDER ARTHROSCOPY WITH EXTENSIVE DEBRIDEMENT (Left: Shoulder) LEFT SHOULDER MANIPULATION WITH ANESTHESIA (Left: Shoulder) LEFT OPEN BICEPS TENODESIS (Left: Shoulder)     Patient location during evaluation: PACU Anesthesia Type: General Level of consciousness: awake and alert Pain management: pain level controlled Vital Signs Assessment: post-procedure vital signs reviewed and stable Respiratory status: spontaneous breathing, nonlabored ventilation and respiratory function stable Cardiovascular status: blood pressure returned to baseline and stable Postop Assessment: no apparent nausea or vomiting Anesthetic complications: no   No notable events documented.  Last Vitals:  Vitals:   08/04/23 1800 08/04/23 1815  BP: 136/83 131/83  Pulse: (!) 108 (!) 108  Resp: 20 20  Temp:  36.7 C  SpO2: 98% 98%    Last Pain:  Vitals:   08/04/23 1730  TempSrc:   PainSc: 0-No pain                 Erin Havers

## 2023-08-06 DIAGNOSIS — M65911 Unspecified synovitis and tenosynovitis, right shoulder: Secondary | ICD-10-CM

## 2023-08-06 DIAGNOSIS — M7521 Bicipital tendinitis, right shoulder: Secondary | ICD-10-CM

## 2023-08-06 DIAGNOSIS — S43431A Superior glenoid labrum lesion of right shoulder, initial encounter: Secondary | ICD-10-CM

## 2023-08-06 DIAGNOSIS — M7501 Adhesive capsulitis of right shoulder: Secondary | ICD-10-CM

## 2023-08-11 ENCOUNTER — Ambulatory Visit: Admitting: Rehabilitative and Restorative Service Providers"

## 2023-08-11 ENCOUNTER — Encounter: Payer: Self-pay | Admitting: Rehabilitative and Restorative Service Providers"

## 2023-08-11 DIAGNOSIS — M25512 Pain in left shoulder: Secondary | ICD-10-CM | POA: Diagnosis not present

## 2023-08-11 DIAGNOSIS — M6281 Muscle weakness (generalized): Secondary | ICD-10-CM

## 2023-08-11 DIAGNOSIS — G8929 Other chronic pain: Secondary | ICD-10-CM

## 2023-08-11 DIAGNOSIS — R600 Localized edema: Secondary | ICD-10-CM

## 2023-08-11 DIAGNOSIS — R293 Abnormal posture: Secondary | ICD-10-CM | POA: Diagnosis not present

## 2023-08-11 DIAGNOSIS — M25612 Stiffness of left shoulder, not elsewhere classified: Secondary | ICD-10-CM

## 2023-08-11 NOTE — Therapy (Signed)
 OUTPATIENT PHYSICAL THERAPY SHOULDER TREATMENT   Patient Name: Eanna Kulig MRN: [id number] DOB:Jan 19, 1976, 48 y.o., female Today's Date: 08/11/2023  END OF SESSION:  PT End of Session - 08/11/23 1512     Visit Number 2    Number of Visits 24    Date for PT Re-Evaluation 10/28/23    Authorization Type AETNA    PT Start Time 1512    PT Stop Time 1555    PT Time Calculation (min) 43 min    Activity Tolerance Patient tolerated treatment well;No increased pain;Patient limited by pain    Behavior During Therapy [facility] for tasks assessed/performed              Past Medical History:  Diagnosis Date   DM type 2 (diabetes mellitus, type 2) (HCC) 07/13/2011   Gestational diabetes    With first pregnancy, glyburide    HTN complicating peripregnancy, antepartum, third trimester 10/11/2016   Obesity (BMI 30-39.9)    Pregnancy induced hypertension    Uterine scar from previous cesarean delivery affecting pregnancy 10/11/2016   Past Surgical History:  Procedure Laterality Date   BICEPT TENODESIS Left 08/04/2023   Procedure: LEFT OPEN BICEPS TENODESIS;  Surgeon: Jasmine Mesi, MD;  Location: [facility] OR;  Service: Orthopedics;  Laterality: Left;  OPEN BICEPS TENODESIS   CESAREAN SECTION N/A 04/10/2013   Procedure: CESAREAN SECTION;  Surgeon: Ana Balling, MD;  Location: [facility];  Service: Obstetrics;  Laterality: N/A;   CESAREAN SECTION N/A 10/11/2016   Procedure: CESAREAN SECTION;  Surgeon: Margaretmary Shaver, MD;  Location: [facility];  Service: Obstetrics;  Laterality: N/A;  [name], RNFA   PERINEUM REPAIR N/A 04/10/2013   Procedure: EPISIOTOMY REPAIR;  Surgeon: Ana Balling, MD;  Location: [facility];  Service: Obstetrics;  Laterality: N/A;   POSTERIOR LUMBAR FUSION 2 WITH HARDWARE REMOVAL Left 08/04/2023   Procedure: LEFT SHOULDER ARTHROSCOPY WITH EXTENSIVE DEBRIDEMENT;  Surgeon: Jasmine Mesi, MD;  Location: [facility] OR;  Service: Orthopedics;  Laterality: Left;  LEFT SHOULDER MANIPULATION  UNDER ANESTHESIA, EXTENSIVE DEBRIDEMENT   SHOULDER CLOSED REDUCTION Left 08/04/2023   Procedure: LEFT SHOULDER MANIPULATION WITH ANESTHESIA;  Surgeon: Jasmine Mesi, MD;  Location: [facility] OR;  Service: Orthopedics;  Laterality: Left;   Patient Active Problem List   Diagnosis Date Noted   Biceps tendonitis on right 08/06/2023   Degenerative superior labral anterior-to-posterior (SLAP) tear of right shoulder 08/06/2023   Synovitis of right shoulder 08/06/2023   Adhesive capsulitis of right shoulder 08/06/2023   Adhesive capsulitis of left shoulder 03/01/2023   Dyspepsia 12/23/2021   Peeling skin 04/07/2021   Healthcare maintenance 12/22/2018   Hyperlipemia 08/26/2014   Microalbuminuria 08/26/2014   HTN (hypertension) 06/21/2013   Type 2 diabetes mellitus with microalbuminuria, without long-term current use of insulin  (HCC) 07/13/2011    PCP: Abraham Abo, MD  REFERRING PROVIDER: Jasmine Mesi, MD  REFERRING DIAG: M75.02 (ICD-10-CM) - Adhesive capsulitis of left shoulde  THERAPY DIAG:  Abnormal posture  Muscle weakness (generalized)  Localized edema  Chronic left shoulder pain  Stiffness of left shoulder, not elsewhere classified  Rationale for Evaluation and Treatment: Rehabilitation  ONSET DATE: 08/04/2023 manipulation under anesthesia with arthroscopy, extensive debridement, rotator interval release and biceps tenodesis, history of 1.5 years  SUBJECTIVE:  SUBJECTIVE STATEMENT: Kekoa reports good HEP compliance.  Her left arm is no longer "dead" from the anesthesia.  Laverna had a manipulation yesterday.  1.5 year history of left shoulder pain.  Hand dominance: Left  PERTINENT HISTORY: Type 2 DM  PAIN:  Are you having pain? Yes: NPRS scale: 3-4/10 this week Pain location: Left  shoulder Pain description: Ache, sore Aggravating factors: Use Relieving factors: NA  PRECAUTIONS: Other: Post-surgical/manipulation shoulder  RED FLAGS: None   WEIGHT BEARING RESTRICTIONS: No  FALLS:  Has patient fallen in last 6 months? No  LIVING ENVIRONMENT: Lives with: lives with their family, lives with their spouse, lives with their daughter, and and mother Lives in: House/apartment Stairs:  No problem Has following equipment at home: None  OCCUPATION: Nail tech  PLOF: Independent  PATIENT GOALS:Return to normal function as quickly as possible  NEXT MD VISIT:   OBJECTIVE:  Note: Objective measures were completed at Evaluation unless otherwise noted.  DIAGNOSTIC FINDINGS:  Radiographs of her left shoulder demonstrate no acute fractures no  significant degenerative changes  PATIENT SURVEYS:   Patient specific functional scale (0 unable to 10 no difficulty) 1) Reaching   0/10 2) Wash/style hair  0/10 3) Reach overhead 0/10 Total    0/30 or 0%  COGNITION: Overall cognitive status: Within functional limits for tasks assessed     SENSATION: Still feeling anesthesia  POSTURE: Rounded shoulders and forward head in the sling  UPPER EXTREMITY ROM:   Passive ROM Left/Right 08/04/2023 Left 08/11/2023  Shoulder flexion 150/170 140  Shoulder extension    Shoulder abduction    Shoulder horizontal adduction 35/40 35  Shoulder internal rotation 70/55 50  Shoulder external rotation 60/105 50  Elbow flexion    Elbow extension    Wrist flexion    Wrist extension    Wrist ulnar deviation    Wrist radial deviation    Wrist pronation    Wrist supination    (Blank rows = not tested)  UPPER EXTREMITY STRENGTH: Deferred due to surgery/manipulation yesterday evening  Assessed in pounds with hand-held dynamometer Left/Right 08/05/2023   Shoulder flexion    Shoulder extension    Shoulder abduction    Shoulder adduction    Shoulder internal rotation    Shoulder  external rotation    Middle trapezius    Lower trapezius    Elbow flexion    Elbow extension    Wrist flexion    Wrist extension    Wrist ulnar deviation    Wrist radial deviation    Wrist pronation    Wrist supination    Grip strength (lbs)    (Blank rows = not tested)                                                                                                                            TREATMENT DATE:  08/11/2023 Supine IR/ER AROM with PT assist 20 x 10 seconds in each direction (70 degrees abduction,  elbow slightly elevated vs shoulder height)  Functional Activities: Supine arm raises/scapular protraction 20 x 3 seconds (palms facing in) Pulley flexion and scaption with decompression 10 x 10 seconds each Updated and reviewed HEP   08/05/2023 Codman's/pendulum: forward and back; side-to-side; clockwise and counterclockwise 20 times each (cues to keep her torso is close to parallel to the floor as possible and relax the shoulder to maximize left shoulder range of motion) Pulley protraction with flexion active assisted range of motion with physical therapist assistance for protraction and grip on the pulley secondary to the nerve block still affecting left upper extremity function 10 x 5 seconds  Reviewed exam findings and day 1 home exercise program  PATIENT EDUCATION: Education details: See above Person educated: Patient and Spouse Education method: Explanation, Demonstration, Tactile cues, Verbal cues, and Handouts Education comprehension: verbalized understanding, returned demonstration, verbal cues required, tactile cues required, and needs further education  HOME EXERCISE PROGRAM: Access Code: [email or web address] URL: [email or web address] Date: 08/11/2023 Prepared by: Terral Ferrari  Exercises - Seated Shoulder Flexion AAROM with Pulley Behind  - 1 x daily - 7 x weekly - 1 sets - 10 reps - 10 seconds hold - Supine Scapular Protraction in Flexion with Dumbbells  -  2-3 x daily - 7 x weekly - 1 sets - 20 reps - 3 seconds hold - Supine Shoulder External Rotation Stretch  - 2-3 x daily - 7 x weekly - 1 sets - 10-20 reps - 10 seconds hold - Supine Shoulder Internal Rotation Stretch  - 2-3 x daily - 7 x weekly - 1 sets - 10-20 reps - 10 seconds hold  ASSESSMENT:  CLINICAL IMPRESSION: Denali was a bit more stiff today as compared to evaluation.  This is likely due to to me seeing her less than 24 hours after her surgical manipulation and her nerve block was still affecting her left shoulder at that time.  We updated her home exercise program to be a bit more aggressive with capsular flexibility now that she has full feeling back in her left arm.  Brantleigh did a good job with her home exercises today and was able to demonstrate competence that she should be able to repeat at home for continued active range of motion, capsular flexibility and functional gains.  Patient is a 48 y.o. female who was seen today for physical therapy evaluation and treatment for s/p left shoulder manipulation under anesthesia with arthroscopy, extensive debridement, rotator interval release and mini-open biceps tenodesis.  Rynlee had her procedure last night and she is still feeling the effects of the nerve block with her left upper extremity.  Early emphasis will be on range of motion to make sure she does not freeze back up regards to her range of motion.  Appropriate scapular and rotator cuff strengthening will be implemented as appropriate to facilitate meeting the below listed long-term goals.  OBJECTIVE IMPAIRMENTS: decreased activity tolerance, decreased endurance, decreased knowledge of condition, decreased ROM, decreased strength, increased edema, impaired perceived functional ability, impaired sensation, impaired UE functional use, and pain.   ACTIVITY LIMITATIONS: carrying, lifting, sleeping, dressing, and reach over head  PARTICIPATION LIMITATIONS: meal prep, cleaning, laundry, driving,  shopping, community activity, and occupation  PERSONAL FACTORS: Type 2 DM is also affecting patient's functional outcome.   REHAB POTENTIAL: Good  CLINICAL DECISION MAKING: Stable/uncomplicated  EVALUATION COMPLEXITY: Low   GOALS: Goals reviewed with patient? Yes  SHORT TERM GOALS: Target date: 09/16/2023  Niya will be independent with her day 1 home exercise  program Baseline: Started 08/05/2023 Goal status: On Going 08/11/2023  2.  Improve left shoulder active range of motion for flexion to 150 degrees; IR to 70 degrees; ER to 70 degrees and horizontal adduction to 40 degrees Baseline: Passive range of motion 150; 70; 60 and 35 respectively Goal status: On Going 08/11/2023   LONG TERM GOALS: Target date: 10/28/2023  Improve patient specific functional score to at least 70% Baseline: 0% Goal status: INITIAL  2.  Sennie will report left shoulder pain consistently 0-3/10 on numeric pain rating scale Baseline: Left upper extremity was completely numb due to a nerve block at evaluation Goal status: On Going 08/11/2023  3.  Improve left shoulder active range of motion to 90% of flexion at 170; IR at 60; ER to 70 and horizontal adduction to 40 degrees Baseline: See objective Goal status: INITIAL  4.  Improve left shoulder strength to at least 60% of the uninvolved right Baseline: Strength testing deferred at evaluation secondary to her procedure being less than 24 hours ago Goal status: INITIAL  5.  Daniele will be independent with her long-term home maintenance exercise program at discharge Baseline: Started 08/05/2023 Goal status: INITIAL  PLAN:  PT FREQUENCY: 2-3 times a week  PT DURATION: 12 weeks  PLANNED INTERVENTIONS: 97110-Therapeutic exercises, 97530- Therapeutic activity, 97112- Neuromuscular re-education, 97535- Self Care, 16109- Manual therapy, 97016- Vasopneumatic device, Patient/Family education, Joint mobilization, Cryotherapy, and Moist heat  PLAN FOR NEXT SESSION:  Active assistive and active range of motion activities.  Early emphasis on maintaining and improving active range of motion as her biggest issue presurgery was a severe adhesive capsulitis.  Scapular and rotator cuff strengthening as AROM and capsular flexibility improve.   Joli Neas, PT, MPT 08/11/2023, 5:02 PM

## 2023-08-12 ENCOUNTER — Ambulatory Visit (INDEPENDENT_AMBULATORY_CARE_PROVIDER_SITE_OTHER): Admitting: Surgical

## 2023-08-12 ENCOUNTER — Encounter: Admitting: Physical Therapy

## 2023-08-12 DIAGNOSIS — M7502 Adhesive capsulitis of left shoulder: Secondary | ICD-10-CM

## 2023-08-14 ENCOUNTER — Encounter: Payer: Self-pay | Admitting: Surgical

## 2023-08-14 NOTE — Progress Notes (Signed)
 Post-Op Visit Note   Patient: [name]           Date of Birth: [birth year]           MRN: [id number] Visit Date: 08/12/2023 PCP: Abraham Abo, MD   Assessment & Plan:  Chief Complaint:  Chief Complaint  Patient presents with   Left Shoulder - Routine Post Op, Follow-up    08/04/2023 left shoulder arthroscopy with extensive debridement   Visit Diagnoses:  1. Adhesive capsulitis of left shoulder     Plan: [name] is a 48 y.o. female who presents s/p left shoulder manipulation under anesthesia and biceps tenodesis on 08/04/2023.  Patient is doing well and pain is overall controlled.  Has had 2 sessions with physical therapy already primarily working on range of motion.  Denies any chest pain, SOB, fevers, chills.  No longer taking oxycodone .  Just taking Celebrex  and Robaxin .  On exam, patient has range of motion 20 degrees X rotation, 70 degrees abduction, 130 degrees forward elevation passively.  Intact EPL, FPL, finger abduction, finger adduction, pronation/supination, bicep, tricep, deltoid of operative extremity.  Axillary nerve intact with deltoid firing.  Incisions are healing well without evidence of infection or dehiscence.  Sutures removed and replaced with Steri-Strips today.  2+ radial pulse of the operative extremity  Plan is continue with physical therapy to focus primarily on range of motion exercises.  Due to the bicep tenodesis, hold off on any significant strengthening exercises until after next appointment.  Follow-up in 4 weeks for clinical recheck with Dr. Rozelle Corning..   Follow-Up Instructions: No follow-ups on file.   Orders:  No orders of the defined types were placed in this encounter.  No orders of the defined types were placed in this encounter.   Imaging: No results found.  PMFS History: Patient Active Problem List   Diagnosis Date Noted   Biceps tendonitis on right 08/06/2023   Degenerative superior labral anterior-to-posterior (SLAP) tear of right  shoulder 08/06/2023   Synovitis of right shoulder 08/06/2023   Adhesive capsulitis of right shoulder 08/06/2023   Adhesive capsulitis of left shoulder 03/01/2023   Dyspepsia 12/23/2021   Peeling skin 04/07/2021   Healthcare maintenance 12/22/2018   Hyperlipemia 08/26/2014   Microalbuminuria 08/26/2014   HTN (hypertension) 06/21/2013   Type 2 diabetes mellitus with microalbuminuria, without long-term current use of insulin  (HCC) 07/13/2011   Past Medical History:  Diagnosis Date   DM type 2 (diabetes mellitus, type 2) (HCC) 07/13/2011   Gestational diabetes    With first pregnancy, glyburide    HTN complicating peripregnancy, antepartum, third trimester 10/11/2016   Obesity (BMI 30-39.9)    Pregnancy induced hypertension    Uterine scar from previous cesarean delivery affecting pregnancy 10/11/2016    Family History  Problem Relation Age of Onset   Cancer Father    Alcohol abuse Neg Hx    Arthritis Neg Hx    Asthma Neg Hx    Birth defects Neg Hx    COPD Neg Hx    Depression Neg Hx    Diabetes Neg Hx    Drug abuse Neg Hx    Early death Neg Hx    Hearing loss Neg Hx    Heart disease Neg Hx    Hyperlipidemia Neg Hx    Hypertension Neg Hx    Kidney disease Neg Hx    Learning disabilities Neg Hx    Mental illness Neg Hx    Mental retardation Neg Hx    Miscarriages /  Stillbirths Neg Hx    Stroke Neg Hx    Vision loss Neg Hx    Varicose Veins Neg Hx    Breast cancer Neg Hx     Past Surgical History:  Procedure Laterality Date   BICEPT TENODESIS Left 08/04/2023   Procedure: LEFT OPEN BICEPS TENODESIS;  Surgeon: Jasmine Mesi, MD;  Location: [facility] OR;  Service: Orthopedics;  Laterality: Left;  OPEN BICEPS TENODESIS   CESAREAN SECTION N/A 04/10/2013   Procedure: CESAREAN SECTION;  Surgeon: Ana Balling, MD;  Location: [facility];  Service: Obstetrics;  Laterality: N/A;   CESAREAN SECTION N/A 10/11/2016   Procedure: CESAREAN SECTION;  Surgeon: Margaretmary Shaver, MD;  Location: [facility];  Service: Obstetrics;  Laterality: N/A;  [name], RNFA   PERINEUM REPAIR N/A 04/10/2013   Procedure: EPISIOTOMY REPAIR;  Surgeon: Ana Balling, MD;  Location: [facility];  Service: Obstetrics;  Laterality: N/A;   POSTERIOR LUMBAR FUSION 2 WITH HARDWARE REMOVAL Left 08/04/2023   Procedure: LEFT SHOULDER ARTHROSCOPY WITH EXTENSIVE DEBRIDEMENT;  Surgeon: Jasmine Mesi, MD;  Location: [facility] OR;  Service: Orthopedics;  Laterality: Left;  LEFT SHOULDER MANIPULATION UNDER ANESTHESIA, EXTENSIVE DEBRIDEMENT   SHOULDER CLOSED REDUCTION Left 08/04/2023   Procedure: LEFT SHOULDER MANIPULATION WITH ANESTHESIA;  Surgeon: Jasmine Mesi, MD;  Location: [facility] OR;  Service: Orthopedics;  Laterality: Left;   Social History   Occupational History   Not on file  Tobacco Use   Smoking status: Never   Smokeless tobacco: Never  Vaping Use   Vaping status: Never Used  Substance and Sexual Activity   Alcohol use: No   Drug use: No   Sexual activity: Yes    Birth control/protection: None

## 2023-08-19 ENCOUNTER — Ambulatory Visit: Admitting: Physical Therapy

## 2023-08-19 DIAGNOSIS — R600 Localized edema: Secondary | ICD-10-CM | POA: Diagnosis not present

## 2023-08-19 DIAGNOSIS — M6281 Muscle weakness (generalized): Secondary | ICD-10-CM

## 2023-08-19 DIAGNOSIS — R293 Abnormal posture: Secondary | ICD-10-CM | POA: Diagnosis not present

## 2023-08-19 DIAGNOSIS — M25612 Stiffness of left shoulder, not elsewhere classified: Secondary | ICD-10-CM

## 2023-08-19 DIAGNOSIS — M25512 Pain in left shoulder: Secondary | ICD-10-CM | POA: Diagnosis not present

## 2023-08-19 DIAGNOSIS — G8929 Other chronic pain: Secondary | ICD-10-CM

## 2023-08-19 NOTE — Therapy (Signed)
 OUTPATIENT PHYSICAL THERAPY SHOULDER TREATMENT   Patient Name: [name] MRN: [id number] DOB:Sep 30, 1975, 48 y.o., female Today's Date: 08/19/2023  END OF SESSION:  PT End of Session - 08/19/23 1424     Visit Number 3    Number of Visits 24    Date for PT Re-Evaluation 10/28/23    Authorization Type AETNA    PT Start Time 1430    PT Stop Time 1510    PT Time Calculation (min) 40 min    Activity Tolerance Patient tolerated treatment well;No increased pain;Patient limited by pain    Behavior During Therapy [facility] for tasks assessed/performed              Past Medical History:  Diagnosis Date   DM type 2 (diabetes mellitus, type 2) (HCC) 07/13/2011   Gestational diabetes    With first pregnancy, glyburide    HTN complicating peripregnancy, antepartum, third trimester 10/11/2016   Obesity (BMI 30-39.9)    Pregnancy induced hypertension    Uterine scar from previous cesarean delivery affecting pregnancy 10/11/2016   Past Surgical History:  Procedure Laterality Date   BICEPT TENODESIS Left 08/04/2023   Procedure: LEFT OPEN BICEPS TENODESIS;  Surgeon: Jasmine Mesi, MD;  Location: [facility] OR;  Service: Orthopedics;  Laterality: Left;  OPEN BICEPS TENODESIS   CESAREAN SECTION N/A 04/10/2013   Procedure: CESAREAN SECTION;  Surgeon: Ana Balling, MD;  Location: [facility];  Service: Obstetrics;  Laterality: N/A;   CESAREAN SECTION N/A 10/11/2016   Procedure: CESAREAN SECTION;  Surgeon: Margaretmary Shaver, MD;  Location: [facility];  Service: Obstetrics;  Laterality: N/A;  [name], RNFA   PERINEUM REPAIR N/A 04/10/2013   Procedure: EPISIOTOMY REPAIR;  Surgeon: Ana Balling, MD;  Location: [facility];  Service: Obstetrics;  Laterality: N/A;   POSTERIOR LUMBAR FUSION 2 WITH HARDWARE REMOVAL Left 08/04/2023   Procedure: LEFT SHOULDER ARTHROSCOPY WITH EXTENSIVE DEBRIDEMENT;  Surgeon: Jasmine Mesi, MD;  Location: [facility] OR;  Service: Orthopedics;  Laterality: Left;  LEFT SHOULDER MANIPULATION  UNDER ANESTHESIA, EXTENSIVE DEBRIDEMENT   SHOULDER CLOSED REDUCTION Left 08/04/2023   Procedure: LEFT SHOULDER MANIPULATION WITH ANESTHESIA;  Surgeon: Jasmine Mesi, MD;  Location: [facility] OR;  Service: Orthopedics;  Laterality: Left;   Patient Active Problem List   Diagnosis Date Noted   Biceps tendonitis on right 08/06/2023   Degenerative superior labral anterior-to-posterior (SLAP) tear of right shoulder 08/06/2023   Synovitis of right shoulder 08/06/2023   Adhesive capsulitis of right shoulder 08/06/2023   Adhesive capsulitis of left shoulder 03/01/2023   Dyspepsia 12/23/2021   Peeling skin 04/07/2021   Healthcare maintenance 12/22/2018   Hyperlipemia 08/26/2014   Microalbuminuria 08/26/2014   HTN (hypertension) 06/21/2013   Type 2 diabetes mellitus with microalbuminuria, without long-term current use of insulin  (HCC) 07/13/2011    PCP: Abraham Abo, MD  REFERRING PROVIDER: Jasmine Mesi, MD  REFERRING DIAG: M75.02 (ICD-10-CM) - Adhesive capsulitis of left shoulde  THERAPY DIAG:  No diagnosis found.  Rationale for Evaluation and Treatment: Rehabilitation  ONSET DATE: 08/04/2023 manipulation under anesthesia with arthroscopy, extensive debridement, rotator interval release and biceps tenodesis, history of 1.5 years  SUBJECTIVE:  SUBJECTIVE STATEMENT: Pt reports more tightness than pain.   Hand dominance: Left  PERTINENT HISTORY: Type 2 DM  PAIN:  Are you having pain? Yes: NPRS scale: 3-4/10 this week Pain location: Left shoulder Pain description: Ache, sore Aggravating factors: Use Relieving factors: NA  PRECAUTIONS: Other: Post-surgical/manipulation shoulder  RED FLAGS: None   WEIGHT BEARING RESTRICTIONS: No  FALLS:  Has patient fallen in last 6 months? No  LIVING  ENVIRONMENT: Lives with: lives with their family, lives with their spouse, lives with their daughter, and and mother Lives in: House/apartment Stairs: No problem Has following equipment at home: None  OCCUPATION: Nail tech  PLOF: Independent  PATIENT GOALS:Return to normal function as quickly as possible  NEXT MD VISIT:   OBJECTIVE:  Note: Objective measures were completed at Evaluation unless otherwise noted.  DIAGNOSTIC FINDINGS:  Radiographs of her left shoulder demonstrate no acute fractures no  significant degenerative changes  PATIENT SURVEYS:   Patient specific functional scale (0 unable to 10 no difficulty) 1) Reaching   0/10 2) Wash/style hair  0/10 3) Reach overhead 0/10 Total    0/30 or 0%  COGNITION: Overall cognitive status: Within functional limits for tasks assessed     SENSATION: Still feeling anesthesia  POSTURE: Rounded shoulders and forward head in the sling  UPPER EXTREMITY ROM:   Passive ROM Left/Right 08/04/2023 Left 08/11/2023  Shoulder flexion 150/170 140  Shoulder extension    Shoulder abduction    Shoulder horizontal adduction 35/40 35  Shoulder internal rotation 70/55 50  Shoulder external rotation 60/105 50  Elbow flexion    Elbow extension    Wrist flexion    Wrist extension    Wrist ulnar deviation    Wrist radial deviation    Wrist pronation    Wrist supination    (Blank rows = not tested)  UPPER EXTREMITY STRENGTH: Deferred due to surgery/manipulation yesterday evening  Assessed in pounds with hand-held dynamometer Left/Right 08/05/2023   Shoulder flexion    Shoulder extension    Shoulder abduction    Shoulder adduction    Shoulder internal rotation    Shoulder external rotation    Middle trapezius    Lower trapezius    Elbow flexion    Elbow extension    Wrist flexion    Wrist extension    Wrist ulnar deviation    Wrist radial deviation    Wrist pronation    Wrist supination    Grip strength (lbs)    (Blank rows  = not tested)                                                                                                                            TREATMENT DATE:  08/19/2023 Therapeutic exercise/therapeutic activity: Supine shoulder PROM each direction Supine shoulder flexion AAROM 1# WaTE bar x10 Supine shoulder ER at ~45 deg abd, AAROM with 1# WaTE bar x10 Supine shoulder ER at ~80 deg abd AAROM with 1# WaTE  bar x10 Supine hands behind head stretch x 1 min, with pulsating elbows apart x30 sec Prone "W" x10 Prone row 2x10 Standing finger ladder flexion 5x10" Standing finger ladder scaption 5x10"  Manual therapy: Grade II to III GH mobs in every direction  08/11/2023 Supine IR/ER AROM with PT assist 20 x 10 seconds in each direction (70 degrees abduction, elbow slightly elevated vs shoulder height)  Functional Activities: Supine arm raises/scapular protraction 20 x 3 seconds (palms facing in) Pulley flexion and scaption with decompression 10 x 10 seconds each Updated and reviewed HEP   08/05/2023 Codman's/pendulum: forward and back; side-to-side; clockwise and counterclockwise 20 times each (cues to keep her torso is close to parallel to the floor as possible and relax the shoulder to maximize left shoulder range of motion) Pulley protraction with flexion active assisted range of motion with physical therapist assistance for protraction and grip on the pulley secondary to the nerve block still affecting left upper extremity function 10 x 5 seconds  Reviewed exam findings and day 1 home exercise program  PATIENT EDUCATION: Education details: See above Person educated: Patient and Spouse Education method: Explanation, Demonstration, Tactile cues, Verbal cues, and Handouts Education comprehension: verbalized understanding, returned demonstration, verbal cues required, tactile cues required, and needs further education  HOME EXERCISE PROGRAM: Access Code: [email or web address] URL:  [email or web address] Date: 08/11/2023 Prepared by: Terral Ferrari  Exercises - Seated Shoulder Flexion AAROM with Pulley Behind  - 1 x daily - 7 x weekly - 1 sets - 10 reps - 10 seconds hold - Supine Scapular Protraction in Flexion with Dumbbells  - 2-3 x daily - 7 x weekly - 1 sets - 20 reps - 3 seconds hold - Supine Shoulder External Rotation Stretch  - 2-3 x daily - 7 x weekly - 1 sets - 10-20 reps - 10 seconds hold - Supine Shoulder Internal Rotation Stretch  - 2-3 x daily - 7 x weekly - 1 sets - 10-20 reps - 10 seconds hold  ASSESSMENT:  CLINICAL IMPRESSION: Continuing to work on shoulder ROM. Gentle GH joint mobilizations performed this session. Remains most limited with rotation.   Patient is a 48 y.o. female who was seen today for physical therapy evaluation and treatment for s/p left shoulder manipulation under anesthesia with arthroscopy, extensive debridement, rotator interval release and mini-open biceps tenodesis.  [place] had her procedure last night and she is still feeling the effects of the nerve block with her left upper extremity.  Early emphasis will be on range of motion to make sure she does not freeze back up regards to her range of motion.  Appropriate scapular and rotator cuff strengthening will be implemented as appropriate to facilitate meeting the below listed long-term goals.  OBJECTIVE IMPAIRMENTS: decreased activity tolerance, decreased endurance, decreased knowledge of condition, decreased ROM, decreased strength, increased edema, impaired perceived functional ability, impaired sensation, impaired UE functional use, and pain.   ACTIVITY LIMITATIONS: carrying, lifting, sleeping, dressing, and reach over head  PARTICIPATION LIMITATIONS: meal prep, cleaning, laundry, driving, shopping, community activity, and occupation  PERSONAL FACTORS: Type 2 DM is also affecting patient's functional outcome.   REHAB POTENTIAL: Good  CLINICAL DECISION MAKING:  Stable/uncomplicated  EVALUATION COMPLEXITY: Low   GOALS: Goals reviewed with patient? Yes  SHORT TERM GOALS: Target date: 09/16/2023  Kdynce will be independent with her day 1 home exercise program Baseline: Started 08/05/2023 Goal status: On Going 08/11/2023  2.  Improve left shoulder active range of motion for flexion to 150 degrees; IR  to 70 degrees; ER to 70 degrees and horizontal adduction to 40 degrees Baseline: Passive range of motion 150; 70; 60 and 35 respectively Goal status: On Going 08/11/2023   LONG TERM GOALS: Target date: 10/28/2023  Improve patient specific functional score to at least 70% Baseline: 0% Goal status: INITIAL  2.  Kaycie will report left shoulder pain consistently 0-3/10 on numeric pain rating scale Baseline: Left upper extremity was completely numb due to a nerve block at evaluation Goal status: On Going 08/11/2023  3.  Improve left shoulder active range of motion to 90% of flexion at 170; IR at 60; ER to 70 and horizontal adduction to 40 degrees Baseline: See objective Goal status: INITIAL  4.  Improve left shoulder strength to at least 60% of the uninvolved right Baseline: Strength testing deferred at evaluation secondary to her procedure being less than 24 hours ago Goal status: INITIAL  5.  Christalynn will be independent with her long-term home maintenance exercise program at discharge Baseline: Started 08/05/2023 Goal status: INITIAL  PLAN:  PT FREQUENCY: 2-3 times a week  PT DURATION: 12 weeks  PLANNED INTERVENTIONS: 97110-Therapeutic exercises, 97530- Therapeutic activity, 97112- Neuromuscular re-education, 97535- Self Care, [id number]- Manual therapy, 97016- Vasopneumatic device, Patient/Family education, Joint mobilization, Cryotherapy, and Moist heat  PLAN FOR NEXT SESSION: Active assistive and active range of motion activities.  Early emphasis on maintaining and improving active range of motion as her biggest issue presurgery was a severe adhesive  capsulitis.  Scapular and rotator cuff strengthening as AROM and capsular flexibility improve.   Taiyana Kissler April Ma L Najai Waszak, PT, DPT 08/19/2023, 2:30 PM

## 2023-08-23 ENCOUNTER — Ambulatory Visit: Admitting: Physical Therapy

## 2023-08-23 DIAGNOSIS — G8929 Other chronic pain: Secondary | ICD-10-CM

## 2023-08-23 DIAGNOSIS — M6281 Muscle weakness (generalized): Secondary | ICD-10-CM

## 2023-08-23 DIAGNOSIS — R600 Localized edema: Secondary | ICD-10-CM | POA: Diagnosis not present

## 2023-08-23 DIAGNOSIS — R293 Abnormal posture: Secondary | ICD-10-CM | POA: Diagnosis not present

## 2023-08-23 DIAGNOSIS — M25512 Pain in left shoulder: Secondary | ICD-10-CM | POA: Diagnosis not present

## 2023-08-23 DIAGNOSIS — M25612 Stiffness of left shoulder, not elsewhere classified: Secondary | ICD-10-CM

## 2023-08-23 NOTE — Therapy (Signed)
 OUTPATIENT PHYSICAL THERAPY SHOULDER TREATMENT   Patient Name: [name] MRN: [id number] DOB:[birth year], 48 y.o., female Today's Date: 08/23/2023  END OF SESSION:  PT End of Session - 08/23/23 1430     Visit Number 4    Number of Visits 24    Date for PT Re-Evaluation 10/28/23    Authorization Type AETNA    PT Start Time 1430    PT Stop Time 1510    PT Time Calculation (min) 40 min    Activity Tolerance Patient tolerated treatment well;No increased pain;Patient limited by pain    Behavior During Therapy [facility] for tasks assessed/performed              Past Medical History:  Diagnosis Date   DM type 2 (diabetes mellitus, type 2) (HCC) 07/13/2011   Gestational diabetes    With first pregnancy, glyburide    HTN complicating peripregnancy, antepartum, third trimester 10/11/2016   Obesity (BMI 30-39.9)    Pregnancy induced hypertension    Uterine scar from previous cesarean delivery affecting pregnancy 10/11/2016   Past Surgical History:  Procedure Laterality Date   BICEPT TENODESIS Left 08/04/2023   Procedure: LEFT OPEN BICEPS TENODESIS;  Surgeon: Jasmine Mesi, MD;  Location: [facility] OR;  Service: Orthopedics;  Laterality: Left;  OPEN BICEPS TENODESIS   CESAREAN SECTION N/A 04/10/2013   Procedure: CESAREAN SECTION;  Surgeon: Ana Balling, MD;  Location: [facility];  Service: Obstetrics;  Laterality: N/A;   CESAREAN SECTION N/A 10/11/2016   Procedure: CESAREAN SECTION;  Surgeon: Margaretmary Shaver, MD;  Location: [facility];  Service: Obstetrics;  Laterality: N/A;  [name], RNFA   PERINEUM REPAIR N/A 04/10/2013   Procedure: EPISIOTOMY REPAIR;  Surgeon: Ana Balling, MD;  Location: [facility];  Service: Obstetrics;  Laterality: N/A;   POSTERIOR LUMBAR FUSION 2 WITH HARDWARE REMOVAL Left 08/04/2023   Procedure: LEFT SHOULDER ARTHROSCOPY WITH EXTENSIVE DEBRIDEMENT;  Surgeon: Jasmine Mesi, MD;  Location: [facility] OR;  Service: Orthopedics;  Laterality: Left;  LEFT SHOULDER MANIPULATION  UNDER ANESTHESIA, EXTENSIVE DEBRIDEMENT   SHOULDER CLOSED REDUCTION Left 08/04/2023   Procedure: LEFT SHOULDER MANIPULATION WITH ANESTHESIA;  Surgeon: Jasmine Mesi, MD;  Location: [facility] OR;  Service: Orthopedics;  Laterality: Left;   Patient Active Problem List   Diagnosis Date Noted   Biceps tendonitis on right 08/06/2023   Degenerative superior labral anterior-to-posterior (SLAP) tear of right shoulder 08/06/2023   Synovitis of right shoulder 08/06/2023   Adhesive capsulitis of right shoulder 08/06/2023   Adhesive capsulitis of left shoulder 03/01/2023   Dyspepsia 12/23/2021   Peeling skin 04/07/2021   Healthcare maintenance 12/22/2018   Hyperlipemia 08/26/2014   Microalbuminuria 08/26/2014   HTN (hypertension) 06/21/2013   Type 2 diabetes mellitus with microalbuminuria, without long-term current use of insulin  (HCC) 07/13/2011    PCP: Abraham Abo, MD  REFERRING PROVIDER: Jasmine Mesi, MD  REFERRING DIAG: M75.02 (ICD-10-CM) - Adhesive capsulitis of left shoulde  THERAPY DIAG:  Abnormal posture  Muscle weakness (generalized)  Localized edema  Chronic left shoulder pain  Stiffness of left shoulder, not elsewhere classified  Rationale for Evaluation and Treatment: Rehabilitation  ONSET DATE: 08/04/2023 manipulation under anesthesia with arthroscopy, extensive debridement, rotator interval release and biceps tenodesis, history of 1.5 years  SUBJECTIVE:  SUBJECTIVE STATEMENT: Pt reports more tightness than pain.   Hand dominance: Left  PERTINENT HISTORY: Type 2 DM  PAIN:  Are you having pain? Yes: NPRS scale: 3-4/10 this week Pain location: Left shoulder Pain description: Ache, sore Aggravating factors: Use Relieving factors: NA  PRECAUTIONS: Other: Post-surgical/manipulation  shoulder  RED FLAGS: None   WEIGHT BEARING RESTRICTIONS: No  FALLS:  Has patient fallen in last 6 months? No  LIVING ENVIRONMENT: Lives with: lives with their family, lives with their spouse, lives with their daughter, and and mother Lives in: House/apartment Stairs: No problem Has following equipment at home: None  OCCUPATION: Nail tech  PLOF: Independent  PATIENT GOALS:Return to normal function as quickly as possible  NEXT MD VISIT:   OBJECTIVE:  Note: Objective measures were completed at Evaluation unless otherwise noted.  DIAGNOSTIC FINDINGS:  Radiographs of her left shoulder demonstrate no acute fractures no  significant degenerative changes  PATIENT SURVEYS:   Patient specific functional scale (0 unable to 10 no difficulty) 1) Reaching   0/10 2) Wash/style hair  0/10 3) Reach overhead 0/10 Total    0/30 or 0%  COGNITION: Overall cognitive status: Within functional limits for tasks assessed     SENSATION: Still feeling anesthesia  POSTURE: Rounded shoulders and forward head in the sling  UPPER EXTREMITY ROM:   Passive ROM Left/Right 08/04/2023 Left 08/11/2023  Shoulder flexion 150/170 140  Shoulder extension    Shoulder abduction    Shoulder horizontal adduction 35/40 35  Shoulder internal rotation 70/55 50  Shoulder external rotation 60/105 50  Elbow flexion    Elbow extension    Wrist flexion    Wrist extension    Wrist ulnar deviation    Wrist radial deviation    Wrist pronation    Wrist supination    (Blank rows = not tested)  UPPER EXTREMITY STRENGTH: Deferred due to surgery/manipulation yesterday evening  Assessed in pounds with hand-held dynamometer Left/Right 08/05/2023   Shoulder flexion    Shoulder extension    Shoulder abduction    Shoulder adduction    Shoulder internal rotation    Shoulder external rotation    Middle trapezius    Lower trapezius    Elbow flexion    Elbow extension    Wrist flexion    Wrist extension     Wrist ulnar deviation    Wrist radial deviation    Wrist pronation    Wrist supination    Grip strength (lbs)    (Blank rows = not tested)                                                                                                                            TREATMENT DATE:  08/23/2023 Therapeutic exercise/activity: Pulleys flexion x2 min Pulleys ER x2 min Pulleys scaption x2 min Hands behind head thoracic ext in chair x10 Standing IR with strap AAROM 2x10 Supine PROM shoulder all directions Supine contract/relax shoulder ER 10x5" Supine shoulder  flexion x10 Sidelying scaption x10 Sidelying horizontal abd 2x10 Sidelying bow and arrow red TB 2x10 Standing finger ladder flexion 5x10" Standing finger ladder scaption 5x10"  Manual therapy: Grade II to III GH mobs in every direction STM & TPR L pec and along insertion of pecs and subscap on humerus  08/19/2023 Therapeutic exercise/therapeutic activity: Supine shoulder PROM each direction Supine shoulder flexion AAROM 1# WaTE bar x10 Supine shoulder ER at ~45 deg abd, AAROM with 1# WaTE bar x10 Supine shoulder ER at ~80 deg abd AAROM with 1# WaTE bar x10 Supine hands behind head stretch x 1 min, with pulsating elbows apart x30 sec Prone "W" x10 Prone row 2x10 Standing finger ladder flexion 5x10" Standing finger ladder scaption 5x10"  Manual therapy: Grade II to III GH mobs in every direction  08/11/2023 Supine IR/ER AROM with PT assist 20 x 10 seconds in each direction (70 degrees abduction, elbow slightly elevated vs shoulder height)  Functional Activities: Supine arm raises/scapular protraction 20 x 3 seconds (palms facing in) Pulley flexion and scaption with decompression 10 x 10 seconds each Updated and reviewed HEP   08/05/2023 Codman's/pendulum: forward and back; side-to-side; clockwise and counterclockwise 20 times each (cues to keep her torso is close to parallel to the floor as possible and relax the shoulder  to maximize left shoulder range of motion) Pulley protraction with flexion active assisted range of motion with physical therapist assistance for protraction and grip on the pulley secondary to the nerve block still affecting left upper extremity function 10 x 5 seconds  Reviewed exam findings and day 1 home exercise program  PATIENT EDUCATION: Education details: See above Person educated: Patient and Spouse Education method: Explanation, Demonstration, Tactile cues, Verbal cues, and Handouts Education comprehension: verbalized understanding, returned demonstration, verbal cues required, tactile cues required, and needs further education  HOME EXERCISE PROGRAM: Access Code: [email or web address] URL: [email or web address] Date: 08/11/2023 Prepared by: [name]  Exercises - Seated Shoulder Flexion AAROM with Pulley Behind  - 1 x daily - 7 x weekly - 1 sets - 10 reps - 10 seconds hold - Supine Scapular Protraction in Flexion with Dumbbells  - 2-3 x daily - 7 x weekly - 1 sets - 20 reps - 3 seconds hold - Supine Shoulder External Rotation Stretch  - 2-3 x daily - 7 x weekly - 1 sets - 10-20 reps - 10 seconds hold - Supine Shoulder Internal Rotation Stretch  - 2-3 x daily - 7 x weekly - 1 sets - 10-20 reps - 10 seconds hold  ASSESSMENT:  CLINICAL IMPRESSION: Continuing to work on shoulder ROM. Gentle GH joint mobilizations performed this session. Remains most limited with rotation.   Patient is a 48 y.o. female who was seen today for physical therapy evaluation and treatment for s/p left shoulder manipulation under anesthesia with arthroscopy, extensive debridement, rotator interval release and mini-open biceps tenodesis.  [name] had her procedure last night and she is still feeling the effects of the nerve block with her left upper extremity.  Early emphasis will be on range of motion to make sure she does not freeze back up regards to her range of motion.  Appropriate scapular and  rotator cuff strengthening will be implemented as appropriate to facilitate meeting the below listed long-term goals.  OBJECTIVE IMPAIRMENTS: decreased activity tolerance, decreased endurance, decreased knowledge of condition, decreased ROM, decreased strength, increased edema, impaired perceived functional ability, impaired sensation, impaired UE functional use, and pain.   ACTIVITY LIMITATIONS: carrying, lifting, sleeping, dressing,  and reach over head  PARTICIPATION LIMITATIONS: meal prep, cleaning, laundry, driving, shopping, community activity, and occupation  PERSONAL FACTORS: Type 2 DM is also affecting patient's functional outcome.   REHAB POTENTIAL: Good  CLINICAL DECISION MAKING: Stable/uncomplicated  EVALUATION COMPLEXITY: Low   GOALS: Goals reviewed with patient? Yes  SHORT TERM GOALS: Target date: 09/16/2023  [name] will be independent with her day 1 home exercise program Baseline: Started 08/05/2023 Goal status: On Going 08/11/2023  2.  Improve left shoulder active range of motion for flexion to 150 degrees; IR to 70 degrees; ER to 70 degrees and horizontal adduction to 40 degrees Baseline: Passive range of motion 150; 70; 60 and 35 respectively Goal status: On Going 08/11/2023   LONG TERM GOALS: Target date: 10/28/2023  Improve patient specific functional score to at least 70% Baseline: 0% Goal status: INITIAL  2.  [name] will report left shoulder pain consistently 0-3/10 on numeric pain rating scale Baseline: Left upper extremity was completely numb due to a nerve block at evaluation Goal status: On Going 08/11/2023  3.  Improve left shoulder active range of motion to 90% of flexion at 170; IR at 60; ER to 70 and horizontal adduction to 40 degrees Baseline: See objective Goal status: INITIAL  4.  Improve left shoulder strength to at least 60% of the uninvolved right Baseline: Strength testing deferred at evaluation secondary to her procedure being less than 24 hours  ago Goal status: INITIAL  5.  Bryce will be independent with her long-term home maintenance exercise program at discharge Baseline: Started 08/05/2023 Goal status: INITIAL  PLAN:  PT FREQUENCY: 2-3 times a week  PT DURATION: 12 weeks  PLANNED INTERVENTIONS: 97110-Therapeutic exercises, 97530- Therapeutic activity, 97112- Neuromuscular re-education, 97535- Self Care, [id number]- Manual therapy, 97016- Vasopneumatic device, Patient/Family education, Joint mobilization, Cryotherapy, and Moist heat  PLAN FOR NEXT SESSION: Active assistive and active range of motion activities.  Early emphasis on maintaining and improving active range of motion as her biggest issue presurgery was a severe adhesive capsulitis.  Scapular and rotator cuff strengthening as AROM and capsular flexibility improve.   [name] April Ma L Kaitrin Seybold, PT, DPT 08/23/2023, 2:30 PM

## 2023-08-24 ENCOUNTER — Encounter: Payer: Self-pay | Admitting: Family Medicine

## 2023-08-24 ENCOUNTER — Ambulatory Visit: Payer: 59 | Admitting: Family Medicine

## 2023-08-24 VITALS — BP 114/76 | HR 102 | Wt 145.2 lb

## 2023-08-24 DIAGNOSIS — T7840XA Allergy, unspecified, initial encounter: Secondary | ICD-10-CM | POA: Diagnosis not present

## 2023-08-24 DIAGNOSIS — L509 Urticaria, unspecified: Secondary | ICD-10-CM | POA: Diagnosis not present

## 2023-08-24 NOTE — Progress Notes (Unsigned)
 Established Patient Office Visit  Subjective    Patient ID: [name], female    DOB: [birth year]  Age: 48 y.o. MRN: [id number]  CC:  Chief Complaint  Patient presents with   Medical Management of Chronic Issues   Referral    To allergist unsure what she allergic to but she has hives and facial swelling after eat     HPI [name] presents because of allergic reaction. Patient has not had reaction previously but after recent surgery she ate her usual cultural food and developed hives. She took benadryl  and it resolved.   Outpatient Encounter Medications as of 08/24/2023  Medication Sig   empagliflozin  (JARDIANCE ) 10 MG TABS tablet Take 1 tablet (10 mg total) by mouth daily before breakfast.   metFORMIN  (GLUCOPHAGE -XR) 500 MG 24 hr tablet Take 2 tablets (1,000 mg total) by mouth 2 (two) times daily with a meal.   atorvastatin  (LIPITOR) 40 MG tablet Take 1 tablet (40 mg total) by mouth daily.   blood glucose meter kit and supplies KIT Dispense based on patient and insurance preference. Use up to four times daily as directed.   celecoxib  (CELEBREX ) 100 MG capsule Take 1 capsule (100 mg total) by mouth 2 (two) times daily.   Continuous Glucose Sensor (FREESTYLE LIBRE 3 PLUS SENSOR) MISC CHANGE SENSOR EVERY 15 DAYS.   enalapril  (VASOTEC ) 2.5 MG tablet TAKE 1 TABLET(2.5 MG) BY MOUTH DAILY   insulin  aspart (NOVOLOG  FLEXPEN) 100 UNIT/ML FlexPen Inject 8 Units into the skin daily before supper. (Patient not taking: Reported on 08/01/2023)   Insulin  Glargine (BASAGLAR  KWIKPEN) 100 UNIT/ML Inject 22 Units into the skin daily.   insulin  lispro (HUMALOG  KWIKPEN) 200 UNIT/ML KwikPen Inject 8 Units into the skin at bedtime.   Insulin  Pen Needle (BD PEN NEEDLE NANO 2ND GEN) 32G X 4 MM MISC as directed   liraglutide  (VICTOZA ) 18 MG/3ML SOPN Inject 1.8 mg into the skin daily. (Patient taking differently: Inject 1.2 mg into the skin daily.)   methocarbamol  (ROBAXIN ) 500 MG tablet Take 1 tablet (500 mg total)  by mouth every 8 (eight) hours as needed for muscle spasms.   oxyCODONE  (ROXICODONE ) 5 MG immediate release tablet Take 1 tablet (5 mg total) by mouth every 4 (four) hours as needed for severe pain (pain score 7-10).   No facility-administered encounter medications on file as of 08/24/2023.    Past Medical History:  Diagnosis Date   DM type 2 (diabetes mellitus, type 2) (HCC) 07/13/2011   Gestational diabetes    With first pregnancy, glyburide    HTN complicating peripregnancy, antepartum, third trimester 10/11/2016   Obesity (BMI 30-39.9)    Pregnancy induced hypertension    Uterine scar from previous cesarean delivery affecting pregnancy 10/11/2016    Past Surgical History:  Procedure Laterality Date   BICEPT TENODESIS Left 08/04/2023   Procedure: LEFT OPEN BICEPS TENODESIS;  Surgeon: Jasmine Mesi, MD;  Location: [facility] OR;  Service: Orthopedics;  Laterality: Left;  OPEN BICEPS TENODESIS   CESAREAN SECTION N/A 04/10/2013   Procedure: CESAREAN SECTION;  Surgeon: Ana Balling, MD;  Location: [facility];  Service: Obstetrics;  Laterality: N/A;   CESAREAN SECTION N/A 10/11/2016   Procedure: CESAREAN SECTION;  Surgeon: Margaretmary Shaver, MD;  Location: [facility];  Service: Obstetrics;  Laterality: N/A;  [name], RNFA   PERINEUM REPAIR N/A 04/10/2013   Procedure: EPISIOTOMY REPAIR;  Surgeon: Ana Balling, MD;  Location: [facility];  Service: Obstetrics;  Laterality: N/A;  POSTERIOR LUMBAR FUSION 2 WITH HARDWARE REMOVAL Left 08/04/2023   Procedure: LEFT SHOULDER ARTHROSCOPY WITH EXTENSIVE DEBRIDEMENT;  Surgeon: Jasmine Mesi, MD;  Location: [facility] OR;  Service: Orthopedics;  Laterality: Left;  LEFT SHOULDER MANIPULATION UNDER ANESTHESIA, EXTENSIVE DEBRIDEMENT   SHOULDER CLOSED REDUCTION Left 08/04/2023   Procedure: LEFT SHOULDER MANIPULATION WITH ANESTHESIA;  Surgeon: Jasmine Mesi, MD;  Location: [facility] OR;  Service: Orthopedics;  Laterality: Left;    Family History  Problem Relation  Age of Onset   Cancer Father    Alcohol abuse Neg Hx    Arthritis Neg Hx    Asthma Neg Hx    Birth defects Neg Hx    COPD Neg Hx    Depression Neg Hx    Diabetes Neg Hx    Drug abuse Neg Hx    Early death Neg Hx    Hearing loss Neg Hx    Heart disease Neg Hx    Hyperlipidemia Neg Hx    Hypertension Neg Hx    Kidney disease Neg Hx    Learning disabilities Neg Hx    Mental illness Neg Hx    Mental retardation Neg Hx    Miscarriages / Stillbirths Neg Hx    Stroke Neg Hx    Vision loss Neg Hx    Varicose Veins Neg Hx    Breast cancer Neg Hx     Social History   Socioeconomic History   Marital status: Married    Spouse name: Not on file   Number of children: Not on file   Years of education: Not on file   Highest education level: Not on file  Occupational History   Not on file  Tobacco Use   Smoking status: Never   Smokeless tobacco: Never  Vaping Use   Vaping status: Never Used  Substance and Sexual Activity   Alcohol use: No   Drug use: No   Sexual activity: Yes    Birth control/protection: None  Other Topics Concern   Not on file  Social History Narrative   Not on file   Social Drivers of Health   Financial Resource Strain: Low Risk  (02/23/2023)   Overall Financial Resource Strain (CARDIA)    Difficulty of Paying Living Expenses: Not hard at all  Food Insecurity: No Food Insecurity (02/23/2023)   Hunger Vital Sign    Worried About Running Out of Food in the Last Year: Never true    Ran Out of Food in the Last Year: Never true  Transportation Needs: Not on file  Physical Activity: Sufficiently Active (02/23/2023)   Exercise Vital Sign    Days of Exercise per Week: 5 days    Minutes of Exercise per Session: 30 min  Stress: No Stress Concern Present (02/23/2023)   [employer] of Occupational Health - Occupational Stress Questionnaire    Feeling of Stress : Not at all  Social Connections: Moderately Isolated (02/23/2023)   Social Connection and  Isolation Panel [NHANES]    Frequency of Communication with Friends and Family: More than three times a week    Frequency of Social Gatherings with Friends and Family: More than three times a week    Attends Religious Services: Never    Database administrator or Organizations: No    Attends Banker Meetings: Never    Marital Status: Married  Catering manager Violence: Not At Risk (02/23/2023)   Humiliation, Afraid, Rape, and Kick questionnaire    Fear of Current or Ex-Partner:  No    Emotionally Abused: No    Physically Abused: No    Sexually Abused: No    Review of Systems  All other systems reviewed and are negative.       Objective    BP 114/76 (BP Location: Right Arm, Patient Position: Sitting)   Pulse (!) 102   Wt 145 lb 3.2 oz (65.9 kg)   LMP 07/06/2023 (Exact Date)   SpO2 98%   BMI 27.44 kg/m   Physical Exam Vitals and nursing note reviewed.  Constitutional:      General: She is not in acute distress. Cardiovascular:     Rate and Rhythm: Normal rate and regular rhythm.  Pulmonary:     Effort: Pulmonary effort is normal.     Breath sounds: Normal breath sounds.  Skin:    General: Skin is warm.     Findings: Rash is not urticarial.  Neurological:     General: No focal deficit present.     Mental Status: She is alert and oriented to person, place, and time.         Assessment & Plan:   Urticaria  Allergic reaction, initial encounter  Appears to have resolved. Patient defers further eval/mgt at this time   No follow-ups on file.   Arlo Lama, MD

## 2023-08-25 ENCOUNTER — Ambulatory Visit: Admitting: Physical Therapy

## 2023-08-25 ENCOUNTER — Encounter: Payer: Self-pay | Admitting: Family Medicine

## 2023-08-25 DIAGNOSIS — G8929 Other chronic pain: Secondary | ICD-10-CM

## 2023-08-25 DIAGNOSIS — M25512 Pain in left shoulder: Secondary | ICD-10-CM | POA: Diagnosis not present

## 2023-08-25 DIAGNOSIS — R600 Localized edema: Secondary | ICD-10-CM | POA: Diagnosis not present

## 2023-08-25 DIAGNOSIS — M6281 Muscle weakness (generalized): Secondary | ICD-10-CM | POA: Diagnosis not present

## 2023-08-25 DIAGNOSIS — R293 Abnormal posture: Secondary | ICD-10-CM

## 2023-08-25 DIAGNOSIS — M25612 Stiffness of left shoulder, not elsewhere classified: Secondary | ICD-10-CM

## 2023-08-25 NOTE — Therapy (Signed)
 OUTPATIENT PHYSICAL THERAPY SHOULDER TREATMENT   Patient Name: [name] MRN: [id number] DOB:February 05, 1976, 48 y.o., female Today's Date: 08/25/2023  END OF SESSION:  PT End of Session - 08/25/23 1440     Visit Number 5    Number of Visits 24    Date for PT Re-Evaluation 10/28/23    Authorization Type AETNA    PT Start Time 1437    PT Stop Time 1515    PT Time Calculation (min) 38 min    Activity Tolerance Patient tolerated treatment well;No increased pain;Patient limited by pain    Behavior During Therapy [facility] for tasks assessed/performed              Past Medical History:  Diagnosis Date   DM type 2 (diabetes mellitus, type 2) (HCC) 07/13/2011   Gestational diabetes    With first pregnancy, glyburide    HTN complicating peripregnancy, antepartum, third trimester 10/11/2016   Obesity (BMI 30-39.9)    Pregnancy induced hypertension    Uterine scar from previous cesarean delivery affecting pregnancy 10/11/2016   Past Surgical History:  Procedure Laterality Date   BICEPT TENODESIS Left 08/04/2023   Procedure: LEFT OPEN BICEPS TENODESIS;  Surgeon: Jasmine Mesi, MD;  Location: [facility] OR;  Service: Orthopedics;  Laterality: Left;  OPEN BICEPS TENODESIS   CESAREAN SECTION N/A 04/10/2013   Procedure: CESAREAN SECTION;  Surgeon: Ana Balling, MD;  Location: [facility];  Service: Obstetrics;  Laterality: N/A;   CESAREAN SECTION N/A 10/11/2016   Procedure: CESAREAN SECTION;  Surgeon: Margaretmary Shaver, MD;  Location: [facility];  Service: Obstetrics;  Laterality: N/A;  [name], RNFA   PERINEUM REPAIR N/A 04/10/2013   Procedure: EPISIOTOMY REPAIR;  Surgeon: Ana Balling, MD;  Location: [facility];  Service: Obstetrics;  Laterality: N/A;   POSTERIOR LUMBAR FUSION 2 WITH HARDWARE REMOVAL Left 08/04/2023   Procedure: LEFT SHOULDER ARTHROSCOPY WITH EXTENSIVE DEBRIDEMENT;  Surgeon: Jasmine Mesi, MD;  Location: [facility] OR;  Service: Orthopedics;  Laterality: Left;  LEFT SHOULDER MANIPULATION  UNDER ANESTHESIA, EXTENSIVE DEBRIDEMENT   SHOULDER CLOSED REDUCTION Left 08/04/2023   Procedure: LEFT SHOULDER MANIPULATION WITH ANESTHESIA;  Surgeon: Jasmine Mesi, MD;  Location: [facility] OR;  Service: Orthopedics;  Laterality: Left;   Patient Active Problem List   Diagnosis Date Noted   Biceps tendonitis on right 08/06/2023   Degenerative superior labral anterior-to-posterior (SLAP) tear of right shoulder 08/06/2023   Synovitis of right shoulder 08/06/2023   Adhesive capsulitis of right shoulder 08/06/2023   Adhesive capsulitis of left shoulder 03/01/2023   Dyspepsia 12/23/2021   Peeling skin 04/07/2021   Healthcare maintenance 12/22/2018   Hyperlipemia 08/26/2014   Microalbuminuria 08/26/2014   HTN (hypertension) 06/21/2013   Type 2 diabetes mellitus with microalbuminuria, without long-term current use of insulin  (HCC) 07/13/2011    PCP: Abraham Abo, MD  REFERRING PROVIDER: Jasmine Mesi, MD  REFERRING DIAG: M75.02 (ICD-10-CM) - Adhesive capsulitis of left shoulde  THERAPY DIAG:  Abnormal posture  Muscle weakness (generalized)  Localized edema  Chronic left shoulder pain  Stiffness of left shoulder, not elsewhere classified  Rationale for Evaluation and Treatment: Rehabilitation  ONSET DATE: 08/04/2023 manipulation under anesthesia with arthroscopy, extensive debridement, rotator interval release and biceps tenodesis, history of 1.5 years  SUBJECTIVE:  SUBJECTIVE STATEMENT: Pt reports continued tightness.   Hand dominance: Left  PERTINENT HISTORY: Type 2 DM  PAIN:  Are you having pain? Yes: NPRS scale: 3-4/10 this week Pain location: Left shoulder Pain description: Ache, sore Aggravating factors: Use Relieving factors: NA  PRECAUTIONS: Other: Post-surgical/manipulation  shoulder  RED FLAGS: None   WEIGHT BEARING RESTRICTIONS: No  FALLS:  Has patient fallen in last 6 months? No  LIVING ENVIRONMENT: Lives with: lives with their family, lives with their spouse, lives with their daughter, and and mother Lives in: House/apartment Stairs: No problem Has following equipment at home: None  OCCUPATION: Nail tech  PLOF: Independent  PATIENT GOALS:Return to normal function as quickly as possible  NEXT MD VISIT:   OBJECTIVE:  Note: Objective measures were completed at Evaluation unless otherwise noted.  DIAGNOSTIC FINDINGS:  Radiographs of her left shoulder demonstrate no acute fractures no  significant degenerative changes  PATIENT SURVEYS:   Patient specific functional scale (0 unable to 10 no difficulty) 1) Reaching   0/10 2) Wash/style hair  0/10 3) Reach overhead 0/10 Total    0/30 or 0%  COGNITION: Overall cognitive status: Within functional limits for tasks assessed     SENSATION: Still feeling anesthesia  POSTURE: Rounded shoulders and forward head in the sling  UPPER EXTREMITY ROM:   Passive ROM Left/Right 08/04/2023 Left 08/11/2023  Shoulder flexion 150/170 140  Shoulder extension    Shoulder abduction    Shoulder horizontal adduction 35/40 35  Shoulder internal rotation 70/55 50  Shoulder external rotation 60/105 50  Elbow flexion    Elbow extension    Wrist flexion    Wrist extension    Wrist ulnar deviation    Wrist radial deviation    Wrist pronation    Wrist supination    (Blank rows = not tested)  UPPER EXTREMITY STRENGTH: Deferred due to surgery/manipulation yesterday evening  Assessed in pounds with hand-held dynamometer Left/Right 08/05/2023   Shoulder flexion    Shoulder extension    Shoulder abduction    Shoulder adduction    Shoulder internal rotation    Shoulder external rotation    Middle trapezius    Lower trapezius    Elbow flexion    Elbow extension    Wrist flexion    Wrist extension     Wrist ulnar deviation    Wrist radial deviation    Wrist pronation    Wrist supination    Grip strength (lbs)    (Blank rows = not tested)                                                                                                                            TREATMENT DATE:  08/25/2023 Pulleys flexion x 2 min, abd x2 min, ER x 2 min Standing wall slide flexion into lat stretch 2x10 Standing wall slide horizontal abd with arm elevated overhead x10 Standing wall slide ER/IR with shoulder flexed at ~90 deg x10 Standing  wall slide scaption x10 Standing shoulder ER AAROM at wall 2x10 Standing shoulder ER yellow TB 2x10 Supine shoulder abd x10 Supine shoulder flexion x10 Supine shoulder IR/ER at 90 deg abd yellow TB 2x10 Grade II to III GH joint mobilizations all directions Prone "I" 2x10 with GH PA MWM Prone high row 2x10 with GH PA MWM Prone shoulder flexion x10 with GH inferior MWM   08/23/2023 Therapeutic exercise/activity: Pulleys flexion x2 min Pulleys ER x2 min Pulleys scaption x2 min Hands behind head thoracic ext in chair x10 Standing IR with strap AAROM 2x10 Supine PROM shoulder all directions Supine contract/relax shoulder ER 10x5" Supine shoulder flexion x10 Sidelying scaption x10 Sidelying horizontal abd 2x10 Sidelying bow and arrow red TB 2x10 Standing finger ladder flexion 5x10" Standing finger ladder scaption 5x10"  Manual therapy: Grade II to III GH mobs in every direction STM & TPR L pec and along insertion of pecs and subscap on humerus  08/19/2023 Therapeutic exercise/therapeutic activity: Supine shoulder PROM each direction Supine shoulder flexion AAROM 1# WaTE bar x10 Supine shoulder ER at ~45 deg abd, AAROM with 1# WaTE bar x10 Supine shoulder ER at ~80 deg abd AAROM with 1# WaTE bar x10 Supine hands behind head stretch x 1 min, with pulsating elbows apart x30 sec Prone "W" x10 Prone row 2x10 Standing finger ladder flexion 5x10" Standing  finger ladder scaption 5x10"  Manual therapy: Grade II to III GH mobs in every direction   PATIENT EDUCATION: Education details: See above Person educated: Patient and Spouse Education method: Explanation, Demonstration, Tactile cues, Verbal cues, and Handouts Education comprehension: verbalized understanding, returned demonstration, verbal cues required, tactile cues required, and needs further education  HOME EXERCISE PROGRAM: Access Code: [email or web address] URL: [email or web address] Date: 08/25/2023 Prepared by: Lusero Nordlund April Erman Hayward  Exercises - Seated Shoulder Flexion AAROM with Pulley Behind  - 1 x daily - 7 x weekly - 1 sets - 10 reps - 10 seconds hold - Supine Scapular Protraction in Flexion with Dumbbells  - 2-3 x daily - 7 x weekly - 1 sets - 20 reps - 3 seconds hold - Supine Shoulder External Rotation Stretch  - 2-3 x daily - 7 x weekly - 1 sets - 10-20 reps - 10 seconds hold - Supine Shoulder Internal Rotation Stretch  - 2-3 x daily - 7 x weekly - 1 sets - 10-20 reps - 10 seconds hold - Supine Chest Stretch with Elbows Bent  - 1 x daily - 7 x weekly - 1 sets - 10 reps - Standing Shoulder External Rotation Stretch in Doorway  - 1 x daily - 7 x weekly - 1 sets - 10 reps - Shoulder External Rotation and Scapular Retraction with Resistance  - 1 x daily - 7 x weekly - 2 sets - 10 reps - Shoulder Flexion Wall Slide with Towel  - 1 x daily - 7 x weekly - 2 sets - 10 reps - Standing Shoulder Abduction Slides at Wall  - 1 x daily - 7 x weekly - 2 sets - 10 reps - Scaption Wall Slide with Towel  - 1 x daily - 7 x weekly - 2 sets - 10 reps  ASSESSMENT:  CLINICAL IMPRESSION: Continued joint mobilizations and shoulder PROM to try and improve range and capsular flexibility. Limited gains in glenohumeral joint. Cues for pt to stabilize her scapula so she does not steal range from scapular mobility. Worked in prone to improve scapular strength. Progressed pt to wall slides for  increased  self overpressure to encourage more range and work against gravity.   Patient is a 48 y.o. female who was seen today for physical therapy evaluation and treatment for s/p left shoulder manipulation under anesthesia with arthroscopy, extensive debridement, rotator interval release and mini-open biceps tenodesis.  Twylia had her procedure last night and she is still feeling the effects of the nerve block with her left upper extremity.  Early emphasis will be on range of motion to make sure she does not freeze back up regards to her range of motion.  Appropriate scapular and rotator cuff strengthening will be implemented as appropriate to facilitate meeting the below listed long-term goals.  OBJECTIVE IMPAIRMENTS: decreased activity tolerance, decreased endurance, decreased knowledge of condition, decreased ROM, decreased strength, increased edema, impaired perceived functional ability, impaired sensation, impaired UE functional use, and pain.   ACTIVITY LIMITATIONS: carrying, lifting, sleeping, dressing, and reach over head  PARTICIPATION LIMITATIONS: meal prep, cleaning, laundry, driving, shopping, community activity, and occupation  PERSONAL FACTORS: Type 2 DM is also affecting patient's functional outcome.   REHAB POTENTIAL: Good  CLINICAL DECISION MAKING: Stable/uncomplicated  EVALUATION COMPLEXITY: Low   GOALS: Goals reviewed with patient? Yes  SHORT TERM GOALS: Target date: 09/16/2023  Jezabel will be independent with her day 1 home exercise program Baseline: Started 08/05/2023 Goal status: On Going 08/11/2023  2.  Improve left shoulder active range of motion for flexion to 150 degrees; IR to 70 degrees; ER to 70 degrees and horizontal adduction to 40 degrees Baseline: Passive range of motion 150; 70; 60 and 35 respectively Goal status: On Going 08/11/2023   LONG TERM GOALS: Target date: 10/28/2023  Improve patient specific functional score to at least 70% Baseline: 0% Goal  status: INITIAL  2.  Kendrah will report left shoulder pain consistently 0-3/10 on numeric pain rating scale Baseline: Left upper extremity was completely numb due to a nerve block at evaluation Goal status: On Going 08/11/2023  3.  Improve left shoulder active range of motion to 90% of flexion at 170; IR at 60; ER to 70 and horizontal adduction to 40 degrees Baseline: See objective Goal status: INITIAL  4.  Improve left shoulder strength to at least 60% of the uninvolved right Baseline: Strength testing deferred at evaluation secondary to her procedure being less than 24 hours ago Goal status: INITIAL  5.  Lacrisha will be independent with her long-term home maintenance exercise program at discharge Baseline: Started 08/05/2023 Goal status: INITIAL  PLAN:  PT FREQUENCY: 2-3 times a week  PT DURATION: 12 weeks  PLANNED INTERVENTIONS: 97110-Therapeutic exercises, 97530- Therapeutic activity, 97112- Neuromuscular re-education, 97535- Self Care, 29528- Manual therapy, 97016- Vasopneumatic device, Patient/Family education, Joint mobilization, Cryotherapy, and Moist heat  PLAN FOR NEXT SESSION: Active assistive and active range of motion activities.  Early emphasis on maintaining and improving active range of motion as her biggest issue presurgery was a severe adhesive capsulitis.  Scapular and rotator cuff strengthening as AROM and capsular flexibility improve.   Donnika Kucher April Ma L Olean Sangster, PT, DPT 08/25/2023, 2:41 PM

## 2023-08-30 ENCOUNTER — Encounter: Payer: Self-pay | Admitting: Physical Therapy

## 2023-08-30 ENCOUNTER — Ambulatory Visit: Admitting: Physical Therapy

## 2023-08-30 DIAGNOSIS — M25612 Stiffness of left shoulder, not elsewhere classified: Secondary | ICD-10-CM

## 2023-08-30 DIAGNOSIS — R293 Abnormal posture: Secondary | ICD-10-CM

## 2023-08-30 DIAGNOSIS — M6281 Muscle weakness (generalized): Secondary | ICD-10-CM

## 2023-08-30 DIAGNOSIS — M25512 Pain in left shoulder: Secondary | ICD-10-CM

## 2023-08-30 DIAGNOSIS — R600 Localized edema: Secondary | ICD-10-CM | POA: Diagnosis not present

## 2023-08-30 DIAGNOSIS — G8929 Other chronic pain: Secondary | ICD-10-CM | POA: Diagnosis not present

## 2023-08-30 NOTE — Therapy (Signed)
 OUTPATIENT PHYSICAL THERAPY SHOULDER TREATMENT   Patient Name: [name] MRN: [id number] DOB:1975/12/11, 48 y.o., female Today's Date: 08/30/2023  END OF SESSION:  PT End of Session - 08/30/23 1003     Visit Number 6    Number of Visits 24    Date for PT Re-Evaluation 10/28/23    Authorization Type AET[employer]    PT Start Time 1005    PT Stop Time 1045    PT Time Calculation (min) 40 min    Activity Tolerance Patient tolerated treatment well;No increased pain;Patient limited by pain    Behavior During Therapy [facility] for tasks assessed/performed             Past Medical History:  Diagnosis Date   DM type 2 (diabetes mellitus, type 2) (HCC) 07/13/2011   Gestational diabetes    With first pregnancy, glyburide    HTN complicating peripregnancy, antepartum, third trimester 10/11/2016   Obesity (BMI 30-39.9)    Pregnancy induced hypertension    Uterine scar from previous cesarean delivery affecting pregnancy 10/11/2016   Past Surgical History:  Procedure Laterality Date   BICEPT TENODESIS Left 08/04/2023   Procedure: LEFT OPEN BICEPS TENODESIS;  Surgeon: Jasmine Mesi, MD;  Location: [facility] OR;  Service: Orthopedics;  Laterality: Left;  OPEN BICEPS TENODESIS   CESAREAN SECTION N/A 04/10/2013   Procedure: CESAREAN SECTION;  Surgeon: Ana Balling, MD;  Location: [facility] ORS;  Service: Obstetrics;  Laterality: N/A;   CESAREAN SECTION N/A 10/11/2016   Procedure: CESAREAN SECTION;  Surgeon: Margaretmary Shaver, MD;  Location: [facility];  Service: Obstetrics;  Laterality: N/A;  [name], RNFA   PERINEUM REPAIR N/A 04/10/2013   Procedure: EPISIOTOMY REPAIR;  Surgeon: Ana Balling, MD;  Location: [facility] OR[facility];  Service: Obstetrics;  Laterality: N/A;   POSTERIOR LUMBAR FUSION 2 WITH HARDWARE REMOVAL Left 08/04/2023   Procedure: LEFT SHOULDER ARTHROSCOPY WITH EXTENSIVE DEBRIDEMENT;  Surgeon: Jasmine Mesi, MD;  Location: [facility] OR;  Service: Orthopedics;  Laterality: Left;  LEFT SHOULDER MANIPULATION  UNDER ANESTHESIA, EXTENSIVE DEBRIDEMENT   SHOULDER CLOSED REDUCTION Left 08/04/2023   Procedure: LEFT SHOULDER MANIPULATION WITH ANESTHESIA;  Surgeon: Jasmine Mesi, MD;  Location: [facility] OR;  Service: Orthopedics;  Laterality: Left;   Patient Active Problem List   Diagnosis Date Noted   Biceps tendonitis on right 08/06/2023   Degenerative superior labral anterior-to-posterior (SLAP) tear of right shoulder 08/06/2023   Synovitis of right shoulder 08/06/2023   Adhesive capsulitis of right shoulder 08/06/2023   Adhesive capsulitis of left shoulder 03/01/2023   Dyspepsia 12/23/2021   Peeling skin 04/07/2021   Healthcare maintenance 12/22/2018   Hyperlipemia 08/26/2014   Microalbuminuria 08/26/2014   HTN (hypertension) 06/21/2013   Type 2 diabetes mellitus with microalbuminuria, without long-term current use of insulin  (HCC) 07/13/2011    PCP: Abraham Abo, MD  REFERRING PROVIDER: Jasmine Mesi, MD  REFERRING DIAG: M75.02 (ICD-10-CM) - Adhesive capsulitis of left shoulde  THERAPY DIAG:  Abnormal posture  Muscle weakness (generalized)  Localized edema  Chronic left shoulder pain  Stiffness of left shoulder, not elsewhere classified  Rationale for Evaluation and Treatment: Rehabilitation  ONSET DATE: 08/04/2023 manipulation under anesthesia with arthroscopy, extensive debridement, rotator interval release and biceps tenodesis, history of 1.5 years  SUBJECTIVE:  SUBJECTIVE STATEMENT: No new complaints  Hand dominance: Left  PERTINENT HISTORY: Type 2 DM  PAIN:  Are you having pain? Yes: NPRS scale: 3-4/10 this week Pain location: Left shoulder Pain description: Ache, sore Aggravating factors: Use Relieving factors: NA  PRECAUTIONS: Other: Post-surgical/manipulation shoulder  WEIGHT  BEARING RESTRICTIONS: No  FALLS:  Has patient fallen in last 6 months? No  LIVING ENVIRONMENT: Lives with: lives with their family, lives with their spouse, lives with their daughter, and and mother Lives in: House/apartment Stairs: No problem Has following equipment at home: None  OCCUPATION: Nail tech  PLOF: Independent  PATIENT GOALS:Return to normal function as quickly as possible  NEXT MD VISIT:   OBJECTIVE:  Note: Objective measures were completed at Evaluation unless otherwise noted.  DIAGNOSTIC FINDINGS:  Radiographs of her left shoulder demonstrate no acute fractures no  significant degenerative changes  PATIENT SURVEYS:   Patient specific functional scale (0 unable to 10 no difficulty) 1) Reaching   0/10 2) Wash/style hair  0/10 3) Reach overhead 0/10 Total    0/30 or 0%   POSTURE: Rounded shoulders and forward head in the sling  UPPER EXTREMITY ROM:   Passive ROM Left/Right 08/04/2023 Left 08/11/2023 AROM Left 08/30/23   Shoulder flexion 150/170 140 A: 145 P: 150  Shoulder extension     Shoulder abduction     Shoulder horizontal adduction 35/40 35   Shoulder internal rotation 70/55 50 A at 90 deg abd: 50 P: 70  Shoulder external rotation 60/105 50 A at 90 deg abd:55 P: 60  Elbow flexion     Elbow extension     Wrist flexion     Wrist extension     Wrist ulnar deviation     Wrist radial deviation     Wrist pronation     Wrist supination     (Blank rows = not tested)  UPPER EXTREMITY STRENGTH: Deferred due to surgery/manipulation yesterday evening  Assessed in pounds with hand-held dynamometer Left/Right 08/05/2023   Shoulder flexion    Shoulder extension    Shoulder abduction    Shoulder adduction    Shoulder internal rotation    Shoulder external rotation    Middle trapezius    Lower trapezius    Elbow flexion    Elbow extension    Wrist flexion    Wrist extension    Wrist ulnar deviation    Wrist radial deviation    Wrist pronation     Wrist supination    Grip strength (lbs)    (Blank rows = not tested)                                                                                                                            TREATMENT DATE:  08/30/23 Supine pball forward roll for shoulder flexion and then scaption stretch 10x10" Shoulder IR with strap 10x10" Shoulder ER with strap 10x10" Cross body posterior capsule stretch 2x 30" Grade III GH joint  mobs in all directions Shoulder PROM flexion, scaption, IR/ER with MWM Supine shoulder flexion 2# 2x10 Sidelying shoulder ER 2# x15, no weight x10 Sidelying shoulder scaption 2# 2x10 Sidelying 2# shoulder circles in scaption 2x10 Sidelying shoulder horizontal abd 2# 2x10  08/25/2023 Pulleys flexion x 2 min, abd x2 min, ER x 2 min Standing wall slide flexion into lat stretch 2x10 Standing wall slide horizontal abd with arm elevated overhead x10 Standing wall slide ER/IR with shoulder flexed at ~90 deg x10 Standing wall slide scaption x10 Standing shoulder ER AAROM at wall 2x10 Standing shoulder ER yellow TB 2x10 Supine shoulder abd x10 Supine shoulder flexion x10 Supine shoulder IR/ER at 90 deg abd yellow TB 2x10 Grade II to III GH joint mobilizations all directions Prone "I" 2x10 with GH PA MWM Prone high row 2x10 with GH PA MWM Prone shoulder flexion x10 with GH inferior MWM   08/23/2023 Therapeutic exercise/activity: Pulleys flexion x2 min Pulleys ER x2 min Pulleys scaption x2 min Hands behind head thoracic ext in chair x10 Standing IR with strap AAROM 2x10 Supine PROM shoulder all directions Supine contract/relax shoulder ER 10x5" Supine shoulder flexion x10 Sidelying scaption x10 Sidelying horizontal abd 2x10 Sidelying bow and arrow red TB 2x10 Standing finger ladder flexion 5x10" Standing finger ladder scaption 5x10"  Manual therapy: Grade II to III GH mobs in every direction STM & TPR L pec and along insertion of pecs and subscap on  humerus  08/19/2023 Therapeutic exercise/therapeutic activity: Supine shoulder PROM each direction Supine shoulder flexion AAROM 1# WaTE bar x10 Supine shoulder ER at ~45 deg abd, AAROM with 1# WaTE bar x10 Supine shoulder ER at ~80 deg abd AAROM with 1# WaTE bar x10 Supine hands behind head stretch x 1 min, with pulsating elbows apart x30 sec Prone "W" x10 Prone row 2x10 Standing finger ladder flexion 5x10" Standing finger ladder scaption 5x10"  Manual therapy: Grade II to III GH mobs in every direction   PATIENT EDUCATION: Education details: See above Person educated: Patient and Spouse Education method: Explanation, Demonstration, Tactile cues, Verbal cues, and Handouts Education comprehension: verbalized understanding, returned demonstration, verbal cues required, tactile cues required, and needs further education  HOME EXERCISE PROGRAM: Access Code: [email or web address] URL: [email or web address] Date: 08/25/2023 Prepared by: Jaquanna Ballentine April Erman [place]  Exercises - Seated Shoulder Flexion AAROM with Pulley Behind  - 1 x daily - 7 x weekly - 1 sets - 10 reps - 10 seconds hold - Supine Scapular Protraction in Flexion with Dumbbells  - 2-3 x daily - 7 x weekly - 1 sets - 20 reps - 3 seconds hold - Supine Shoulder External Rotation Stretch  - 2-3 x daily - 7 x weekly - 1 sets - 10-20 reps - 10 seconds hold - Supine Shoulder Internal Rotation Stretch  - 2-3 x daily - 7 x weekly - 1 sets - 10-20 reps - 10 seconds hold - Supine Chest Stretch with Elbows Bent  - 1 x daily - 7 x weekly - 1 sets - 10 reps - Standing Shoulder External Rotation Stretch in Doorway  - 1 x daily - 7 x weekly - 1 sets - 10 reps - Shoulder External Rotation and Scapular Retraction with Resistance  - 1 x daily - 7 x weekly - 2 sets - 10 reps - Shoulder Flexion Wall Slide with Towel  - 1 x daily - 7 x weekly - 2 sets - 10 reps - Standing Shoulder Abduction Slides at Wall  - 1 x daily -  7 x weekly - 2 sets -  10 reps - Scaption Wall Slide with Towel  - 1 x daily - 7 x weekly - 2 sets - 10 reps  ASSESSMENT:  CLINICAL IMPRESSION: Remains very limited in her glenohumeral capsule. Continued to provide joint mobilizations and PROM/AAROM/AROM. Continued to progress strengthening. AROM is slowly starting to catch up with shoulder PROM; however, unable to see any great gains with PROM.   Patient is a 48 y.o. female who was seen today for physical therapy evaluation and treatment for s/p left shoulder manipulation under anesthesia with arthroscopy, extensive debridement, rotator interval release and mini-open biceps tenodesis.  Shyloh had her procedure last night and she is still feeling the effects of the nerve block with her left upper extremity.  Early emphasis will be on range of motion to make sure she does not freeze back up regards to her range of motion.  Appropriate scapular and rotator cuff strengthening will be implemented as appropriate to facilitate meeting the below listed long-term goals.  OBJECTIVE IMPAIRMENTS: decreased activity tolerance, decreased endurance, decreased knowledge of condition, decreased ROM, decreased strength, increased edema, impaired perceived functional ability, impaired sensation, impaired UE functional use, and pain.   ACTIVITY LIMITATIONS: carrying, lifting, sleeping, dressing, and reach over head  PARTICIPATION LIMITATIONS: meal prep, cleaning, laundry, driving, shopping, community activity, and occupation  PERSONAL FACTORS: Type 2 DM is also affecting patient's functional outcome.   REHAB POTENTIAL: Good  CLINICAL DECISION MAKING: Stable/uncomplicated  EVALUATION COMPLEXITY: Low   GOALS: Goals reviewed with patient? Yes  SHORT TERM GOALS: Target date: 09/16/2023  Shelva will be independent with her day 1 home exercise program Baseline: Started 08/05/2023 Goal status: On Going 08/11/2023  2.  Improve left shoulder active range of motion for flexion to 150 degrees;  IR to 70 degrees; ER to 70 degrees and horizontal adduction to 40 degrees Baseline: Passive range of motion 150; 70; 60 and 35 respectively Goal status: On Going 08/11/2023   LONG TERM GOALS: Target date: 10/28/2023  Improve patient specific functional score to at least 70% Baseline: 0% Goal status: INITIAL  2.  Shabria will report left shoulder pain consistently 0-3/10 on numeric pain rating scale Baseline: Left upper extremity was completely numb due to a nerve block at evaluation Goal status: On Going 08/11/2023  3.  Improve left shoulder active range of motion to 90% of flexion at 170; IR at 60; ER to 70 and horizontal adduction to 40 degrees Baseline: See objective Goal status: INITIAL  4.  Improve left shoulder strength to at least 60% of the uninvolved right Baseline: Strength testing deferred at evaluation secondary to her procedure being less than 24 hours ago Goal status: INITIAL  5.  Etna will be independent with her long-term home maintenance exercise program at discharge Baseline: Started 08/05/2023 Goal status: INITIAL  PLAN:  PT FREQUENCY: 2-3 times a week  PT DURATION: 12 weeks  PLANNED INTERVENTIONS: 97110-Therapeutic exercises, 97530- Therapeutic activity, 97112- Neuromuscular re-education, 97535- Self Care, 16109- Manual therapy, 97016- Vasopneumatic device, Patient/Family education, Joint mobilization, Cryotherapy, and Moist heat  PLAN FOR NEXT SESSION: Active assistive and active range of motion activities.  Early emphasis on maintaining and improving active range of motion as her biggest issue presurgery was a severe adhesive capsulitis.  Scapular and rotator cuff strengthening as AROM and capsular flexibility improve.   Briana Farner April Ma L Kamarius Buckbee, PT, DPT 08/30/2023, 10:36 AM

## 2023-09-01 ENCOUNTER — Ambulatory Visit: Admitting: Rehabilitative and Restorative Service Providers"

## 2023-09-01 ENCOUNTER — Other Ambulatory Visit: Payer: Self-pay | Admitting: Surgical

## 2023-09-01 ENCOUNTER — Encounter: Payer: Self-pay | Admitting: Rehabilitative and Restorative Service Providers"

## 2023-09-01 DIAGNOSIS — G8929 Other chronic pain: Secondary | ICD-10-CM | POA: Diagnosis not present

## 2023-09-01 DIAGNOSIS — M25612 Stiffness of left shoulder, not elsewhere classified: Secondary | ICD-10-CM

## 2023-09-01 DIAGNOSIS — R293 Abnormal posture: Secondary | ICD-10-CM | POA: Diagnosis not present

## 2023-09-01 DIAGNOSIS — R600 Localized edema: Secondary | ICD-10-CM

## 2023-09-01 DIAGNOSIS — M25512 Pain in left shoulder: Secondary | ICD-10-CM | POA: Diagnosis not present

## 2023-09-01 DIAGNOSIS — M6281 Muscle weakness (generalized): Secondary | ICD-10-CM

## 2023-09-01 NOTE — Therapy (Signed)
 OUTPATIENT PHYSICAL THERAPY SHOULDER TREATMENT NOTE   Patient Name: [name] MRN: [id number] DOB:03/19/1976, 48 y.o., female Today's Date: 09/01/2023  END OF SESSION:  PT End of Session - 09/01/23 0930     Visit Number 7    Number of Visits 24    Date for PT Re-Evaluation 10/28/23    Authorization Type AETNA    PT Start Time 0929    PT Stop Time 1015    PT Time Calculation (min) 46 min    Activity Tolerance Patient tolerated treatment well;No increased pain;Patient limited by pain    Behavior During Therapy [facility] for tasks assessed/performed              Past Medical History:  Diagnosis Date   DM type 2 (diabetes mellitus, type 2) (HCC) 07/13/2011   Gestational diabetes    With first pregnancy, glyburide    HTN complicating peripregnancy, antepartum, third trimester 10/11/2016   Obesity (BMI 30-39.9)    Pregnancy induced hypertension    Uterine scar from previous cesarean delivery affecting pregnancy 10/11/2016   Past Surgical History:  Procedure Laterality Date   BICEPT TENODESIS Left 08/04/2023   Procedure: LEFT OPEN BICEPS TENODESIS;  Surgeon: Jasmine Mesi, MD;  Location: [facility] OR;  Service: Orthopedics;  Laterality: Left;  OPEN BICEPS TENODESIS   CESAREAN SECTION N/A 04/10/2013   Procedure: CESAREAN SECTION;  Surgeon: Ana Balling, MD;  Location: [facility];  Service: Obstetrics;  Laterality: N/A;   CESAREAN SECTION N/A 10/11/2016   Procedure: CESAREAN SECTION;  Surgeon: Margaretmary Shaver, MD;  Location: [facility];  Service: Obstetrics;  Laterality: N/A;  [name], RNFA   PERINEUM REPAIR N/A 04/10/2013   Procedure: EPISIOTOMY REPAIR;  Surgeon: Ana Balling, MD;  Location: [facility];  Service: Obstetrics;  Laterality: N/A;   POSTERIOR LUMBAR FUSION 2 WITH HARDWARE REMOVAL Left 08/04/2023   Procedure: LEFT SHOULDER ARTHROSCOPY WITH EXTENSIVE DEBRIDEMENT;  Surgeon: Jasmine Mesi, MD;  Location: [facility] OR;  Service: Orthopedics;  Laterality: Left;  LEFT SHOULDER  MANIPULATION UNDER ANESTHESIA, EXTENSIVE DEBRIDEMENT   SHOULDER CLOSED REDUCTION Left 08/04/2023   Procedure: LEFT SHOULDER MANIPULATION WITH ANESTHESIA;  Surgeon: Jasmine Mesi, MD;  Location: [facility] OR;  Service: Orthopedics;  Laterality: Left;   Patient Active Problem List   Diagnosis Date Noted   Biceps tendonitis on right 08/06/2023   Degenerative superior labral anterior-to-posterior (SLAP) tear of right shoulder 08/06/2023   Synovitis of right shoulder 08/06/2023   Adhesive capsulitis of right shoulder 08/06/2023   Adhesive capsulitis of left shoulder 03/01/2023   Dyspepsia 12/23/2021   Peeling skin 04/07/2021   Healthcare maintenance 12/22/2018   Hyperlipemia 08/26/2014   Microalbuminuria 08/26/2014   HTN (hypertension) 06/21/2013   Type 2 diabetes mellitus with microalbuminuria, without long-term current use of insulin  (HCC) 07/13/2011    PCP: Abraham Abo, MD  REFERRING PROVIDER: Jasmine Mesi, MD  REFERRING DIAG: M75.02 (ICD-10-CM) - Adhesive capsulitis of left shoulde  THERAPY DIAG:  Abnormal posture  Muscle weakness (generalized)  Localized edema  Chronic left shoulder pain  Stiffness of left shoulder, not elsewhere classified  Rationale for Evaluation and Treatment: Rehabilitation  ONSET DATE: 08/04/2023 manipulation under anesthesia with arthroscopy, extensive debridement, rotator interval release and biceps tenodesis, history of 1.5 years  SUBJECTIVE:  SUBJECTIVE STATEMENT: [name] reports good HEP compliance.  She still feels tight, but she feels as if she is improved as compared to evaluation.  Hand dominance: Left  PERTINENT HISTORY: Type 2 DM  PAIN:  Are you having pain? Yes: NPRS scale: 2-3/10 this week Pain location: Left shoulder Pain description: Ache,  sore Aggravating factors: Use Relieving factors: NA  PRECAUTIONS: Other: Post-surgical/manipulation shoulder  WEIGHT BEARING RESTRICTIONS: No  FALLS:  Has patient fallen in last 6 months? No  LIVING ENVIRONMENT: Lives with: lives with their family, lives with their spouse, lives with their daughter, and and mother Lives in: House/apartment Stairs: No problem Has following equipment at home: None  OCCUPATION: Nail tech  PLOF: Independent  PATIENT GOALS:Return to normal function as quickly as possible  NEXT MD VISIT:   OBJECTIVE:  Note: Objective measures were completed at Evaluation unless otherwise noted.  DIAGNOSTIC FINDINGS:  Radiographs of her left shoulder demonstrate no acute fractures no  significant degenerative changes  PATIENT SURVEYS:  09/01/2023: Patient specific functional scale (0 unable to 10 no difficulty) 1) Reaching   4/10 2) Wash/style hair  3/10 3) Reach overhead 2/10 Total    3/30 or 10%   Evaluation: Patient specific functional scale (0 unable to 10 no difficulty) 1) Reaching   0/10 2) Wash/style hair  0/10 3) Reach overhead 0/10 Total    0/30 or 0%   POSTURE: Rounded shoulders and forward head in the sling  UPPER EXTREMITY ROM:   Passive ROM Left/Right 08/04/2023 Left 08/11/2023 AROM Left 08/30/23  Left/Right 09/01/2023  Shoulder flexion 150/170 140 A: 145 P: 150 140  Shoulder extension      Shoulder abduction      Shoulder horizontal adduction 35/40 35  40  Shoulder internal rotation 70/55 50 A at 90 deg abd: 50 P: 70 50 at 70 degrees abduction  Shoulder external rotation 60/105 50 A at 90 deg abd:55 P: 60 45 at 70 degrees abduction  Elbow flexion      Elbow extension      Wrist flexion      Wrist extension      Wrist ulnar deviation      Wrist radial deviation      Wrist pronation      Wrist supination      (Blank rows = not tested)  UPPER EXTREMITY STRENGTH: Deferred due to surgery/manipulation yesterday evening  Assessed  in pounds with hand-held dynamometer Left/Right 08/05/2023 Left/Right 09/01/2023  Shoulder flexion    Shoulder extension    Shoulder abduction    Shoulder adduction    Shoulder internal rotation  15.8/24.7  Shoulder external rotation  11.2/19.8  Middle trapezius    Lower trapezius    Elbow flexion    Elbow extension    Wrist flexion    Wrist extension    Wrist ulnar deviation    Wrist radial deviation    Wrist pronation    Wrist supination    Grip strength (lbs)    (Blank rows = not tested)  TREATMENT DATE:  09/01/2023 -Supine shoulder IR stretch (70 degrees abduction), elbow on pillows so it is slightly above shoulder height and reach towards pants pocket 20 x 10 seconds -Supine shoulder ER stretch (70 degrees abduction), elbow on pillows so it is slightly above shoulder height and reach back towards mat like reaching for a seatbelt 20 x 10 seconds -Thera-Band ER and IR Green 10 x each direction with slow eccentrics  Functional Activities: -Supine shoulder flexion, reach up (protract shoulder), palm facing in 20 x 10 seconds for reaching and overhead function -Thumb up the back stretch for internal rotation/behind the back (belt, backseat) function 10 x 10 seconds   08/30/23 Supine pball forward roll for shoulder flexion and then scaption stretch 10x10" Shoulder IR with strap 10x10" Shoulder ER with strap 10x10" Cross body posterior capsule stretch 2x 30" Grade III GH joint mobs in all directions Shoulder PROM flexion, scaption, IR/ER with MWM Supine shoulder flexion 2# 2x10 Sidelying shoulder ER 2# x15, no weight x10 Sidelying shoulder scaption 2# 2x10 Sidelying 2# shoulder circles in scaption 2x10 Sidelying shoulder horizontal abd 2# 2x10   08/25/2023 Pulleys flexion x 2 min, abd x2 min, ER x 2 min Standing wall slide flexion into lat stretch  2x10 Standing wall slide horizontal abd with arm elevated overhead x10 Standing wall slide ER/IR with shoulder flexed at ~90 deg x10 Standing wall slide scaption x10 Standing shoulder ER AAROM at wall 2x10 Standing shoulder ER yellow TB 2x10 Supine shoulder abd x10 Supine shoulder flexion x10 Supine shoulder IR/ER at 90 deg abd yellow TB 2x10 Grade II to III GH joint mobilizations all directions Prone "I" 2x10 with GH PA MWM Prone high row 2x10 with GH PA MWM Prone shoulder flexion x10 with GH inferior MWM   PATIENT EDUCATION: Education details: See above Person educated: Patient and Spouse Education method: Explanation, Demonstration, Tactile cues, Verbal cues, and Handouts Education comprehension: verbalized understanding, returned demonstration, verbal cues required, tactile cues required, and needs further education  HOME EXERCISE PROGRAM: Access Code: [email or web address] URL: [email or web address] Date: 08/25/2023 Prepared by: Gellen April Erman [place]  Exercises - Seated Shoulder Flexion AAROM with Pulley Behind  - 1 x daily - 7 x weekly - 1 sets - 10 reps - 10 seconds hold - Supine Scapular Protraction in Flexion with Dumbbells  - 2-3 x daily - 7 x weekly - 1 sets - 20 reps - 3 seconds hold - Supine Shoulder External Rotation Stretch  - 2-3 x daily - 7 x weekly - 1 sets - 10-20 reps - 10 seconds hold - Supine Shoulder Internal Rotation Stretch  - 2-3 x daily - 7 x weekly - 1 sets - 10-20 reps - 10 seconds hold - Supine Chest Stretch with Elbows Bent  - 1 x daily - 7 x weekly - 1 sets - 10 reps - Standing Shoulder External Rotation Stretch in Doorway  - 1 x daily - 7 x weekly - 1 sets - 10 reps - Shoulder External Rotation and Scapular Retraction with Resistance  - 1 x daily - 7 x weekly - 2 sets - 10 reps - Shoulder Flexion Wall Slide with Towel  - 1 x daily - 7 x weekly - 2 sets - 10 reps - Standing Shoulder Abduction Slides at Wall  - 1 x daily - 7 x weekly - 2 sets -  10 reps - Scaption Wall Slide with Towel  - 1 x daily - 7 x weekly - 2 sets - 10 reps  ASSESSMENT:  CLINICAL IMPRESSION: Margree is still very stiff with her left shoulder and function is still quite limited.  We have been working very hard on capsular stretching and active range of motion and I anticipate she will continue to progress in these areas with the recommended course of PT (12 total weeks expected).  AROM and capsular flexibility remains the emphasis with enough scapular and rotator cuff work to allow [place] to function without pain in her current range of motion.  Patient is a 48 y.o. female who was seen today for physical therapy evaluation and treatment for s/p left shoulder manipulation under anesthesia with arthroscopy, extensive debridement, rotator interval release and mini-open biceps tenodesis.  Darria had her procedure last night and she is still feeling the effects of the nerve block with her left upper extremity.  Early emphasis will be on range of motion to make sure she does not freeze back up regards to her range of motion.  Appropriate scapular and rotator cuff strengthening will be implemented as appropriate to facilitate meeting the below listed long-term goals.  OBJECTIVE IMPAIRMENTS: decreased activity tolerance, decreased endurance, decreased knowledge of condition, decreased ROM, decreased strength, increased edema, impaired perceived functional ability, impaired sensation, impaired UE functional use, and pain.   ACTIVITY LIMITATIONS: carrying, lifting, sleeping, dressing, and reach over head  PARTICIPATION LIMITATIONS: meal prep, cleaning, laundry, driving, shopping, community activity, and occupation  PERSONAL FACTORS: Type 2 DM is also affecting patient's functional outcome.   REHAB POTENTIAL: Good  CLINICAL DECISION MAKING: Stable/uncomplicated  EVALUATION COMPLEXITY: Low   GOALS: Goals reviewed with patient? Yes  SHORT TERM GOALS: Target date:  09/16/2023  Keiona will be independent with her day 1 home exercise program Baseline: Started 08/05/2023 Goal status: On Going 09/01/2023  2.  Improve left shoulder active range of motion for flexion to 150 degrees; IR to 70 degrees; ER to 70 degrees and horizontal adduction to 40 degrees Baseline: Passive range of motion 150; 70; 60 and 35 respectively Goal status: Partially Met 09/01/2023 (horizontal adduction met)   LONG TERM GOALS: Target date: 10/28/2023  Improve patient specific functional score to at least 70% Baseline: 0% Goal status: INITIAL  2.  Jeslynn will report left shoulder pain consistently 0-3/10 on numeric pain rating scale Baseline: Left upper extremity was completely numb due to a nerve block at evaluation Goal status: On Going 08/11/2023  3.  Improve left shoulder active range of motion to 90% of flexion at 170; IR at 60; ER to 70 and horizontal adduction to 40 degrees Baseline: See objective Goal status: INITIAL  4.  Improve left shoulder strength to at least 60% of the uninvolved right Baseline: Strength testing deferred at evaluation secondary to her procedure being less than 24 hours ago Goal status: INITIAL  5.  [place]onia will be independent with her long-term home maintenance exercise program at discharge Baseline: Started 08/05/2023 Goal status: INITIAL  PLAN:  PT FREQUENCY: 2-3 times a week  PT DURATION: 12 weeks total (8 additional weeks)  PLANNED INTERVENTIONS: 97110-Therapeutic exercises, 97530- Therapeutic activity, 97112- Neuromuscular re-education, 97535- Self Care, 16109- Manual therapy, 97016- Vasopneumatic device, Patient/Family education, Joint mobilization, Cryotherapy, and Moist heat  PLAN FOR NEXT SESSION: Capsular stretching, Active assistive and active range of motion activities.  Early emphasis on maintaining and improving active range of motion as her biggest issue pre-surgery was a severe adhesive capsulitis.  Scapular and rotator cuff  strengthening as AROM and capsular flexibility improve.   Joli Neas, PT, MPT  09/01/2023, 5:51 PM

## 2023-09-07 ENCOUNTER — Ambulatory Visit (INDEPENDENT_AMBULATORY_CARE_PROVIDER_SITE_OTHER): Admitting: Orthopedic Surgery

## 2023-09-07 DIAGNOSIS — M7502 Adhesive capsulitis of left shoulder: Secondary | ICD-10-CM

## 2023-09-11 ENCOUNTER — Encounter: Payer: Self-pay | Admitting: Orthopedic Surgery

## 2023-09-11 NOTE — Progress Notes (Signed)
 Post-Op Visit Note   Patient: [name]           Date of Birth: [birth year]           MRN: [id number] Visit Date: 09/07/2023 PCP: Abraham Abo, MD   Assessment & Plan:  Chief Complaint:  Chief Complaint  Patient presents with   Left Shoulder - Routine Post Op     left shoulder manipulation under anesthesia and biceps tenodesis on 08/04/2023   Visit Diagnoses:  1. Adhesive capsulitis of left shoulder     Plan: [name] is a patient who underwent left shoulder arthroscopy with rotator interval release and manipulation on 08/04/2023.  Doing home exercise program.  Taking some anti-inflammatories.  On exam range of motion is 20/80/120.  She has had 5 physical therapy sessions.  I want her to really continue to work on range of motion at least 1 to 2 hours a day.  No strengthening indicated at this time we want to focus fully on stretching.  4-week return with [place] for final recheck.  Follow-Up Instructions: Return in about 4 weeks (around 10/05/2023).   Orders:  No orders of the defined types were placed in this encounter.  No orders of the defined types were placed in this encounter.   Imaging: No results found.  PMFS History: Patient Active Problem List   Diagnosis Date Noted   Biceps tendonitis on right 08/06/2023   Degenerative superior labral anterior-to-posterior (SLAP) tear of right shoulder 08/06/2023   Synovitis of right shoulder 08/06/2023   Adhesive capsulitis of right shoulder 08/06/2023   Adhesive capsulitis of left shoulder 03/01/2023   Dyspepsia 12/23/2021   Peeling skin 04/07/2021   Healthcare maintenance 12/22/2018   Hyperlipemia 08/26/2014   Microalbuminuria 08/26/2014   HTN (hypertension) 06/21/2013   Type 2 diabetes mellitus with microalbuminuria, without long-term current use of insulin  (HCC) 07/13/2011   Past Medical History:  Diagnosis Date   DM type 2 (diabetes mellitus, type 2) (HCC) 07/13/2011   Gestational diabetes    With first pregnancy,  glyburide    HTN complicating peripregnancy, antepartum, third trimester 10/11/2016   Obesity (BMI 30-39.9)    Pregnancy induced hypertension    Uterine scar from previous cesarean delivery affecting pregnancy 10/11/2016    Family History  Problem Relation Age of Onset   Cancer Father    Alcohol abuse Neg Hx    Arthritis Neg Hx    Asthma Neg Hx    Birth defects Neg Hx    COPD Neg Hx    Depression Neg Hx    Diabetes Neg Hx    Drug abuse Neg Hx    Early death Neg Hx    Hearing loss Neg Hx    Heart disease Neg Hx    Hyperlipidemia Neg Hx    Hypertension Neg Hx    Kidney disease Neg Hx    Learning disabilities Neg Hx    Mental illness Neg Hx    Mental retardation Neg Hx    Miscarriages / Stillbirths Neg Hx    Stroke Neg Hx    Vision loss Neg Hx    Varicose Veins Neg Hx    Breast cancer Neg Hx     Past Surgical History:  Procedure Laterality Date   BICEPT TENODESIS Left 08/04/2023   Procedure: LEFT OPEN BICEPS TENODESIS;  Surgeon: Jasmine Mesi, MD;  Location: [facility] OR;  Service: Orthopedics;  Laterality: Left;  OPEN BICEPS TENODESIS   CESAREAN SECTION N/A 04/10/2013   Procedure: CESAREAN SECTION;  Surgeon: Ana Balling, MD;  Location: [facility];  Service: Obstetrics;  Laterality: N/A;   CESAREAN SECTION N/A 10/11/2016   Procedure: CESAREAN SECTION;  Surgeon: Margaretmary Shaver, MD;  Location: [facility];  Service: Obstetrics;  Laterality: N/A;  [name], RNFA   PERINEUM REPAIR N/A 04/10/2013   Procedure: EPISIOTOMY REPAIR;  Surgeon: Ana Balling, MD;  Location: [facility];  Service: Obstetrics;  Laterality: N/A;   POSTERIOR LUMBAR FUSION 2 WITH HARDWARE REMOVAL Left 08/04/2023   Procedure: LEFT SHOULDER ARTHROSCOPY WITH EXTENSIVE DEBRIDEMENT;  Surgeon: Jasmine Mesi, MD;  Location: [facility] OR;  Service: Orthopedics;  Laterality: Left;  LEFT SHOULDER MANIPULATION UNDER ANESTHESIA, EXTENSIVE DEBRIDEMENT   SHOULDER CLOSED REDUCTION Left 08/04/2023   Procedure: LEFT SHOULDER  MANIPULATION WITH ANESTHESIA;  Surgeon: Jasmine Mesi, MD;  Location: [facility] OR;  Service: Orthopedics;  Laterality: Left;   Social History   Occupational History   Not on file  Tobacco Use   Smoking status: Never   Smokeless tobacco: Never  Vaping Use   Vaping status: Never Used  Substance and Sexual Activity   Alcohol use: No   Drug use: No   Sexual activity: Yes    Birth control/protection: None

## 2023-09-16 ENCOUNTER — Encounter: Payer: Self-pay | Admitting: Physical Therapy

## 2023-09-16 ENCOUNTER — Ambulatory Visit: Admitting: Physical Therapy

## 2023-09-16 DIAGNOSIS — M25612 Stiffness of left shoulder, not elsewhere classified: Secondary | ICD-10-CM | POA: Diagnosis not present

## 2023-09-16 DIAGNOSIS — M25512 Pain in left shoulder: Secondary | ICD-10-CM

## 2023-09-16 DIAGNOSIS — R600 Localized edema: Secondary | ICD-10-CM

## 2023-09-16 DIAGNOSIS — R293 Abnormal posture: Secondary | ICD-10-CM | POA: Diagnosis not present

## 2023-09-16 DIAGNOSIS — G8929 Other chronic pain: Secondary | ICD-10-CM

## 2023-09-16 DIAGNOSIS — M6281 Muscle weakness (generalized): Secondary | ICD-10-CM | POA: Diagnosis not present

## 2023-09-16 NOTE — Therapy (Signed)
 OUTPATIENT PHYSICAL THERAPY SHOULDER TREATMENT NOTE   Patient Name: [name] MRN: [id number] DOB:1975/08/09, 48 y.o., female Today's Date: 09/16/2023  END OF SESSION:  PT End of Session - 09/16/23 0913     Visit Number 8    Number of Visits 24    Date for PT Re-Evaluation 10/28/23    Authorization Type AET[employer]    PT Start Time 0847    PT Stop Time 0928    PT Time Calculation (min) 41 min    Activity Tolerance Patient tolerated treatment well;Patient limited by pain    Behavior During Therapy [facility] for tasks assessed/performed               Past Medical History:  Diagnosis Date   DM type 2 (diabetes mellitus, type 2) (HCC) 07/13/2011   Gestational diabetes    With first pregnancy, glyburide    HTN complicating peripregnancy, antepartum, third trimester 10/11/2016   Obesity (BMI 30-39.9)    Pregnancy induced hypertension    Uterine scar from previous cesarean delivery affecting pregnancy 10/11/2016   Past Surgical History:  Procedure Laterality Date   BICEPT TENODESIS Left 08/04/2023   Procedure: LEFT OPEN BICEPS TENODESIS;  Surgeon: Jasmine Mesi, MD;  Location: [facility] OR;  Service: Orthopedics;  Laterality: Left;  OPEN BICEPS TENODESIS   CESAREAN SECTION N/A 04/10/2013   Procedure: CESAREAN SECTION;  Surgeon: Ana Balling, MD;  Location: [facility] ORS;  Service: Obstetrics;  Laterality: N/A;   CESAREAN SECTION N/A 10/11/2016   Procedure: CESAREAN SECTION;  Surgeon: Margaretmary Shaver, MD;  Location: [facility];  Service: Obstetrics;  Laterality: N/A;  [name], RNFA   PERINEUM REPAIR N/A 04/10/2013   Procedure: EPISIOTOMY REPAIR;  Surgeon: Ana Balling, MD;  Location: [facility];  Service: Obstetrics;  Laterality: N/A;   POSTERIOR LUMBAR FUSION 2 WITH HARDWARE REMOVAL Left 08/04/2023   Procedure: LEFT SHOULDER ARTHROSCOPY WITH EXTENSIVE DEBRIDEMENT;  Surgeon: Jasmine Mesi, MD;  Location: [facility] OR;  Service: Orthopedics;  Laterality: Left;  LEFT SHOULDER MANIPULATION UNDER  ANESTHESIA, EXTENSIVE DEBRIDEMENT   SHOULDER CLOSED REDUCTION Left 08/04/2023   Procedure: LEFT SHOULDER MANIPULATION WITH ANESTHESIA;  Surgeon: Jasmine Mesi, MD;  Location: [facility] OR;  Service: Orthopedics;  Laterality: Left;   Patient Active Problem List   Diagnosis Date Noted   Biceps tendonitis on right 08/06/2023   Degenerative superior labral anterior-to-posterior (SLAP) tear of right shoulder 08/06/2023   Synovitis of right shoulder 08/06/2023   Adhesive capsulitis of right shoulder 08/06/2023   Adhesive capsulitis of left shoulder 03/01/2023   Dyspepsia 12/23/2021   Peeling skin 04/07/2021   Healthcare maintenance 12/22/2018   Hyperlipemia 08/26/2014   Microalbuminuria 08/26/2014   HTN (hypertension) 06/21/2013   Type 2 diabetes mellitus with microalbuminuria, without long-term current use of insulin  (HCC) 07/13/2011    PCP: Abraham Abo, MD  REFERRING PROVIDER: Jasmine Mesi, MD  REFERRING DIAG: M75.02 (ICD-10-CM) - Adhesive capsulitis of left shoulde  THERAPY DIAG:  Abnormal posture  Muscle weakness (generalized)  Localized edema  Chronic left shoulder pain  Stiffness of left shoulder, not elsewhere classified  Rationale for Evaluation and Treatment: Rehabilitation  ONSET DATE: 08/04/2023 manipulation under anesthesia with arthroscopy, extensive debridement, rotator interval release and biceps tenodesis, history of 1.5 years  SUBJECTIVE:  SUBJECTIVE STATEMENT:  Shoulder is a little stiff and sore still getting pains down arm     Hand dominance: Left  PERTINENT HISTORY: Type 2 DM  PAIN:  Are you having pain? Yes: NPRS scale: 8-9/10 right now, unsure what best has been this week  Pain location: Left shoulder Pain description: Ache, sore Aggravating factors: maybe  sleeping on it, heavier use Relieving factors: nothing   PRECAUTIONS: Other: Post-surgical/manipulation shoulder  WEIGHT BEARING RESTRICTIONS: No  FALLS:  Has patient fallen in last 6 months? No  LIVING ENVIRONMENT: Lives with: lives with their family, lives with their spouse, lives with their daughter, and and mother Lives in: House/apartment Stairs: No problem Has following equipment at home: None  OCCUPATION: Nail tech  PLOF: Independent  PATIENT GOALS:Return to normal function as quickly as possible  NEXT MD VISIT:   OBJECTIVE:  Note: Objective measures were completed at Evaluation unless otherwise noted.  DIAGNOSTIC FINDINGS:  Radiographs of her left shoulder demonstrate no acute fractures no  significant degenerative changes  PATIENT SURVEYS:  09/01/2023: Patient specific functional scale (0 unable to 10 no difficulty) 1) Reaching   4/10 2) Wash/style hair  3/10 3) Reach overhead 2/10 Total    3/30 or 10%   Evaluation: Patient specific functional scale (0 unable to 10 no difficulty) 1) Reaching   0/10 2) Wash/style hair  0/10 3) Reach overhead 0/10 Total    0/30 or 0%   POSTURE: Rounded shoulders and forward head in the sling  UPPER EXTREMITY ROM:   Passive ROM Left/Right 08/04/2023 Left 08/11/2023 AROM Left 08/30/23  Left/Right 09/01/2023 Left 09/16/23 supine   Shoulder flexion 150/170 140 A: 145 P: 150 140 P 140* A 150*  Shoulder extension       Shoulder abduction     P 100* A 150*  Shoulder horizontal adduction 35/40 35  40   Shoulder internal rotation 70/55 50 A at 90 deg abd: 50 P: 70 50 at 70 degrees abduction P 50* at 70 degrees ABD   Shoulder external rotation 60/105 50 A at 90 deg abd:55 P: 60 45 at 70 degrees abduction P 30* at 70 degrees ABD   Elbow flexion       Elbow extension       Wrist flexion       Wrist extension       Wrist ulnar deviation       Wrist radial deviation       Wrist pronation       Wrist supination       (Blank  rows = not tested)  UPPER EXTREMITY STRENGTH: Deferred due to surgery/manipulation yesterday evening  Assessed in pounds with hand-held dynamometer Left/Right 08/05/2023 Left/Right 09/01/2023  Shoulder flexion    Shoulder extension    Shoulder abduction    Shoulder adduction    Shoulder internal rotation  15.8/24.7  Shoulder external rotation  11.2/19.8  Middle trapezius    Lower trapezius    Elbow flexion    Elbow extension    Wrist flexion    Wrist extension    Wrist ulnar deviation    Wrist radial deviation    Wrist pronation    Wrist supination    Grip strength (lbs)    (Blank rows = not tested)  TREATMENT DATE:   09/16/23  ROM check/goal update, education on status of shoulder, progression with PT, time needed after shoulder surgery to heal and recovery properly for maximal functional return   PROM/stretching with GH mobs to tolerance all directions as appropriate   AAROM flexion 12x3 seconds  Serratus punches 0# 15x2 second holds Sleeper stretch for IR ROM 4x30 seconds Supine shoulder flexion 2# 2x10  Sidelying shoulder ABD 2# 2x10  Wall ladder for flexion 10x3 seconds L      09/01/2023 -Supine shoulder IR stretch (70 degrees abduction), elbow on pillows so it is slightly above shoulder height and reach towards pants pocket 20 x 10 seconds -Supine shoulder ER stretch (70 degrees abduction), elbow on pillows so it is slightly above shoulder height and reach back towards mat like reaching for a seatbelt 20 x 10 seconds -Thera-Band ER and IR Green 10 x each direction with slow eccentrics  Functional Activities: -Supine shoulder flexion, reach up (protract shoulder), palm facing in 20 x 10 seconds for reaching and overhead function -Thumb up the back stretch for internal rotation/behind the back (belt, backseat) function 10 x 10  seconds   08/30/23 Supine pball forward roll for shoulder flexion and then scaption stretch 10x10" Shoulder IR with strap 10x10" Shoulder ER with strap 10x10" Cross body posterior capsule stretch 2x 30" Grade III GH joint mobs in all directions Shoulder PROM flexion, scaption, IR/ER with MWM Supine shoulder flexion 2# 2x10 Sidelying shoulder ER 2# x15, no weight x10 Sidelying shoulder scaption 2# 2x10 Sidelying 2# shoulder circles in scaption 2x10 Sidelying shoulder horizontal abd 2# 2x10   08/25/2023 Pulleys flexion x 2 min, abd x2 min, ER x 2 min Standing wall slide flexion into lat stretch 2x10 Standing wall slide horizontal abd with arm elevated overhead x10 Standing wall slide ER/IR with shoulder flexed at ~90 deg x10 Standing wall slide scaption x10 Standing shoulder ER AAROM at wall 2x10 Standing shoulder ER yellow TB 2x10 Supine shoulder abd x10 Supine shoulder flexion x10 Supine shoulder IR/ER at 90 deg abd yellow TB 2x10 Grade II to III GH joint mobilizations all directions Prone "I" 2x10 with GH PA MWM Prone high row 2x10 with GH PA MWM Prone shoulder flexion x10 with GH inferior MWM   PATIENT EDUCATION: Education details: See above Person educated: Patient and Spouse Education method: Explanation, Demonstration, Tactile cues, Verbal cues, and Handouts Education comprehension: verbalized understanding, returned demonstration, verbal cues required, tactile cues required, and needs further education  HOME EXERCISE PROGRAM: Access Code: [email or web address] URL: [email or web address] Date: 08/25/2023 Prepared by: Gellen April Erman [place]  Exercises - Seated Shoulder Flexion AAROM with Pulley Behind  - 1 x daily - 7 x weekly - 1 sets - 10 reps - 10 seconds hold - Supine Scapular Protraction in Flexion with Dumbbells  - 2-3 x daily - 7 x weekly - 1 sets - 20 reps - 3 seconds hold - Supine Shoulder External Rotation Stretch  - 2-3 x daily - 7 x weekly - 1 sets -  10-20 reps - 10 seconds hold - Supine Shoulder Internal Rotation Stretch  - 2-3 x daily - 7 x weekly - 1 sets - 10-20 reps - 10 seconds hold - Supine Chest Stretch with Elbows Bent  - 1 x daily - 7 x weekly - 1 sets - 10 reps - Standing Shoulder External Rotation Stretch in Doorway  - 1 x daily - 7 x weekly - 1 sets - 10 reps - Shoulder External Rotation and  Scapular Retraction with Resistance  - 1 x daily - 7 x weekly - 2 sets - 10 reps - Shoulder Flexion Wall Slide with Towel  - 1 x daily - 7 x weekly - 2 sets - 10 reps - Standing Shoulder Abduction Slides at Wall  - 1 x daily - 7 x weekly - 2 sets - 10 reps - Scaption Wall Slide with Towel  - 1 x daily - 7 x weekly - 2 sets - 10 reps  ASSESSMENT:  CLINICAL IMPRESSION:  Doing OK but having a lot of shoulder pain still- updated ROM measures otherwise kept working on ROM and periscap strength today as per POC. She was fairly guarded with PROM check today, able to get significantly more motion with AAROM/AROM but still pain limited. Will continue to assess and progress as appropriate.   EVAL: Patient is a 48 y.o. female who was seen today for physical therapy evaluation and treatment for s/p left shoulder manipulation under anesthesia with arthroscopy, extensive debridement, rotator interval release and mini-open biceps tenodesis.  [name] had her procedure last night and she is still feeling the effects of the nerve block with her left upper extremity.  Early emphasis will be on range of motion to make sure she does not freeze back up regards to her range of motion.  Appropriate scapular and rotator cuff strengthening will be implemented as appropriate to facilitate meeting the below listed long-term goals.  OBJECTIVE IMPAIRMENTS: decreased activity tolerance, decreased endurance, decreased knowledge of condition, decreased ROM, decreased strength, increased edema, impaired perceived functional ability, impaired sensation, impaired UE functional use,  and pain.   ACTIVITY LIMITATIONS: carrying, lifting, sleeping, dressing, and reach over head  PARTICIPATION LIMITATIONS: meal prep, cleaning, laundry, driving, shopping, community activity, and occupation  PERSONAL FACTORS: Type 2 DM is also affecting patient's functional outcome.   REHAB POTENTIAL: Good  CLINICAL DECISION MAKING: Stable/uncomplicated  EVALUATION COMPLEXITY: Low   GOALS: Goals reviewed with patient? Yes  SHORT TERM GOALS: Target date: 09/16/2023  [name] will be independent with her day 1 home exercise program Baseline: Started 08/05/2023 Goal status: On Going 09/16/23  2.  Improve left shoulder active range of motion for flexion to 150 degrees; IR to 70 degrees; ER to 70 degrees and horizontal adduction to 40 degrees Baseline: Passive range of motion 150; 70; 60 and 35 respectively Goal status: Partially Met 09/16/23   LONG TERM GOALS: Target date: 10/28/2023  Improve patient specific functional score to at least 70% Baseline: 0% Goal status: INITIAL  2.  [name] will report left shoulder pain consistently 0-3/10 on numeric pain rating scale Baseline: Left upper extremity was completely numb due to a nerve block at evaluation Goal status: On Going 08/11/2023  3.  Improve left shoulder active range of motion to 90% of flexion at 170; IR at 60; ER to 70 and horizontal adduction to 40 degrees Baseline: See objective Goal status: INITIAL  4.  Improve left shoulder strength to at least 60% of the uninvolved right Baseline: Strength testing deferred at evaluation secondary to her procedure being less than 24 hours ago Goal status: INITIAL  5.  [name] will be independent with her long-term home maintenance exercise program at discharge Baseline: Started 08/05/2023 Goal status: INITIAL  PLAN:  PT FREQUENCY: 2-3 times a week  PT DURATION: 12 weeks total (8 additional weeks)  PLANNED INTERVENTIONS: 97110-Therapeutic exercises, 97530- Therapeutic activity, 97112-  Neuromuscular re-education, 97535- Self Care, 16109- Manual therapy, [id number]- Vasopneumatic device, Patient/Family education, Joint mobilization,  Cryotherapy, and Moist heat  PLAN FOR NEXT SESSION: Capsular stretching, Active assistive and active range of motion activities.  Early emphasis on maintaining and improving active range of motion as her biggest issue pre-surgery was a severe adhesive capsulitis.  Scapular and rotator cuff strengthening as AROM and capsular flexibility improve. Keep working on IR/ER as this is still very stiff and limited  Terrel Ferries, PT, DPT 09/16/23 9:30 AM

## 2023-09-22 DIAGNOSIS — E113291 Type 2 diabetes mellitus with mild nonproliferative diabetic retinopathy without macular edema, right eye: Secondary | ICD-10-CM | POA: Diagnosis not present

## 2023-09-22 DIAGNOSIS — H11153 Pinguecula, bilateral: Secondary | ICD-10-CM | POA: Diagnosis not present

## 2023-09-22 DIAGNOSIS — H35362 Drusen (degenerative) of macula, left eye: Secondary | ICD-10-CM | POA: Diagnosis not present

## 2023-09-23 ENCOUNTER — Encounter: Payer: Self-pay | Admitting: Rehabilitative and Restorative Service Providers"

## 2023-09-23 ENCOUNTER — Ambulatory Visit: Admitting: Rehabilitative and Restorative Service Providers"

## 2023-09-23 DIAGNOSIS — M25512 Pain in left shoulder: Secondary | ICD-10-CM | POA: Diagnosis not present

## 2023-09-23 DIAGNOSIS — M25612 Stiffness of left shoulder, not elsewhere classified: Secondary | ICD-10-CM | POA: Diagnosis not present

## 2023-09-23 DIAGNOSIS — M6281 Muscle weakness (generalized): Secondary | ICD-10-CM

## 2023-09-23 DIAGNOSIS — R293 Abnormal posture: Secondary | ICD-10-CM

## 2023-09-23 DIAGNOSIS — G8929 Other chronic pain: Secondary | ICD-10-CM

## 2023-09-23 DIAGNOSIS — R600 Localized edema: Secondary | ICD-10-CM

## 2023-09-23 NOTE — Therapy (Signed)
 OUTPATIENT PHYSICAL THERAPY SHOULDER TREATMENT NOTE   Patient Name: [name] MRN: [id number] DOB:Dec 23, 1975, 48 y.o., female Today's Date: 09/23/2023  END OF SESSION:  PT End of Session - 09/23/23 0850     Visit Number 9    Number of Visits 24    Date for PT Re-Evaluation 10/28/23    Authorization Type AET[employer]    PT Start Time 0848    PT Stop Time 0927    PT Time Calculation (min) 39 min    Activity Tolerance Patient tolerated treatment well;Patient limited by pain;No increased pain    Behavior During Therapy WFL for tasks assessed/performed             Past Medical History:  Diagnosis Date   DM type 2 (diabetes mellitus, type 2) (HCC) 07/13/2011   Gestational diabetes    With first pregnancy, glyburide    HTN complicating peripregnancy, antepartum, third trimester 10/11/2016   Obesity (BMI 30-39.9)    Pregnancy induced hypertension    Uterine scar from previous cesarean delivery affecting pregnancy 10/11/2016   Past Surgical History:  Procedure Laterality Date   BICEPT TENODESIS Left 08/04/2023   Procedure: LEFT OPEN BICEPS TENODESIS;  Surgeon: Jasmine Mesi, MD;  Location: [facility] OR;  Service: Orthopedics;  Laterality: Left;  OPEN BICEPS TENODESIS   CESAREAN SECTION N/A 04/10/2013   Procedure: CESAREAN SECTION;  Surgeon: Ana Balling, MD;  Location: [facility] ORS;  Service: Obstetrics;  Laterality: N/A;   CESAREAN SECTION N/A 10/11/2016   Procedure: CESAREAN SECTION;  Surgeon: Margaretmary Shaver, MD;  Location: [facility];  Service: Obstetrics;  Laterality: N/A;  [name], RNFA   PERINEUM REPAIR N/A 04/10/2013   Procedure: EPISIOTOMY REPAIR;  Surgeon: Ana Balling, MD;  Location: [facility];  Service: Obstetrics;  Laterality: N/A;   POSTERIOR LUMBAR FUSION 2 WITH HARDWARE REMOVAL Left 08/04/2023   Procedure: LEFT SHOULDER ARTHROSCOPY WITH EXTENSIVE DEBRIDEMENT;  Surgeon: Jasmine Mesi, MD;  Location: [facility] OR;  Service: Orthopedics;  Laterality: Left;  LEFT SHOULDER  MANIPULATION UNDER ANESTHESIA, EXTENSIVE DEBRIDEMENT   SHOULDER CLOSED REDUCTION Left 08/04/2023   Procedure: LEFT SHOULDER MANIPULATION WITH ANESTHESIA;  Surgeon: Jasmine Mesi, MD;  Location: [facility] OR;  Service: Orthopedics;  Laterality: Left;   Patient Active Problem List   Diagnosis Date Noted   Biceps tendonitis on right 08/06/2023   Degenerative superior labral anterior-to-posterior (SLAP) tear of right shoulder 08/06/2023   Synovitis of right shoulder 08/06/2023   Adhesive capsulitis of right shoulder 08/06/2023   Adhesive capsulitis of left shoulder 03/01/2023   Dyspepsia 12/23/2021   Peeling skin 04/07/2021   Healthcare maintenance 12/22/2018   Hyperlipemia 08/26/2014   Microalbuminuria 08/26/2014   HTN (hypertension) 06/21/2013   Type 2 diabetes mellitus with microalbuminuria, without long-term current use of insulin  (HCC) 07/13/2011    PCP: Abraham Abo, MD  REFERRING PROVIDER: Jasmine Mesi, MD  REFERRING DIAG: M75.02 (ICD-10-CM) - Adhesive capsulitis of left shoulde  THERAPY DIAG:  Abnormal posture  Muscle weakness (generalized)  Localized edema  Chronic left shoulder pain  Stiffness of left shoulder, not elsewhere classified  Rationale for Evaluation and Treatment: Rehabilitation  ONSET DATE: 08/04/2023 manipulation under anesthesia with arthroscopy, extensive debridement, rotator interval release and biceps tenodesis, history of 1.5 years  SUBJECTIVE:  SUBJECTIVE STATEMENT:  [name] reports she has been working very hard with her home exercises.  Is.  She feels like flexion active range of motion is progressing while external rotation has been slower to progress.  Hand dominance: Left  PERTINENT HISTORY: Type 2 DM  PAIN:  Are you having pain? Yes: NPRS scale: 3-5/10  usually but pain can be higher at end range rotation Pain location: Left shoulder Pain description: Ache, sore Aggravating factors: maybe sleeping on it, heavier use Relieving factors: nothing   PRECAUTIONS: Other: Post-surgical/manipulation shoulder  WEIGHT BEARING RESTRICTIONS: No  FALLS:  Has patient fallen in last 6 months? No  LIVING ENVIRONMENT: Lives with: lives with their family, lives with their spouse, lives with their daughter, and and mother Lives in: House/apartment Stairs: No problem Has following equipment at home: None  OCCUPATION: Nail tech  PLOF: Independent  PATIENT GOALS:Return to normal function as quickly as possible  NEXT MD VISIT:   OBJECTIVE:  Note: Objective measures were completed at Evaluation unless otherwise noted.  DIAGNOSTIC FINDINGS:  Radiographs of her left shoulder demonstrate no acute fractures no  significant degenerative changes  PATIENT SURVEYS:  09/01/2023: Patient specific functional scale (0 unable to 10 no difficulty) 1) Reaching   4/10 2) Wash/style hair  3/10 3) Reach overhead 2/10 Total    3/30 or 10%   Evaluation: Patient specific functional scale (0 unable to 10 no difficulty) 1) Reaching   0/10 2) Wash/style hair  0/10 3) Reach overhead 0/10 Total    0/30 or 0%   POSTURE: Rounded shoulders and forward head in the sling  UPPER EXTREMITY ROM:   Passive ROM Left/Right 08/04/2023 Left 08/11/2023 AROM Left 08/30/23  Left/Right 09/01/2023 Left 09/16/23 supine  Left 09/23/2023  Shoulder flexion 150/170 140 A: 145 P: 150 140 P 140* A 150* 145  Shoulder extension        Shoulder abduction     P 100* A 150*   Shoulder horizontal adduction 35/40 35  40  45  Shoulder internal rotation 70/55 50 A at 90 deg abd: 50 P: 70 50 at 70 degrees abduction P 50* at 70 degrees ABD  50 degrees at 70 degrees abduction  Shoulder external rotation 60/105 50 A at 90 deg abd:55 P: 60 45 at 70 degrees abduction P 30* at 70 degrees ABD  50  degrees at 70 degrees abduction  Elbow flexion        Elbow extension        Wrist flexion        Wrist extension        Wrist ulnar deviation        Wrist radial deviation        Wrist pronation        Wrist supination        (Blank rows = not tested)  UPPER EXTREMITY STRENGTH: Deferred due to surgery/manipulation yesterday evening  Assessed in pounds with hand-held dynamometer Left/Right 08/05/2023 Left/Right 09/01/2023  Shoulder flexion    Shoulder extension    Shoulder abduction    Shoulder adduction    Shoulder internal rotation  15.8/24.7  Shoulder external rotation  11.2/19.8  Middle trapezius    Lower trapezius    Elbow flexion    Elbow extension    Wrist flexion    Wrist extension    Wrist ulnar deviation    Wrist radial deviation    Wrist pronation    Wrist supination    Grip strength (lbs)    (  Blank rows = not tested)                                                                                                                            TREATMENT DATE:  09/23/2023 Supine shoulder IR stretch (70 degrees abduction), elbow on pillows so it is slightly above shoulder height and reach towards pants pocket 15 x 10 seconds Supine shoulder ER stretch (70 degrees abduction), elbow on pillows so it is slightly above shoulder height and reach back towards mat like reaching for a seatbelt 15 x 10 seconds Thera-Band ER and IR Green 10 x each direction with slow eccentrics  Functional Activities: Supine shoulder flexion, reach up (protract shoulder), palm facing in 15 x 10 seconds for reaching and overhead function (5 x with right side overpressure, 5 x with 1# dowel, 5 x with 1# weight) Thumb up the back stretch for internal rotation/behind the back with strap (belt, backseat) function 15 x 10 seconds   09/16/23 ROM check/goal update, education on status of shoulder, progression with PT, time needed after shoulder surgery to heal and recovery properly for maximal functional  return   PROM/stretching with GH mobs to tolerance all directions as appropriate   AAROM flexion 12x3 seconds  Serratus punches 0# 15x2 second holds Sleeper stretch for IR ROM 4x30 seconds Supine shoulder flexion 2# 2x10  Sidelying shoulder ABD 2# 2x10  Wall ladder for flexion 10x3 seconds L    09/01/2023 -Supine shoulder IR stretch (70 degrees abduction), elbow on pillows so it is slightly above shoulder height and reach towards pants pocket 20 x 10 seconds -Supine shoulder ER stretch (70 degrees abduction), elbow on pillows so it is slightly above shoulder height and reach back towards mat like reaching for a seatbelt 20 x 10 seconds -Thera-Band ER and IR Green 10 x each direction with slow eccentrics  Functional Activities: -Supine shoulder flexion, reach up (protract shoulder), palm facing in 20 x 10 seconds for reaching and overhead function -Thumb up the back stretch for internal rotation/behind the back (belt, backseat) function 10 x 10 seconds   PATIENT EDUCATION: Education details: See above Person educated: Patient and Spouse Education method: Explanation, Demonstration, Tactile cues, Verbal cues, and Handouts Education comprehension: verbalized understanding, returned demonstration, verbal cues required, tactile cues required, and needs further education  HOME EXERCISE PROGRAM: Access Code: [email or web address] URL: [email or web address] Date: 08/25/2023 Prepared by: Gellen April Erman [place]  Exercises - Seated Shoulder Flexion AAROM with Pulley Behind  - 1 x daily - 7 x weekly - 1 sets - 10 reps - 10 seconds hold - Supine Scapular Protraction in Flexion with Dumbbells  - 2-3 x daily - 7 x weekly - 1 sets - 20 reps - 3 seconds hold - Supine Shoulder External Rotation Stretch  - 2-3 x daily - 7 x weekly - 1 sets - 10-20 reps - 10 seconds hold - Supine Shoulder Internal Rotation Stretch  - 2-3 x daily - 7  x weekly - 1 sets - 10-20 reps - 10 seconds hold - Supine  Chest Stretch with Elbows Bent  - 1 x daily - 7 x weekly - 1 sets - 10 reps - Standing Shoulder External Rotation Stretch in Doorway  - 1 x daily - 7 x weekly - 1 sets - 10 reps - Shoulder External Rotation and Scapular Retraction with Resistance  - 1 x daily - 7 x weekly - 2 sets - 10 reps - Shoulder Flexion Wall Slide with Towel  - 1 x daily - 7 x weekly - 2 sets - 10 reps - Standing Shoulder Abduction Slides at Wall  - 1 x daily - 7 x weekly - 2 sets - 10 reps - Scaption Wall Slide with Towel  - 1 x daily - 7 x weekly - 2 sets - 10 reps  ASSESSMENT:  CLINICAL IMPRESSION:  Graciana showed improvement objectively in 3 of the 4 areas I assessed today.  Only internal rotation active range of motion was the same as it was the last time I saw her.  We reviewed her home exercises with emphasis on internal and external rotation, including a thumb up the back stretch to help with her behind the back function and internal rotation active range of motion.  Seleni has a history of left shoulder stiffness that required manipulation, although she is slowly making objective progress.  I encouraged Patriciaann to focus on 3 exercises that are addressing her most significant capsular inflexibilities and told her we would reassess next week.  EVAL: Patient is a 48 y.o. female who was seen today for physical therapy evaluation and treatment for s/p left shoulder manipulation under anesthesia with arthroscopy, extensive debridement, rotator interval release and mini-open biceps tenodesis.  Sara had her procedure last night and she is still feeling the effects of the nerve block with her left upper extremity.  Early emphasis will be on range of motion to make sure she does not freeze back up regards to her range of motion.  Appropriate scapular and rotator cuff strengthening will be implemented as appropriate to facilitate meeting the below listed long-term goals.  OBJECTIVE IMPAIRMENTS: decreased activity tolerance, decreased  endurance, decreased knowledge of condition, decreased ROM, decreased strength, increased edema, impaired perceived functional ability, impaired sensation, impaired UE functional use, and pain.   ACTIVITY LIMITATIONS: carrying, lifting, sleeping, dressing, and reach over head  PARTICIPATION LIMITATIONS: meal prep, cleaning, laundry, driving, shopping, community activity, and occupation  PERSONAL FACTORS: Type 2 DM is also affecting patient's functional outcome.   REHAB POTENTIAL: Good  CLINICAL DECISION MAKING: Stable/uncomplicated  EVALUATION COMPLEXITY: Low   GOALS: Goals reviewed with patient? Yes  SHORT TERM GOALS: Target date: 09/16/2023  Aalliyah will be independent with her day 1 home exercise program Baseline: Started 08/05/2023 Goal status: Met 09/23/2023  2.  Improve left shoulder active range of motion for flexion to 150 degrees; IR to 70 degrees; ER to 70 degrees and horizontal adduction to 40 degrees Baseline: Passive range of motion 150; 70; 60 and 35 respectively Goal status: Partially Met 09/23/23   LONG TERM GOALS: Target date: 10/28/2023  Improve patient specific functional score to at least 70% Baseline: 0% Goal status: INITIAL  2.  Adamariz will report left shoulder pain consistently 0-3/10 on numeric pain rating scale Baseline: Left upper extremity was completely numb due to a nerve block at evaluation Goal status: On Going 09/23/2023  3.  Improve left shoulder active range of motion to 90% of  flexion at 170; IR at 60; ER to 70 and horizontal adduction to 40 degrees Baseline: See objective Goal status: INITIAL  4.  Improve left shoulder strength to at least 60% of the uninvolved right Baseline: Strength testing deferred at evaluation secondary to her procedure being less than 24 hours ago Goal status: INITIAL  5.  Jalin will be independent with her long-term home maintenance exercise program at discharge Baseline: Started 08/05/2023 Goal status:  INITIAL  PLAN:  PT FREQUENCY: 2-3 times a week  PT DURATION: 12 weeks total (8 additional weeks)  PLANNED INTERVENTIONS: 97110-Therapeutic exercises, 97530- Therapeutic activity, 97112- Neuromuscular re-education, 97535- Self Care, 44034- Manual therapy, [id number]- Vasopneumatic device, Patient/Family education, Joint mobilization, Cryotherapy, and Moist heat  PLAN FOR NEXT SESSION: Capsular stretching, Active assistive and active range of motion activities.  Early emphasis on maintaining and improving active range of motion as her biggest issue pre-surgery was a severe adhesive capsulitis.  Scapular and rotator cuff strengthening as AROM and capsular flexibility improve. Keep working on IR/ER as this is still very stiff and limited.  Joli Neas PT, MPT 09/23/23 12:30 PM

## 2023-09-27 ENCOUNTER — Encounter: Payer: Self-pay | Admitting: Rehabilitative and Restorative Service Providers"

## 2023-09-27 ENCOUNTER — Ambulatory Visit: Admitting: Rehabilitative and Restorative Service Providers"

## 2023-09-27 DIAGNOSIS — R600 Localized edema: Secondary | ICD-10-CM | POA: Diagnosis not present

## 2023-09-27 DIAGNOSIS — M25612 Stiffness of left shoulder, not elsewhere classified: Secondary | ICD-10-CM | POA: Diagnosis not present

## 2023-09-27 DIAGNOSIS — M6281 Muscle weakness (generalized): Secondary | ICD-10-CM

## 2023-09-27 DIAGNOSIS — M25512 Pain in left shoulder: Secondary | ICD-10-CM | POA: Diagnosis not present

## 2023-09-27 DIAGNOSIS — G8929 Other chronic pain: Secondary | ICD-10-CM

## 2023-09-27 DIAGNOSIS — R293 Abnormal posture: Secondary | ICD-10-CM

## 2023-09-27 NOTE — Therapy (Signed)
 OUTPATIENT PHYSICAL THERAPY SHOULDER TREATMENT NOTE   Patient Name: [name] MRN: [id number] DOB:Oct 15, 1975, 48 y.o., female Today's Date: 09/27/2023  END OF SESSION:  PT End of Session - 09/27/23 0935     Visit Number 10    Number of Visits 24    Date for PT Re-Evaluation 10/28/23    Authorization Type [employer]ET[employer]    PT Start Time 0935    PT Stop Time 1015    PT Time Calculation (min) 40 min    Activity Tolerance Patient tolerated treatment well;Patient limited by pain;No increased pain    Behavior During Therapy WFL for tasks assessed/performed           Past Medical History:  Diagnosis Date   DM type 2 (diabetes mellitus, type 2) (HCC) 07/13/2011   Gestational diabetes    With first pregnancy, glyburide    HTN complicating peripregnancy, antepartum, third trimester 10/11/2016   Obesity (BMI 30-39.9)    Pregnancy induced hypertension    Uterine scar from previous cesarean delivery affecting pregnancy 10/11/2016   Past Surgical History:  Procedure Laterality Date   BICEPT TENODESIS Left 08/04/2023   Procedure: LEFT OPEN BICEPS TENODESIS;  Surgeon: Jasmine Mesi, MD;  Location: [facility] OR;  Service: Orthopedics;  Laterality: Left;  OPEN BICEPS TENODESIS   CESAREAN SECTION N/A 04/10/2013   Procedure: CESAREAN SECTION;  Surgeon: Ana Balling, MD;  Location: [facility] ORS;  Service: Obstetrics;  Laterality: N/A;   CESAREAN SECTION N/A 10/11/2016   Procedure: CESAREAN SECTION;  Surgeon: Margaretmary Shaver, MD;  Location: [facility];  Service: Obstetrics;  Laterality: N/A;  [name], RNFA   PERINEUM REPAIR N/A 04/10/2013   Procedure: EPISIOTOMY REPAIR;  Surgeon: Ana Balling, MD;  Location: [facility];  Service: Obstetrics;  Laterality: N/A;   POSTERIOR LUMBAR FUSION 2 WITH HARDWARE REMOVAL Left 08/04/2023   Procedure: LEFT SHOULDER ARTHROSCOPY WITH EXTENSIVE DEBRIDEMENT;  Surgeon: Jasmine Mesi, MD;  Location: [facility] OR;  Service: Orthopedics;  Laterality: Left;  LEFT SHOULDER MANIPULATION  UNDER ANESTHESIA, EXTENSIVE DEBRIDEMENT   SHOULDER CLOSED REDUCTION Left 08/04/2023   Procedure: LEFT SHOULDER MANIPULATION WITH ANESTHESIA;  Surgeon: Jasmine Mesi, MD;  [facility];  Service: Orthopedics;  Laterality: Left;   Patient Active Problem List   Diagnosis Date Noted   Biceps tendonitis on right 08/06/2023   Degenerative superior labral anterior-to-posterior (SLAP) tear of right shoulder 08/06/2023   Synovitis of right shoulder 08/06/2023   Adhesive capsulitis of right shoulder 08/06/2023   Adhesive capsulitis of left shoulder 03/01/2023   Dyspepsia 12/23/2021   Peeling skin 04/07/2021   Healthcare maintenance 12/22/2018   Hyperlipemia 08/26/2014   Microalbuminuria 08/26/2014   HTN (hypertension) 06/21/2013   Type 2 diabetes mellitus with microalbuminuria, without long-term current use of insulin  (HCC) 07/13/2011    PCP: Abraham Abo, MD  REFERRING PROVIDER: Jasmine Mesi, MD  REFERRING DIAG: M75.02 (ICD-10-CM) - Adhesive capsulitis of left shoulde  THERAPY DIAG:  Abnormal posture  Muscle weakness (generalized)  Localized edema  Chronic left shoulder pain  Stiffness of left shoulder, not elsewhere classified  Rationale for Evaluation and Treatment: Rehabilitation  ONSET DATE: 08/04/2023 manipulation under anesthesia with arthroscopy, extensive debridement, rotator interval release and biceps tenodesis, history of 1.5 years  SUBJECTIVE:  SUBJECTIVE STATEMENT:  [name] continues to work very hard with her home exercises.  Is.  Thumb up the back (IR) is most limiting).  Hand dominance: Left  PERTINENT HISTORY: Type 2 DM  PAIN:  Are you having pain? Yes: NPRS scale: 3-5/10 usually but pain can be higher at end range rotation Pain location: Left shoulder Pain  description: Ache, sore Aggravating factors: maybe sleeping on it, heavier use Relieving factors: nothing   PRECAUTIONS: Other: Post-surgical/manipulation shoulder  WEIGHT BEARING RESTRICTIONS: No  FALLS:  Has patient fallen in last 6 months? No  LIVING ENVIRONMENT: Lives with: lives with their family, lives with their spouse, lives with their daughter, and and mother Lives in: House/apartment Stairs: No problem Has following equipment at home: None  OCCUPATION: Nail tech  PLOF: Independent  PATIENT GOALS:Return to normal function as quickly as possible  NEXT MD VISIT:   OBJECTIVE:  Note: Objective measures were completed at Evaluation unless otherwise noted.  DIAGNOSTIC FINDINGS:  Radiographs of her left shoulder demonstrate no acute fractures no  significant degenerative changes  PATIENT SURVEYS:  09/27/2023: Patient specific functional scale (0 unable to 10 no difficulty) 1) Reaching   8/10 2) Wash/style hair  10/10 3) Reach overhead 8/10 Total    26/30 or 8.67 Avg.  09/01/2023: Patient specific functional scale (0 unable to 10 no difficulty) 1) Reaching   4/10 2) Wash/style hair  3/10 3) Reach overhead 2/10 Total    3/30 or 10%   Evaluation: Patient specific functional scale (0 unable to 10 no difficulty) 1) Reaching   0/10 2) Wash/style hair  0/10 3) Reach overhead 0/10 Total    0/30 or 0%   POSTURE: Rounded shoulders and forward head in the sling  UPPER EXTREMITY ROM:   Passive ROM Left/Right 08/04/2023 Left 08/11/2023 AROM Left 08/30/23  Left/Right 09/01/2023 Left 09/16/23 supine  Left 09/23/2023 Left 09/27/2023  Shoulder flexion 150/170 140 A: 145 P: 150 140 P 140* A 150* 145 145  Shoulder extension         Shoulder abduction     P 100* A 150*    Shoulder horizontal adduction 35/40 35  40  45 45  Shoulder internal rotation 70/55 50 A at 90 deg abd: 50 P: 70 50 at 70 degrees abduction P 50* at 70 degrees ABD  50 degrees at 70 degrees abduction 50  degrees at 70 degrees abduction  Shoulder external rotation 60/105 50 A at 90 deg abd:55 P: 60 45 at 70 degrees abduction P 30* at 70 degrees ABD  50 degrees at 70 degrees abduction 55 degrees at 70 degrees abduction  Elbow flexion         Elbow extension         Wrist flexion         Wrist extension         Wrist ulnar deviation         Wrist radial deviation         Wrist pronation         Wrist supination         (Blank rows = not tested)  UPPER EXTREMITY STRENGTH: Deferred due to surgery/manipulation yesterday evening  Assessed in pounds with hand-held dynamometer Left/Right 08/05/2023 Left/Right 09/01/2023  Shoulder flexion    Shoulder extension    Shoulder abduction    Shoulder adduction    Shoulder internal rotation  15.8/24.7  Shoulder external rotation  11.2/19.8  Middle trapezius    Lower trapezius  Elbow flexion    Elbow extension    Wrist flexion    Wrist extension    Wrist ulnar deviation    Wrist radial deviation    Wrist pronation    Wrist supination    Grip strength (lbs)    (Blank rows = not tested)                                                                                                                            TREATMENT DATE:  09/27/2023 Supine shoulder IR stretch (70 degrees abduction), elbow on pillows so it is slightly above shoulder height and reach towards pants pocket 20 x 10 seconds Supine shoulder ER stretch (70 degrees abduction), elbow on pillows so it is slightly above shoulder height and reach back towards mat like reaching for a seatbelt 20 x 10 seconds Thera-Band ER and IR Green 10 x each direction with slow eccentrics  Functional Activities: Supine shoulder flexion, reach up (protract shoulder), palm facing in 20 x 10 seconds for reaching and overhead function (with 1# dowel) Thumb up the back stretch for internal rotation/behind the back with strap (belt, backseat) function 10 x 10 seconds   09/23/2023 Supine shoulder IR stretch  (70 degrees abduction), elbow on pillows so it is slightly above shoulder height and reach towards pants pocket 15 x 10 seconds Supine shoulder ER stretch (70 degrees abduction), elbow on pillows so it is slightly above shoulder height and reach back towards mat like reaching for a seatbelt 15 x 10 seconds Thera-Band ER and IR Green 10 x each direction with slow eccentrics  Functional Activities: Supine shoulder flexion, reach up (protract shoulder), palm facing in 15 x 10 seconds for reaching and overhead function (5 x with right side overpressure, 5 x with 1# dowel, 5 x with 1# weight) Thumb up the back stretch for internal rotation/behind the back with strap (belt, backseat) function 15 x 10 seconds   09/16/23 ROM check/goal update, education on status of shoulder, progression with PT, time needed after shoulder surgery to heal and recovery properly for maximal functional return   PROM/stretching with GH mobs to tolerance all directions as appropriate   AAROM flexion 12x3 seconds  Serratus punches 0# 15x2 second holds Sleeper stretch for IR ROM 4x30 seconds Supine shoulder flexion 2# 2x10  Sidelying shoulder ABD 2# 2x10  Wall ladder for flexion 10x3 seconds L    PATIENT EDUCATION: Education details: See above Person educated: Patient and Spouse Education method: Explanation, Demonstration, Tactile cues, Verbal cues, and Handouts Education comprehension: verbalized understanding, returned demonstration, verbal cues required, tactile cues required, and needs further education  HOME EXERCISE PROGRAM: Access Code: [email or web address] URL: [email or web address] Date: 08/25/2023 Prepared by: Gellen April Erman [place]  Exercises - Seated Shoulder Flexion AAROM with Pulley Behind  - 1 x daily - 7 x weekly - 1 sets - 10 reps - 10 seconds hold - Supine Scapular Protraction in Flexion with Dumbbells  - 2-3 x  daily - 7 x weekly - 1 sets - 20 reps - 3 seconds hold - Supine Shoulder  External Rotation Stretch  - 2-3 x daily - 7 x weekly - 1 sets - 10-20 reps - 10 seconds hold - Supine Shoulder Internal Rotation Stretch  - 2-3 x daily - 7 x weekly - 1 sets - 10-20 reps - 10 seconds hold - Supine Chest Stretch with Elbows Bent  - 1 x daily - 7 x weekly - 1 sets - 10 reps - Standing Shoulder External Rotation Stretch in Doorway  - 1 x daily - 7 x weekly - 1 sets - 10 reps - Shoulder External Rotation and Scapular Retraction with Resistance  - 1 x daily - 7 x weekly - 2 sets - 10 reps - Shoulder Flexion Wall Slide with Towel  - 1 x daily - 7 x weekly - 2 sets - 10 reps - Standing Shoulder Abduction Slides at Wall  - 1 x daily - 7 x weekly - 2 sets - 10 reps - Scaption Wall Slide with Towel  - 1 x daily - 7 x weekly - 2 sets - 10 reps  ASSESSMENT:  CLINICAL IMPRESSION:  Marika continues to make objective progress, even as compared to < a week ago.  ER AROM was assessed at 55 degrees today (50 last week) while IR continues to progress at a slower pace.  IR is an emphasis at home and her prognosis to meet all long-term goals is good given her current effort and participation.  I encouraged Dorothe to continue to focus on 3 exercises that are addressing her most significant capsular inflexibilities.  EVAL: Patient is a 47 y.o. female who was seen today for physical therapy evaluation and treatment for s/p left shoulder manipulation under anesthesia with arthroscopy, extensive debridement, rotator interval release and mini-open biceps tenodesis.  Lorna had her procedure last night and she is still feeling the effects of the nerve block with her left upper extremity.  Early emphasis will be on range of motion to make sure she does not freeze back up regards to her range of motion.  Appropriate scapular and rotator cuff strengthening will be implemented as appropriate to facilitate meeting the below listed long-term goals.  OBJECTIVE IMPAIRMENTS: decreased activity tolerance, decreased  endurance, decreased knowledge of condition, decreased ROM, decreased strength, increased edema, impaired perceived functional ability, impaired sensation, impaired UE functional use, and pain.   ACTIVITY LIMITATIONS: carrying, lifting, sleeping, dressing, and reach over head  PARTICIPATION LIMITATIONS: meal prep, cleaning, laundry, driving, shopping, community activity, and occupation  PERSONAL FACTORS: Type 2 DM is also affecting patient's functional outcome.   REHAB POTENTIAL: Good  CLINICAL DECISION MAKING: Stable/uncomplicated  EVALUATION COMPLEXITY: Low   GOALS: Goals reviewed with patient? Yes  SHORT TERM GOALS: Target date: 09/16/2023  Kirstan will be independent with her day 1 home exercise program Baseline: Started 08/05/2023 Goal status: Met 09/23/2023  2.  Improve left shoulder active range of motion for flexion to 150 degrees; IR to 70 degrees; ER to 70 degrees and horizontal adduction to 40 degrees Baseline: Passive range of motion 150; 70; 60 and 35 respectively Goal status: Partially Met 09/27/23   LONG TERM GOALS: Target date: 10/28/2023  Improve patient specific functional score to at least 70% Baseline: 0% Goal status: Met 09/27/2023  2.  Aminat will report left shoulder pain consistently 0-3/10 on numeric pain rating scale Baseline: Left upper extremity was completely numb due to a nerve block at  evaluation Goal status: Partially Met 09/27/2023  3.  Improve left shoulder active range of motion to 90% of flexion at 170; IR at 60; ER to 70 and horizontal adduction to 40 degrees Baseline: See objective Goal status: INITIAL  4.  Improve left shoulder strength to at least 60% of the uninvolved right Baseline: Strength testing deferred at evaluation secondary to her procedure being less than 24 hours ago Goal status: INITIAL  5.  Gianah will be independent with her long-term home maintenance exercise program at discharge Baseline: Started 08/05/2023 Goal status:  INITIAL  PLAN:  PT FREQUENCY: 2-3 times a week  PT DURATION: 12 weeks total (8 additional weeks)  PLANNED INTERVENTIONS: 97110-Therapeutic exercises, 97530- Therapeutic activity, 97112- Neuromuscular re-education, 97535- Self Care, [id number]- Manual therapy, [id number]- Vasopneumatic device, Patient/Family education, Joint mobilization, Cryotherapy, and Moist heat  PLAN FOR NEXT SESSION: Capsular stretching, Active assistive and active range of motion activities.  Early emphasis on maintaining and improving active range of motion as her biggest issue pre-surgery was a severe adhesive capsulitis.  Scapular and rotator cuff strengthening as AROM and capsular flexibility improve. Keep working on IR/ER as this is still very stiff and limited.  Joli Neas PT, MPT 09/27/23 10:15 AM

## 2023-09-29 ENCOUNTER — Ambulatory Visit: Admitting: Rehabilitative and Restorative Service Providers"

## 2023-09-29 ENCOUNTER — Encounter: Payer: Self-pay | Admitting: Rehabilitative and Restorative Service Providers"

## 2023-09-29 DIAGNOSIS — R293 Abnormal posture: Secondary | ICD-10-CM | POA: Diagnosis not present

## 2023-09-29 DIAGNOSIS — G8929 Other chronic pain: Secondary | ICD-10-CM | POA: Diagnosis not present

## 2023-09-29 DIAGNOSIS — M6281 Muscle weakness (generalized): Secondary | ICD-10-CM

## 2023-09-29 DIAGNOSIS — M25612 Stiffness of left shoulder, not elsewhere classified: Secondary | ICD-10-CM

## 2023-09-29 DIAGNOSIS — M25512 Pain in left shoulder: Secondary | ICD-10-CM

## 2023-09-29 DIAGNOSIS — R600 Localized edema: Secondary | ICD-10-CM

## 2023-09-29 NOTE — Therapy (Signed)
 OUTPATIENT PHYSICAL THERAPY SHOULDER TREATMENT NOTE   Patient Name: [name] MRN: [id number] DOB:[birth year], 48 y.o., female Today's Date: 09/29/2023  END OF SESSION:  PT End of Session - 09/29/23 0936     Visit Number 11    Number of Visits 24    Date for PT Re-Evaluation 10/28/23    Authorization Type AET[employer]    PT Start Time 0935    PT Stop Time 1014    PT Time Calculation (min) 39 min    Activity Tolerance Patient tolerated treatment well;Patient limited by pain;No increased pain    Behavior During Therapy WFL for tasks assessed/performed            Past Medical History:  Diagnosis Date   DM type 2 (diabetes mellitus, type 2) (HCC) 07/13/2011   Gestational diabetes    With first pregnancy, glyburide    HTN complicating peripregnancy, antepartum, third trimester 10/11/2016   Obesity (BMI 30-39.9)    Pregnancy induced hypertension    Uterine scar from previous cesarean delivery affecting pregnancy 10/11/2016   Past Surgical History:  Procedure Laterality Date   BICEPT TENODESIS Left 08/04/2023   Procedure: LEFT OPEN BICEPS TENODESIS;  Surgeon: Jasmine Mesi, MD;  Location: [facility] OR;  Service: Orthopedics;  Laterality: Left;  OPEN BICEPS TENODESIS   CESAREAN SECTION N/A 04/10/2013   Procedure: CESAREAN SECTION;  Surgeon: Ana Balling, MD;  Location: [facility];  Service: Obstetrics;  Laterality: N/A;   CESAREAN SECTION N/A 10/11/2016   Procedure: CESAREAN SECTION;  Surgeon: Margaretmary Shaver, MD;  Location: [facility];  Service: Obstetrics;  Laterality: N/A;  [name], RNFA   PERINEUM REPAIR N/A 04/10/2013   Procedure: EPISIOTOMY REPAIR;  Surgeon: Ana Balling, MD;  Location: [facility];  Service: Obstetrics;  Laterality: N/A;   POSTERIOR LUMBAR FUSION 2 WITH HARDWARE REMOVAL Left 08/04/2023   Procedure: LEFT SHOULDER ARTHROSCOPY WITH EXTENSIVE DEBRIDEMENT;  Surgeon: Jasmine Mesi, MD;  Location: [facility] OR;  Service: Orthopedics;  Laterality: Left;  LEFT SHOULDER  MANIPULATION UNDER ANESTHESIA, EXTENSIVE DEBRIDEMENT   SHOULDER CLOSED REDUCTION Left 08/04/2023   Procedure: LEFT SHOULDER MANIPULATION WITH ANESTHESIA;  Surgeon: Jasmine Mesi, MD;  Location: [facility] OR;  Service: Orthopedics;  Laterality: Left;   Patient Active Problem List   Diagnosis Date Noted   Biceps tendonitis on right 08/06/2023   Degenerative superior labral anterior-to-posterior (SLAP) tear of right shoulder 08/06/2023   Synovitis of right shoulder 08/06/2023   Adhesive capsulitis of right shoulder 08/06/2023   Adhesive capsulitis of left shoulder 03/01/2023   Dyspepsia 12/23/2021   Peeling skin 04/07/2021   Healthcare maintenance 12/22/2018   Hyperlipemia 08/26/2014   Microalbuminuria 08/26/2014   HTN (hypertension) 06/21/2013   Type 2 diabetes mellitus with microalbuminuria, without long-term current use of insulin  (HCC) 07/13/2011    PCP: Abraham Abo, MD  REFERRING PROVIDER: Jasmine Mesi, MD  REFERRING DIAG: M75.02 (ICD-10-CM) - Adhesive capsulitis of left shoulde  THERAPY DIAG:  Abnormal posture  Muscle weakness (generalized)  Localized edema  Chronic left shoulder pain  Stiffness of left shoulder, not elsewhere classified  Rationale for Evaluation and Treatment: Rehabilitation  ONSET DATE: 08/04/2023 manipulation under anesthesia with arthroscopy, extensive debridement, rotator interval release and biceps tenodesis, history of 1.5 years  SUBJECTIVE:  SUBJECTIVE STATEMENT:  Cartier is doing a good job with her home exercises and she feels like she is making progress.  Thumb up the back (IR) is most limiting, while ER is most uncomfortable.  Hand dominance: Left  PERTINENT HISTORY: Type 2 DM  PAIN:  Are you having pain? Yes: NPRS scale: 3-5/10 usually but pain can be  higher at end range rotation Pain location: Left shoulder Pain description: Ache, sore Aggravating factors: maybe sleeping on it, heavier use Relieving factors: nothing   PRECAUTIONS: Other: Post-surgical/manipulation shoulder  WEIGHT BEARING RESTRICTIONS: No  FALLS:  Has patient fallen in last 6 months? No  LIVING ENVIRONMENT: Lives with: lives with their family, lives with their spouse, lives with their daughter, and and mother Lives in: House/apartment Stairs: No problem Has following equipment at home: None  OCCUPATION: Nail tech  PLOF: Independent  PATIENT GOALS:Return to normal function as quickly as possible  NEXT MD VISIT:   OBJECTIVE:  Note: Objective measures were completed at Evaluation unless otherwise noted.  DIAGNOSTIC FINDINGS:  Radiographs of her left shoulder demonstrate no acute fractures no  significant degenerative changes  PATIENT SURVEYS:  09/27/2023: Patient specific functional scale (0 unable to 10 no difficulty) 1) Reaching   8/10 2) Wash/style hair  10/10 3) Reach overhead 8/10 Total    26/30 or 8.67 Avg.  09/01/2023: Patient specific functional scale (0 unable to 10 no difficulty) 1) Reaching   4/10 2) Wash/style hair  3/10 3) Reach overhead 2/10 Total    3/30 or 10%   Evaluation: Patient specific functional scale (0 unable to 10 no difficulty) 1) Reaching   0/10 2) Wash/style hair  0/10 3) Reach overhead 0/10 Total    0/30 or 0%   POSTURE: Rounded shoulders and forward head in the sling  UPPER EXTREMITY ROM:   Passive ROM Left/Right 08/04/2023 Left 08/11/2023 AROM Left 08/30/23  Left/Right 09/01/2023 Left 09/16/23 supine  Left 09/23/2023 Left 09/27/2023 Left 09/29/2023  Shoulder flexion 150/170 140 A: 145 P: 150 140 P 140* A 150* 145 145 145  Shoulder extension          Shoulder abduction     P 100* A 150*     Shoulder horizontal adduction 35/40 35  40  45 45 45  Shoulder internal rotation 70/55 50 A at 90 deg abd: 50 P: 70 50  at 70 degrees abduction P 50* at 70 degrees ABD  50 degrees at 70 degrees abduction 50 degrees at 70 degrees abduction 55 degrees at 70 degrees abduction  Shoulder external rotation 60/105 50 A at 90 deg abd:55 P: 60 45 at 70 degrees abduction P 30* at 70 degrees ABD  50 degrees at 70 degrees abduction 55 degrees at 70 degrees abduction 60 degrees at 70 degrees abduction  Elbow flexion          Elbow extension          Wrist flexion          Wrist extension          Wrist ulnar deviation          Wrist radial deviation          Wrist pronation          Wrist supination          (Blank rows = not tested)  UPPER EXTREMITY STRENGTH: Deferred due to surgery/manipulation yesterday evening  Assessed in pounds with hand-held dynamometer Left/Right 08/05/2023 Left/Right 09/01/2023  Shoulder flexion  Shoulder extension    Shoulder abduction    Shoulder adduction    Shoulder internal rotation  15.8/24.7  Shoulder external rotation  11.2/19.8  Middle trapezius    Lower trapezius    Elbow flexion    Elbow extension    Wrist flexion    Wrist extension    Wrist ulnar deviation    Wrist radial deviation    Wrist pronation    Wrist supination    Grip strength (lbs)    (Blank rows = not tested)                                                                                                                            TREATMENT DATE:  09/29/2023 Supine shoulder IR stretch (70 degrees abduction), elbow on pillows so it is slightly above shoulder height and reach towards pants pocket 20 x 10 seconds Supine shoulder ER stretch (70 degrees abduction), elbow on pillows so it is slightly above shoulder height and reach back towards mat like reaching for a seatbelt 20 x 10 seconds Thera-Band ER and IR Green 10 x each direction with slow eccentrics  Functional Activities: Supine shoulder flexion, reach up (protract shoulder), palm facing in 15 x 10 seconds for reaching and overhead function (with 1#  dowel) Thumb up the back stretch for internal rotation/behind the back with strap (belt, backseat) function 15 x 10 seconds   09/27/2023 Supine shoulder IR stretch (70 degrees abduction), elbow on pillows so it is slightly above shoulder height and reach towards pants pocket 20 x 10 seconds Supine shoulder ER stretch (70 degrees abduction), elbow on pillows so it is slightly above shoulder height and reach back towards mat like reaching for a seatbelt 20 x 10 seconds Thera-Band ER and IR Green 10 x each direction with slow eccentrics  Functional Activities: Supine shoulder flexion, reach up (protract shoulder), palm facing in 20 x 10 seconds for reaching and overhead function (with 1# dowel) Thumb up the back stretch for internal rotation/behind the back with strap (belt, backseat) function 10 x 10 seconds   09/23/2023 Supine shoulder IR stretch (70 degrees abduction), elbow on pillows so it is slightly above shoulder height and reach towards pants pocket 15 x 10 seconds Supine shoulder ER stretch (70 degrees abduction), elbow on pillows so it is slightly above shoulder height and reach back towards mat like reaching for a seatbelt 15 x 10 seconds Thera-Band ER and IR Green 10 x each direction with slow eccentrics  Functional Activities: Supine shoulder flexion, reach up (protract shoulder), palm facing in 15 x 10 seconds for reaching and overhead function (5 x with right side overpressure, 5 x with 1# dowel, 5 x with 1# weight) Thumb up the back stretch for internal rotation/behind the back with strap (belt, backseat) function 15 x 10 seconds   PATIENT EDUCATION: Education details: See above Person educated: Patient and Spouse Education method: Explanation, Demonstration, Tactile cues, Verbal cues, and Handouts Education  comprehension: verbalized understanding, returned demonstration, verbal cues required, tactile cues required, and needs further education  HOME EXERCISE PROGRAM: Access  Code: [email or web address] URL: [email or web address] Date: 08/25/2023 Prepared by: Gellen April Erman [place]  Exercises - Seated Shoulder Flexion AAROM with Pulley Behind  - 1 x daily - 7 x weekly - 1 sets - 10 reps - 10 seconds hold - Supine Scapular Protraction in Flexion with Dumbbells  - 2-3 x daily - 7 x weekly - 1 sets - 20 reps - 3 seconds hold - Supine Shoulder External Rotation Stretch  - 2-3 x daily - 7 x weekly - 1 sets - 10-20 reps - 10 seconds hold - Supine Shoulder Internal Rotation Stretch  - 2-3 x daily - 7 x weekly - 1 sets - 10-20 reps - 10 seconds hold - Supine Chest Stretch with Elbows Bent  - 1 x daily - 7 x weekly - 1 sets - 10 reps - Standing Shoulder External Rotation Stretch in Doorway  - 1 x daily - 7 x weekly - 1 sets - 10 reps - Shoulder External Rotation and Scapular Retraction with Resistance  - 1 x daily - 7 x weekly - 2 sets - 10 reps - Shoulder Flexion Wall Slide with Towel  - 1 x daily - 7 x weekly - 2 sets - 10 reps - Standing Shoulder Abduction Slides at Wall  - 1 x daily - 7 x weekly - 2 sets - 10 reps - Scaption Wall Slide with Towel  - 1 x daily - 7 x weekly - 2 sets - 10 reps  ASSESSMENT:  CLINICAL IMPRESSION:  Georgeanna continues to make objective progress, even as compared to 2 days ago.  ER AROM was assessed at 60 degrees today (50 last week) while IR was 55 (was 50 2 days ago).  AROM and capsular flexibility remain the emphasis at home and her prognosis to meet all long-term goals is very good given her current effort and participation.  I encouraged Brytni to continue to focus on 4 exercises (supine ER, supine IR, supine flexion and thumb up the back) that are addressing her most significant capsular inflexibilities.  EVAL: Patient is a 48 y.o. female who was seen today for physical therapy evaluation and treatment for s/p left shoulder manipulation under anesthesia with arthroscopy, extensive debridement, rotator interval release and mini-open biceps  tenodesis.  Afsana had her procedure last night and she is still feeling the effects of the nerve block with her left upper extremity.  Early emphasis will be on range of motion to make sure she does not freeze back up regards to her range of motion.  Appropriate scapular and rotator cuff strengthening will be implemented as appropriate to facilitate meeting the below listed long-term goals.  OBJECTIVE IMPAIRMENTS: decreased activity tolerance, decreased endurance, decreased knowledge of condition, decreased ROM, decreased strength, increased edema, impaired perceived functional ability, impaired sensation, impaired UE functional use, and pain.   ACTIVITY LIMITATIONS: carrying, lifting, sleeping, dressing, and reach over head  PARTICIPATION LIMITATIONS: meal prep, cleaning, laundry, driving, shopping, community activity, and occupation  PERSONAL FACTORS: Type 2 DM is also affecting patient's functional outcome.   REHAB POTENTIAL: Good  CLINICAL DECISION MAKING: Stable/uncomplicated  EVALUATION COMPLEXITY: Low   GOALS: Goals reviewed with patient? Yes  SHORT TERM GOALS: Target date: 09/16/2023  Kismet will be independent with her day 1 home exercise program Baseline: Started 08/05/2023 Goal status: Met 09/23/2023  2.  Improve left shoulder active range of motion for  flexion to 150 degrees; IR to 70 degrees; ER to 70 degrees and horizontal adduction to 40 degrees Baseline: Passive range of motion 150; 70; 60 and 35 respectively Goal status: Partially Met 09/29/23   LONG TERM GOALS: Target date: 10/28/2023  Improve patient specific functional score to at least 70% Baseline: 0% Goal status: Met 09/27/2023  2.  Lulia will report left shoulder pain consistently 0-3/10 on numeric pain rating scale Baseline: Left upper extremity was completely numb due to a nerve block at evaluation Goal status: Partially Met 09/29/2023  3.  Improve left shoulder active range of motion to 90% of flexion at 170;  IR at 60; ER to 70 and horizontal adduction to 40 degrees Baseline: See objective Goal status: INITIAL  4.  Improve left shoulder strength to at least 60% of the uninvolved right Baseline: Strength testing deferred at evaluation secondary to her procedure being less than 24 hours ago Goal status: INITIAL  5.  Lenore will be independent with her long-term home maintenance exercise program at discharge Baseline: Started 08/05/2023 Goal status: INITIAL  PLAN:  PT FREQUENCY: 2-3 times a week  PT DURATION: 12 weeks total (8 additional weeks)  PLANNED INTERVENTIONS: 97110-Therapeutic exercises, 97530- Therapeutic activity, 97112- Neuromuscular re-education, 97535- Self Care, [id number]- Manual therapy, 97016- Vasopneumatic device, Patient/Family education, Joint mobilization, Cryotherapy, and Moist heat  PLAN FOR NEXT SESSION: Capsular stretching, Active assistive and active range of motion activities.  Emphasis on improving active range of motion as her biggest issue pre-surgery was a severe adhesive capsulitis.  Scapular and rotator cuff strengthening as AROM and capsular flexibility improve. Keep working on IR/ER as this is still very stiff and limited.  Joli Neas PT, MPT 09/29/23 10:19 AM

## 2023-10-04 ENCOUNTER — Ambulatory Visit: Admitting: Physical Therapy

## 2023-10-04 ENCOUNTER — Encounter: Payer: Self-pay | Admitting: Physical Therapy

## 2023-10-04 DIAGNOSIS — M25512 Pain in left shoulder: Secondary | ICD-10-CM | POA: Diagnosis not present

## 2023-10-04 DIAGNOSIS — G8929 Other chronic pain: Secondary | ICD-10-CM

## 2023-10-04 DIAGNOSIS — M6281 Muscle weakness (generalized): Secondary | ICD-10-CM | POA: Diagnosis not present

## 2023-10-04 DIAGNOSIS — R600 Localized edema: Secondary | ICD-10-CM

## 2023-10-04 DIAGNOSIS — M25612 Stiffness of left shoulder, not elsewhere classified: Secondary | ICD-10-CM

## 2023-10-04 DIAGNOSIS — R293 Abnormal posture: Secondary | ICD-10-CM | POA: Diagnosis not present

## 2023-10-04 NOTE — Therapy (Signed)
 OUTPATIENT PHYSICAL THERAPY SHOULDER TREATMENT NOTE   Patient Name: [name] MRN: [id number] DOB:29-Jan-1976, 48 y.o., female Today's Date: 10/04/2023  END OF SESSION:  PT End of Session - 10/04/23 0924     Visit Number 12    Number of Visits 24    Date for PT Re-Evaluation 10/28/23    Authorization Type AET[employer]    PT Start Time 0930    PT Stop Time 1010    PT Time Calculation (min) 40 min    Activity Tolerance Patient tolerated treatment well;Patient limited by pain;No increased pain    Behavior During Therapy WFL for tasks assessed/performed             Past Medical History:  Diagnosis Date   DM type 2 (diabetes mellitus, type 2) (HCC) 07/13/2011   Gestational diabetes    With first pregnancy, glyburide    HTN complicating peripregnancy, antepartum, third trimester 10/11/2016   Obesity (BMI 30-39.9)    Pregnancy induced hypertension    Uterine scar from previous cesarean delivery affecting pregnancy 10/11/2016   Past Surgical History:  Procedure Laterality Date   BICEPT TENODESIS Left 08/04/2023   Procedure: LEFT OPEN BICEPS TENODESIS;  Surgeon: Addie Cordella Hamilton, MD;  Location: [facility];  Service: Orthopedics;  Laterality: Left;  OPEN BICEPS TENODESIS   CESAREAN SECTION N/A 04/10/2013   Procedure: CESAREAN SECTION;  Surgeon: Harland JAYSON Birkenhead, MD;  Location: [facility];  Service: Obstetrics;  Laterality: N/A;   CESAREAN SECTION N/A 10/11/2016   Procedure: CESAREAN SECTION;  Surgeon: Danielle Rom, MD;  Location: [facility];  Service: Obstetrics;  Laterality: N/A;  Powell POUR, RNFA   PERINEUM REPAIR N/A 04/10/2013   Procedure: EPISIOTOMY REPAIR;  Surgeon: Harland JAYSON Birkenhead, MD;  Location: [facility];  Service: Obstetrics;  Laterality: N/A;   POSTERIOR LUMBAR FUSION 2 WITH HARDWARE REMOVAL Left 08/04/2023   Procedure: LEFT SHOULDER ARTHROSCOPY WITH EXTENSIVE DEBRIDEMENT;  Surgeon: Addie Cordella Hamilton, MD;  Location: [facility] OR;  Service: Orthopedics;  Laterality: Left;  LEFT SHOULDER  MANIPULATION UNDER ANESTHESIA, EXTENSIVE DEBRIDEMENT   SHOULDER CLOSED REDUCTION Left 08/04/2023   Procedure: LEFT SHOULDER MANIPULATION WITH ANESTHESIA;  Surgeon: Addie Cordella Hamilton, MD;  Location: [facility] OR;  Service: Orthopedics;  Laterality: Left;   Patient Active Problem List   Diagnosis Date Noted   Biceps tendonitis on right 08/06/2023   Degenerative superior labral anterior-to-posterior (SLAP) tear of right shoulder 08/06/2023   Synovitis of right shoulder 08/06/2023   Adhesive capsulitis of right shoulder 08/06/2023   Adhesive capsulitis of left shoulder 03/01/2023   Dyspepsia 12/23/2021   Peeling skin 04/07/2021   Healthcare maintenance 12/22/2018   Hyperlipemia 08/26/2014   Microalbuminuria 08/26/2014   HTN (hypertension) 06/21/2013   Type 2 diabetes mellitus with microalbuminuria, without long-term current use of insulin  (HCC) 07/13/2011    PCP: Raguel Blush, MD  REFERRING PROVIDER: Cordella Hamilton Addie, MD  REFERRING DIAG: M75.02 (ICD-10-CM) - Adhesive capsulitis of left shoulde  THERAPY DIAG:  Abnormal posture  Muscle weakness (generalized)  Localized edema  Chronic left shoulder pain  Stiffness of left shoulder, not elsewhere classified  Rationale for Evaluation and Treatment: Rehabilitation  ONSET DATE: 08/04/2023 manipulation under anesthesia with arthroscopy, extensive debridement, rotator interval release and biceps tenodesis, history of 1.5 years  SUBJECTIVE:  SUBJECTIVE STATEMENT: No new complaints. Reports compliance to HEP  Hand dominance: Left  PERTINENT HISTORY: Type 2 DM  PAIN:  Are you having pain? Yes: NPRS scale: 3-5/10 usually but pain can be higher at end range rotation Pain location: Left shoulder Pain description: Ache, sore Aggravating factors: maybe  sleeping on it, heavier use Relieving factors: nothing   PRECAUTIONS: Other: Post-surgical/manipulation shoulder  WEIGHT BEARING RESTRICTIONS: No  FALLS:  Has patient fallen in last 6 months? No  LIVING ENVIRONMENT: Lives with: lives with their family, lives with their spouse, lives with their daughter, and and mother Lives in: House/apartment Stairs: No problem Has following equipment at home: None  OCCUPATION: Nail tech  PLOF: Independent  PATIENT GOALS:Return to normal function as quickly as possible  NEXT MD VISIT:   OBJECTIVE:  Note: Objective measures were completed at Evaluation unless otherwise noted.  DIAGNOSTIC FINDINGS:  Radiographs of her left shoulder demonstrate no acute fractures no  significant degenerative changes  PATIENT SURVEYS:  09/27/2023: Patient specific functional scale (0 unable to 10 no difficulty) 1) Reaching   8/10 2) Wash/style hair  10/10 3) Reach overhead 8/10 Total    26/30 or 8.67 Avg.  09/01/2023: Patient specific functional scale (0 unable to 10 no difficulty) 1) Reaching   4/10 2) Wash/style hair  3/10 3) Reach overhead 2/10 Total    3/30 or 10%   Evaluation: Patient specific functional scale (0 unable to 10 no difficulty) 1) Reaching   0/10 2) Wash/style hair  0/10 3) Reach overhead 0/10 Total    0/30 or 0%   POSTURE: Rounded shoulders and forward head in the sling  UPPER EXTREMITY ROM:   Passive ROM Left/Right 08/04/2023 Left 08/11/2023 AROM Left 08/30/23  Left/Right 09/01/2023 Left 09/16/23 supine  Left 09/23/2023 Left 09/27/2023 Left 09/29/2023  Shoulder flexion 150/170 140 A: 145 P: 150 140 P 140* A 150* 145 145 145  Shoulder extension          Shoulder abduction     P 100* A 150*     Shoulder horizontal adduction 35/40 35  40  45 45 45  Shoulder internal rotation 70/55 50 A at 90 deg abd: 50 P: 70 50 at 70 degrees abduction P 50* at 70 degrees ABD  50 degrees at 70 degrees abduction 50 degrees at 70 degrees  abduction 55 degrees at 70 degrees abduction  Shoulder external rotation 60/105 50 A at 90 deg abd:55 P: 60 45 at 70 degrees abduction P 30* at 70 degrees ABD  50 degrees at 70 degrees abduction 55 degrees at 70 degrees abduction 60 degrees at 70 degrees abduction  Elbow flexion          Elbow extension          Wrist flexion          Wrist extension          Wrist ulnar deviation          Wrist radial deviation          Wrist pronation          Wrist supination          (Blank rows = not tested)  UPPER EXTREMITY STRENGTH: Deferred due to surgery/manipulation yesterday evening  Assessed in pounds with hand-held dynamometer Left/Right 08/05/2023 Left/Right 09/01/2023  Shoulder flexion    Shoulder extension    Shoulder abduction    Shoulder adduction    Shoulder internal rotation  15.8/24.7  Shoulder external rotation  11.2/19.8  Middle trapezius    Lower trapezius    Elbow flexion    Elbow extension    Wrist flexion    Wrist extension    Wrist ulnar deviation    Wrist radial deviation    Wrist pronation    Wrist supination    Grip strength (lbs)    (Blank rows = not tested)                                                                                                                            TREATMENT DATE:  10/04/2023 Supine shoulder IR stretch (60, 70, 80 degrees abduction), elbow on pillows so it is slightly above shoulder height and reach towards pants pocket 10 x 10 seconds each S/L shoulder IR , slide hand behind back 2x10 Supine shoulder ER stretch (60, 70, 80 degrees abduction), elbow on pillows so it is slightly above shoulder height and reach back towards mat like reaching for a seatbelt 10 x 10 seconds each Thera-Band ER and IR Green 10 x each direction with slow eccentrics Supine shoulder flexion, reach up (protract shoulder), palm facing in 20 x 10 seconds for reaching and overhead function (with 1# dowel) S/L hands behind head, shoulder ER with pec stretch  2x10 S/L shoulder ER 2x10 S/L sleeper stretch 10x10 Manual: grade II to III joint mobs all directions with arm abd at ~90 deg  09/29/2023 Supine shoulder IR stretch (70 degrees abduction), elbow on pillows so it is slightly above shoulder height and reach towards pants pocket 20 x 10 seconds Supine shoulder ER stretch (70 degrees abduction), elbow on pillows so it is slightly above shoulder height and reach back towards mat like reaching for a seatbelt 20 x 10 seconds Thera-Band ER and IR Green 10 x each direction with slow eccentrics  Functional Activities: Supine shoulder flexion, reach up (protract shoulder), palm facing in 15 x 10 seconds for reaching and overhead function (with 1# dowel) Thumb up the back stretch for internal rotation/behind the back with strap (belt, backseat) function 15 x 10 seconds   09/27/2023 Supine shoulder IR stretch (70 degrees abduction), elbow on pillows so it is slightly above shoulder height and reach towards pants pocket 20 x 10 seconds Supine shoulder ER stretch (70 degrees abduction), elbow on pillows so it is slightly above shoulder height and reach back towards mat like reaching for a seatbelt 20 x 10 seconds Thera-Band ER and IR Green 10 x each direction with slow eccentrics  Functional Activities: Supine shoulder flexion, reach up (protract shoulder), palm facing in 20 x 10 seconds for reaching and overhead function (with 1# dowel) Thumb up the back stretch for internal rotation/behind the back with strap (belt, backseat) function 10 x 10 seconds   09/23/2023 Supine shoulder IR stretch (70 degrees abduction), elbow on pillows so it is slightly above shoulder height and reach towards pants pocket 15 x 10 seconds Supine shoulder ER stretch (70 degrees abduction), elbow  on pillows so it is slightly above shoulder height and reach back towards mat like reaching for a seatbelt 15 x 10 seconds Thera-Band ER and IR Green 10 x each direction with slow  eccentrics  Functional Activities: Supine shoulder flexion, reach up (protract shoulder), palm facing in 15 x 10 seconds for reaching and overhead function (5 x with right side overpressure, 5 x with 1# dowel, 5 x with 1# weight) Thumb up the back stretch for internal rotation/behind the back with strap (belt, backseat) function 15 x 10 seconds   PATIENT EDUCATION: Education details: See above Person educated: Patient and Spouse Education method: Explanation, Demonstration, Tactile cues, Verbal cues, and Handouts Education comprehension: verbalized understanding, returned demonstration, verbal cues required, tactile cues required, and needs further education  HOME EXERCISE PROGRAM: Access Code: [email or web address] URL: [email or web address] Date: 08/25/2023 Prepared by: Handsome Anglin April Earnie Starring  Exercises - Seated Shoulder Flexion AAROM with Pulley Behind  - 1 x daily - 7 x weekly - 1 sets - 10 reps - 10 seconds hold - Supine Scapular Protraction in Flexion with Dumbbells  - 2-3 x daily - 7 x weekly - 1 sets - 20 reps - 3 seconds hold - Supine Shoulder External Rotation Stretch  - 2-3 x daily - 7 x weekly - 1 sets - 10-20 reps - 10 seconds hold - Supine Shoulder Internal Rotation Stretch  - 2-3 x daily - 7 x weekly - 1 sets - 10-20 reps - 10 seconds hold - Supine Chest Stretch with Elbows Bent  - 1 x daily - 7 x weekly - 1 sets - 10 reps - Standing Shoulder External Rotation Stretch in Doorway  - 1 x daily - 7 x weekly - 1 sets - 10 reps - Shoulder External Rotation and Scapular Retraction with Resistance  - 1 x daily - 7 x weekly - 2 sets - 10 reps - Shoulder Flexion Wall Slide with Towel  - 1 x daily - 7 x weekly - 2 sets - 10 reps - Standing Shoulder Abduction Slides at Wall  - 1 x daily - 7 x weekly - 2 sets - 10 reps - Scaption Wall Slide with Towel  - 1 x daily - 7 x weekly - 2 sets - 10 reps  ASSESSMENT:  CLINICAL IMPRESSION: AROM and capsular flexibility remain the  emphasis. Continued AROM and strengthening.   EVAL: Patient is a 48 y.o. female who was seen today for physical therapy evaluation and treatment for s/p left shoulder manipulation under anesthesia with arthroscopy, extensive debridement, rotator interval release and mini-open biceps tenodesis.  Tristy had her procedure last night and she is still feeling the effects of the nerve block with her left upper extremity.  Early emphasis will be on range of motion to make sure she does not freeze back up regards to her range of motion.  Appropriate scapular and rotator cuff strengthening will be implemented as appropriate to facilitate meeting the below listed long-term goals.  OBJECTIVE IMPAIRMENTS: decreased activity tolerance, decreased endurance, decreased knowledge of condition, decreased ROM, decreased strength, increased edema, impaired perceived functional ability, impaired sensation, impaired UE functional use, and pain.   ACTIVITY LIMITATIONS: carrying, lifting, sleeping, dressing, and reach over head  PARTICIPATION LIMITATIONS: meal prep, cleaning, laundry, driving, shopping, community activity, and occupation  PERSONAL FACTORS: Type 2 DM is also affecting patient's functional outcome.   REHAB POTENTIAL: Good  CLINICAL DECISION MAKING: Stable/uncomplicated  EVALUATION COMPLEXITY: Low   GOALS: Goals reviewed with patient? Yes  SHORT TERM GOALS: Target date: 09/16/2023  Marla will be independent with her day 1 home exercise program Baseline: Started 08/05/2023 Goal status: Met 09/23/2023  2.  Improve left shoulder active range of motion for flexion to 150 degrees; IR to 70 degrees; ER to 70 degrees and horizontal adduction to 40 degrees Baseline: Passive range of motion 150; 70; 60 and 35 respectively Goal status: Partially Met 09/29/23   LONG TERM GOALS: Target date: 10/28/2023  Improve patient specific functional score to at least 70% Baseline: 0% Goal status: Met 09/27/2023  2.   Susette will report left shoulder pain consistently 0-3/10 on numeric pain rating scale Baseline: Left upper extremity was completely numb due to a nerve block at evaluation Goal status: Partially Met 09/29/2023  3.  Improve left shoulder active range of motion to 90% of flexion at 170; IR at 60; ER to 70 and horizontal adduction to 40 degrees Baseline: See objective Goal status: INITIAL  4.  Improve left shoulder strength to at least 60% of the uninvolved right Baseline: Strength testing deferred at evaluation secondary to her procedure being less than 24 hours ago Goal status: INITIAL  5.  Leeanna will be independent with her long-term home maintenance exercise program at discharge Baseline: Started 08/05/2023 Goal status: INITIAL  PLAN:  PT FREQUENCY: 2-3 times a week  PT DURATION: 12 weeks total (8 additional weeks)  PLANNED INTERVENTIONS: 97110-Therapeutic exercises, 97530- Therapeutic activity, 97112- Neuromuscular re-education, 97535- Self Care, [id number]- Manual therapy, [id number]- Vasopneumatic device, Patient/Family education, Joint mobilization, Cryotherapy, and Moist heat  PLAN FOR NEXT SESSION: Capsular stretching, Active assistive and active range of motion activities.  Emphasis on improving active range of motion as her biggest issue pre-surgery was a severe adhesive capsulitis.  Scapular and rotator cuff strengthening as AROM and capsular flexibility improve. Keep working on IR/ER as this is still very stiff and limited.  Shela Esses April Ma L Amarys Sliwinski, PT, DPT 10/04/23 9:24 AM

## 2023-10-06 ENCOUNTER — Ambulatory Visit: Admitting: Physical Therapy

## 2023-10-06 DIAGNOSIS — R600 Localized edema: Secondary | ICD-10-CM

## 2023-10-06 DIAGNOSIS — M6281 Muscle weakness (generalized): Secondary | ICD-10-CM | POA: Diagnosis not present

## 2023-10-06 DIAGNOSIS — R293 Abnormal posture: Secondary | ICD-10-CM | POA: Diagnosis not present

## 2023-10-06 DIAGNOSIS — G8929 Other chronic pain: Secondary | ICD-10-CM | POA: Diagnosis not present

## 2023-10-06 DIAGNOSIS — M25612 Stiffness of left shoulder, not elsewhere classified: Secondary | ICD-10-CM | POA: Diagnosis not present

## 2023-10-06 DIAGNOSIS — M25512 Pain in left shoulder: Secondary | ICD-10-CM

## 2023-10-06 NOTE — Therapy (Addendum)
 OUTPATIENT PHYSICAL THERAPY SHOULDER TREATMENT NOTE / DISCHARGE   Patient Name: [name] MRN: [id number] DOB:Dec 28, 1975, 48 y.o., female Today's Date: 10/06/2023  END OF SESSION:  PT End of Session - 10/06/23 0934     Visit Number 13    Number of Visits 24    Date for PT Re-Evaluation 10/28/23    Authorization Type AETNA    PT Start Time 0932    PT Stop Time 1015    PT Time Calculation (min) 43 min    Activity Tolerance Patient tolerated treatment well;Patient limited by pain;No increased pain    Behavior During Therapy WFL for tasks assessed/performed           Past Medical History:  Diagnosis Date   DM type 2 (diabetes mellitus, type 2) (HCC) 07/13/2011   Gestational diabetes    With first pregnancy, glyburide    HTN complicating peripregnancy, antepartum, third trimester 10/11/2016   Obesity (BMI 30-39.9)    Pregnancy induced hypertension    Uterine scar from previous cesarean delivery affecting pregnancy 10/11/2016   Past Surgical History:  Procedure Laterality Date   BICEPT TENODESIS Left 08/04/2023   Procedure: LEFT OPEN BICEPS TENODESIS;  Surgeon: Addie Cordella Hamilton, MD;  Location: [facility] OR;  Service: Orthopedics;  Laterality: Left;  OPEN BICEPS TENODESIS   CESAREAN SECTION N/A 04/10/2013   Procedure: CESAREAN SECTION;  Surgeon: Harland JAYSON Birkenhead, MD;  Location: [facility];  Service: Obstetrics;  Laterality: N/A;   CESAREAN SECTION N/A 10/11/2016   Procedure: CESAREAN SECTION;  Surgeon: Danielle Rom, MD;  Location: [facility];  Service: Obstetrics;  Laterality: N/A;  Powell POUR, RNFA   PERINEUM REPAIR N/A 04/10/2013   Procedure: EPISIOTOMY REPAIR;  Surgeon: Harland JAYSON Birkenhead, MD;  Location: [facility];  Service: Obstetrics;  Laterality: N/A;   POSTERIOR LUMBAR FUSION 2 WITH HARDWARE REMOVAL Left 08/04/2023   Procedure: LEFT SHOULDER ARTHROSCOPY WITH EXTENSIVE DEBRIDEMENT;  Surgeon: Addie Cordella Hamilton, MD;  Location: [facility] OR;  Service: Orthopedics;  Laterality: Left;  LEFT SHOULDER  MANIPULATION UNDER ANESTHESIA, EXTENSIVE DEBRIDEMENT   SHOULDER CLOSED REDUCTION Left 08/04/2023   Procedure: LEFT SHOULDER MANIPULATION WITH ANESTHESIA;  Surgeon: Addie Cordella Hamilton, MD;  Location: [facility] OR;  Service: Orthopedics;  Laterality: Left;   Patient Active Problem List   Diagnosis Date Noted   Biceps tendonitis on right 08/06/2023   Degenerative superior labral anterior-to-posterior (SLAP) tear of right shoulder 08/06/2023   Synovitis of right shoulder 08/06/2023   Adhesive capsulitis of right shoulder 08/06/2023   Adhesive capsulitis of left shoulder 03/01/2023   Dyspepsia 12/23/2021   Peeling skin 04/07/2021   Healthcare maintenance 12/22/2018   Hyperlipemia 08/26/2014   Microalbuminuria 08/26/2014   HTN (hypertension) 06/21/2013   Type 2 diabetes mellitus with microalbuminuria, without long-term current use of insulin  (HCC) 07/13/2011    PCP: Raguel Blush, MD  REFERRING PROVIDER: Cordella Hamilton Addie, MD  REFERRING DIAG: M75.02 (ICD-10-CM) - Adhesive capsulitis of left shoulde  THERAPY DIAG:  No diagnosis found.  Rationale for Evaluation and Treatment: Rehabilitation  ONSET DATE: 08/04/2023 manipulation under anesthesia with arthroscopy, extensive debridement, rotator interval release and biceps tenodesis, history of 1.5 years  SUBJECTIVE:  SUBJECTIVE STATEMENT: No new complaints.   Hand dominance: Left  PERTINENT HISTORY: Type 2 DM  PAIN:  Are you having pain? Yes: NPRS scale: 3-5/10 usually but pain can be higher at end range rotation Pain location: Left shoulder Pain description: Ache, sore Aggravating factors: maybe sleeping on it, heavier use Relieving factors: nothing   PRECAUTIONS: Other: Post-surgical/manipulation shoulder  WEIGHT BEARING RESTRICTIONS: No  FALLS:  Has  patient fallen in last 6 months? No  LIVING ENVIRONMENT: Lives with: lives with their family, lives with their spouse, lives with their daughter, and and mother Lives in: House/apartment Stairs: No problem Has following equipment at home: None  OCCUPATION: Nail tech  PLOF: Independent  PATIENT GOALS:Return to normal function as quickly as possible  NEXT MD VISIT:   OBJECTIVE:  Note: Objective measures were completed at Evaluation unless otherwise noted.  DIAGNOSTIC FINDINGS:  Radiographs of her left shoulder demonstrate no acute fractures no  significant degenerative changes  PATIENT SURVEYS:  09/27/2023: Patient specific functional scale (0 unable to 10 no difficulty) 1) Reaching   8/10 2) Wash/style hair  10/10 3) Reach overhead 8/10 Total    26/30 or 8.67 Avg.  09/01/2023: Patient specific functional scale (0 unable to 10 no difficulty) 1) Reaching   4/10 2) Wash/style hair  3/10 3) Reach overhead 2/10 Total    3/30 or 10%   Evaluation: Patient specific functional scale (0 unable to 10 no difficulty) 1) Reaching   0/10 2) Wash/style hair  0/10 3) Reach overhead 0/10 Total    0/30 or 0%   POSTURE: Rounded shoulders and forward head in the sling  UPPER EXTREMITY ROM:   Passive ROM Left/Right 08/04/2023 Left 08/11/2023 AROM Left 08/30/23 Left/Right 09/01/2023 Left 09/16/23 supine  Left 09/23/2023 Left 09/27/2023 Left 09/29/2023 Left 10/06/2023  Shoulder flexion 150/170 140 A: 145 P: 150 140 P 140* A 150* 145 145 145 155 AAROM sup  Shoulder extension           Shoulder abduction     P 100* A 150*      Shoulder horizontal adduction 35/40 35  40  45 45 45   Shoulder internal rotation 70/55 50 A at 90 deg abd: 50 P: 70 50 at 70 degrees abduction P 50* at 70 degrees ABD  50 degrees at 70 degrees abduction 50 degrees at 70 degrees abduction 55 degrees at 70 degrees abduction   Shoulder external rotation 60/105 50 A at 90 deg abd:55 P: 60 45 at 70 degrees abduction P 30*  at 70 degrees ABD  50 degrees at 70 degrees abduction 55 degrees at 70 degrees abduction 60 degrees at 70 degrees abduction   Elbow flexion           Elbow extension           Wrist flexion           Wrist extension           Wrist ulnar deviation           Wrist radial deviation           Wrist pronation           Wrist supination           (Blank rows = not tested)  UPPER EXTREMITY STRENGTH: Deferred due to surgery/manipulation yesterday evening  Assessed in pounds with hand-held dynamometer Left/Right 08/05/2023 Left/Right 09/01/2023  Shoulder flexion    Shoulder extension    Shoulder abduction    Shoulder  adduction    Shoulder internal rotation  15.8/24.7  Shoulder external rotation  11.2/19.8  Middle trapezius    Lower trapezius    Elbow flexion    Elbow extension    Wrist flexion    Wrist extension    Wrist ulnar deviation    Wrist radial deviation    Wrist pronation    Wrist supination    Grip strength (lbs)    (Blank rows = not tested)                                                                                                                            TREATMENT DATE:  10/06/2023 Pulleys flexion, abd, ER and horizontal abd x2 min each with MWM Supine shoulder ER stretch (70 degrees abduction), elbow on pillows so it is slightly above shoulder height and reach back towards mat like reaching for a seatbelt 20 x 10 seconds with PT manual assist Supine shoulder ER at 70 deg abd 2# 2x10x5 Supine shoulder IR stretch (70 degrees abduction), elbow on pillows so it is slightly above shoulder height and reach towards pants pocket 20 x 10 seconds with PT manual assist Supine shoulder IR at 70 deg abd 2# 2x10x5 Supine shoulder flexion with hands in pillowcase pushing into ER 2x10x5 Thumb up the back stretch for internal rotation/behind the back with pulley (belt, backseat) function 15 x 10 seconds  10/04/2023 Supine shoulder IR stretch (60, 70, 80 degrees abduction), elbow  on pillows so it is slightly above shoulder height and reach towards pants pocket 10 x 10 seconds each S/L shoulder IR , slide hand behind back 2x10 Supine shoulder ER stretch (60, 70, 80 degrees abduction), elbow on pillows so it is slightly above shoulder height and reach back towards mat like reaching for a seatbelt 10 x 10 seconds each Thera-Band ER and IR Green 10 x each direction with slow eccentrics Supine shoulder flexion, reach up (protract shoulder), palm facing in 20 x 10 seconds for reaching and overhead function (with 1# dowel) S/L hands behind head, shoulder ER with pec stretch 2x10 S/L shoulder ER 2x10 S/L sleeper stretch 10x10 Manual: grade II to III joint mobs all directions with arm abd at ~90 deg  09/29/2023 Supine shoulder IR stretch (70 degrees abduction), elbow on pillows so it is slightly above shoulder height and reach towards pants pocket 20 x 10 seconds Supine shoulder ER stretch (70 degrees abduction), elbow on pillows so it is slightly above shoulder height and reach back towards mat like reaching for a seatbelt 20 x 10 seconds Thera-Band ER and IR Green 10 x each direction with slow eccentrics  Functional Activities: Supine shoulder flexion, reach up (protract shoulder), palm facing in 15 x 10 seconds for reaching and overhead function (with 1# dowel) Thumb up the back stretch for internal rotation/behind the back with strap (belt, backseat) function 15 x 10 seconds   09/27/2023 Supine shoulder IR stretch (70 degrees abduction), elbow on  pillows so it is slightly above shoulder height and reach towards pants pocket 20 x 10 seconds Supine shoulder ER stretch (70 degrees abduction), elbow on pillows so it is slightly above shoulder height and reach back towards mat like reaching for a seatbelt 20 x 10 seconds Thera-Band ER and IR Green 10 x each direction with slow eccentrics  Functional Activities: Supine shoulder flexion, reach up (protract shoulder), palm facing  in 20 x 10 seconds for reaching and overhead function (with 1# dowel) Thumb up the back stretch for internal rotation/behind the back with strap (belt, backseat) function 10 x 10 seconds   09/23/2023 Supine shoulder IR stretch (70 degrees abduction), elbow on pillows so it is slightly above shoulder height and reach towards pants pocket 15 x 10 seconds Supine shoulder ER stretch (70 degrees abduction), elbow on pillows so it is slightly above shoulder height and reach back towards mat like reaching for a seatbelt 15 x 10 seconds Thera-Band ER and IR Green 10 x each direction with slow eccentrics  Functional Activities: Supine shoulder flexion, reach up (protract shoulder), palm facing in 15 x 10 seconds for reaching and overhead function (5 x with right side overpressure, 5 x with 1# dowel, 5 x with 1# weight) Thumb up the back stretch for internal rotation/behind the back with strap (belt, backseat) function 15 x 10 seconds   PATIENT EDUCATION: Education details: See above Person educated: Patient and Spouse Education method: Explanation, Demonstration, Tactile cues, Verbal cues, and Handouts Education comprehension: verbalized understanding, returned demonstration, verbal cues required, tactile cues required, and needs further education  HOME EXERCISE PROGRAM: Access Code: [email or web address] URL: [email or web address] Date: 08/25/2023 Prepared by: Jorden Minchey April Earnie Starring  Exercises - Seated Shoulder Flexion AAROM with Pulley Behind  - 1 x daily - 7 x weekly - 1 sets - 10 reps - 10 seconds hold - Supine Scapular Protraction in Flexion with Dumbbells  - 2-3 x daily - 7 x weekly - 1 sets - 20 reps - 3 seconds hold - Supine Shoulder External Rotation Stretch  - 2-3 x daily - 7 x weekly - 1 sets - 10-20 reps - 10 seconds hold - Supine Shoulder Internal Rotation Stretch  - 2-3 x daily - 7 x weekly - 1 sets - 10-20 reps - 10 seconds hold - Supine Chest Stretch with Elbows Bent  - 1 x  daily - 7 x weekly - 1 sets - 10 reps - Standing Shoulder External Rotation Stretch in Doorway  - 1 x daily - 7 x weekly - 1 sets - 10 reps - Shoulder External Rotation and Scapular Retraction with Resistance  - 1 x daily - 7 x weekly - 2 sets - 10 reps - Shoulder Flexion Wall Slide with Towel  - 1 x daily - 7 x weekly - 2 sets - 10 reps - Standing Shoulder Abduction Slides at Wall  - 1 x daily - 7 x weekly - 2 sets - 10 reps - Scaption Wall Slide with Towel  - 1 x daily - 7 x weekly - 2 sets - 10 reps  ASSESSMENT:  CLINICAL IMPRESSION: Flexion has improved by ~10 deg. AROM and capsular flexibility remain the emphasis. Continued AROM and strengthening.   EVAL: Patient is a 48 y.o. female who was seen today for physical therapy evaluation and treatment for s/p left shoulder manipulation under anesthesia with arthroscopy, extensive debridement, rotator interval release and mini-open biceps tenodesis.  Alayzha had her procedure last night and she is  still feeling the effects of the nerve block with her left upper extremity.  Early emphasis will be on range of motion to make sure she does not freeze back up regards to her range of motion.  Appropriate scapular and rotator cuff strengthening will be implemented as appropriate to facilitate meeting the below listed long-term goals.  OBJECTIVE IMPAIRMENTS: decreased activity tolerance, decreased endurance, decreased knowledge of condition, decreased ROM, decreased strength, increased edema, impaired perceived functional ability, impaired sensation, impaired UE functional use, and pain.   ACTIVITY LIMITATIONS: carrying, lifting, sleeping, dressing, and reach over head  PARTICIPATION LIMITATIONS: meal prep, cleaning, laundry, driving, shopping, community activity, and occupation  PERSONAL FACTORS: Type 2 DM is also affecting patient's functional outcome.   REHAB POTENTIAL: Good  CLINICAL DECISION MAKING: Stable/uncomplicated  EVALUATION COMPLEXITY:  Low   GOALS: Goals reviewed with patient? Yes  SHORT TERM GOALS: Target date: 09/16/2023  Najia will be independent with her day 1 home exercise program Baseline: Started 08/05/2023 Goal status: Met 09/23/2023  2.  Improve left shoulder active range of motion for flexion to 150 degrees; IR to 70 degrees; ER to 70 degrees and horizontal adduction to 40 degrees Baseline: Passive range of motion 150; 70; 60 and 35 respectively Goal status: Partially Met 09/29/23   LONG TERM GOALS: Target date: 10/28/2023  Improve patient specific functional score to at least 70% Baseline: 0% Goal status: Met 09/27/2023  2.  Katerin will report left shoulder pain consistently 0-3/10 on numeric pain rating scale Baseline: Left upper extremity was completely numb due to a nerve block at evaluation Goal status: Partially Met 09/29/2023  3.  Improve left shoulder active range of motion to 90% of flexion at 170; IR at 60; ER to 70 and horizontal adduction to 40 degrees Baseline: See objective Goal status: INITIAL  4.  Improve left shoulder strength to at least 60% of the uninvolved right Baseline: Strength testing deferred at evaluation secondary to her procedure being less than 24 hours ago Goal status: INITIAL  5.  Nicholette will be independent with her long-term home maintenance exercise program at discharge Baseline: Started 08/05/2023 Goal status: INITIAL  PLAN:  PT FREQUENCY: 2-3 times a week  PT DURATION: 12 weeks total (8 additional weeks)  PLANNED INTERVENTIONS: 97110-Therapeutic exercises, 97530- Therapeutic activity, 97112- Neuromuscular re-education, 97535- Self Care, [id number]- Manual therapy, 97016- Vasopneumatic device, Patient/Family education, Joint mobilization, Cryotherapy, and Moist heat  PLAN FOR NEXT SESSION: Capsular stretching, Active assistive and active range of motion activities.  Emphasis on improving active range of motion as her biggest issue pre-surgery was a severe adhesive capsulitis.   Scapular and rotator cuff strengthening as AROM and capsular flexibility improve. Keep working on IR/ER as this is still very stiff and limited.  Antony Sian April Ma L Tomika Eckles, PT, DPT 10/06/23 9:54 AM    PHYSICAL THERAPY DISCHARGE SUMMARY  Visits from Start of Care: 13  Current functional level related to goals / functional outcomes: See note   Remaining deficits: See note   Education / Equipment: HEP  Patient goals were partially met. Patient is being discharged due to not returning since the last visit.  Ozell Silvan, PT, DPT, OCS, ATC 12/26/23  3:03 PM

## 2023-10-10 ENCOUNTER — Encounter: Payer: Self-pay | Admitting: Surgical

## 2023-10-10 ENCOUNTER — Ambulatory Visit (INDEPENDENT_AMBULATORY_CARE_PROVIDER_SITE_OTHER): Admitting: Surgical

## 2023-10-10 DIAGNOSIS — M7502 Adhesive capsulitis of left shoulder: Secondary | ICD-10-CM

## 2023-10-10 NOTE — Progress Notes (Signed)
 Post-Op Visit Note   Patient: [name]           Date of Birth: 12-17-1975           MRN: [id number] Visit Date: 10/10/2023 PCP: Tanda Bleacher, MD   Assessment & Plan:  Chief Complaint:  Chief Complaint  Patient presents with   Left Shoulder - Follow-up    08/04/2023 left shoulder MUA, BT   Visit Diagnoses:  1. Adhesive capsulitis of left shoulder     Plan: Patient is a 48 year old female who presents to fleshly left shoulder manipulation under anesthesia with rotator interval release and bicep tenodesis on 08/04/2023.  Doing well.  He has some intermittent pain at the end of range of motion.  She has finished with physical therapy and transition to home exercise program.  Wants to return to work in full capacity.  Has some nighttime pain.  On exam, patient has incisions that are well-healed.  2+ radial pulse of the operative extremity.  Intact bicep flexion and supination strength with excellent strength and no evidence of Popeye deformity.  Excellent rotator cuff strength.  She has range of motion demonstrating 20 degrees external rotation, 80 degrees abduction, 135 degrees forward elevation passively.  Plan at this time is okay to start with lifting 5 pounds and slowly increase as she can tolerate.  She can go back to work.  Follow-up with the office as needed if she has any continued discomfort or new complaints.  Follow-Up Instructions: No follow-ups on file.   Orders:  No orders of the defined types were placed in this encounter.  No orders of the defined types were placed in this encounter.   Imaging: No results found.  PMFS History: Patient Active Problem List   Diagnosis Date Noted   Biceps tendonitis on right 08/06/2023   Degenerative superior labral anterior-to-posterior (SLAP) tear of right shoulder 08/06/2023   Synovitis of right shoulder 08/06/2023   Adhesive capsulitis of right shoulder 08/06/2023   Adhesive capsulitis of left shoulder 03/01/2023    Dyspepsia 12/23/2021   Peeling skin 04/07/2021   Healthcare maintenance 12/22/2018   Hyperlipemia 08/26/2014   Microalbuminuria 08/26/2014   HTN (hypertension) 06/21/2013   Type 2 diabetes mellitus with microalbuminuria, without long-term current use of insulin  (HCC) 07/13/2011   Past Medical History:  Diagnosis Date   DM type 2 (diabetes mellitus, type 2) (HCC) 07/13/2011   Gestational diabetes    With first pregnancy, glyburide    HTN complicating peripregnancy, antepartum, third trimester 10/11/2016   Obesity (BMI 30-39.9)    Pregnancy induced hypertension    Uterine scar from previous cesarean delivery affecting pregnancy 10/11/2016    Family History  Problem Relation Age of Onset   Cancer Father    Alcohol abuse Neg Hx    Arthritis Neg Hx    Asthma Neg Hx    Birth defects Neg Hx    COPD Neg Hx    Depression Neg Hx    Diabetes Neg Hx    Drug abuse Neg Hx    Early death Neg Hx    Hearing loss Neg Hx    Heart disease Neg Hx    Hyperlipidemia Neg Hx    Hypertension Neg Hx    Kidney disease Neg Hx    Learning disabilities Neg Hx    Mental illness Neg Hx    Mental retardation Neg Hx    Miscarriages / Stillbirths Neg Hx    Stroke Neg Hx    Vision loss Neg  Hx    Varicose Veins Neg Hx    Breast cancer Neg Hx     Past Surgical History:  Procedure Laterality Date   BICEPT TENODESIS Left 08/04/2023   Procedure: LEFT OPEN BICEPS TENODESIS;  Surgeon: Addie Cordella Hamilton, MD;  Location: [facility] OR;  Service: Orthopedics;  Laterality: Left;  OPEN BICEPS TENODESIS   CESAREAN SECTION N/A 04/10/2013   Procedure: CESAREAN SECTION;  Surgeon: Harland JAYSON Birkenhead, MD;  Location: [facility];  Service: Obstetrics;  Laterality: N/A;   CESAREAN SECTION N/A 10/11/2016   Procedure: CESAREAN SECTION;  Surgeon: Danielle Rom, MD;  Location: [facility];  Service: Obstetrics;  Laterality: N/A;  Powell POUR, RNFA   PERINEUM REPAIR N/A 04/10/2013   Procedure: EPISIOTOMY REPAIR;  Surgeon: Harland JAYSON Birkenhead, MD;   Location: [facility];  Service: Obstetrics;  Laterality: N/A;   POSTERIOR LUMBAR FUSION 2 WITH HARDWARE REMOVAL Left 08/04/2023   Procedure: LEFT SHOULDER ARTHROSCOPY WITH EXTENSIVE DEBRIDEMENT;  Surgeon: Addie Cordella Hamilton, MD;  Location: [facility] OR;  Service: Orthopedics;  Laterality: Left;  LEFT SHOULDER MANIPULATION UNDER ANESTHESIA, EXTENSIVE DEBRIDEMENT   SHOULDER CLOSED REDUCTION Left 08/04/2023   Procedure: LEFT SHOULDER MANIPULATION WITH ANESTHESIA;  Surgeon: Addie Cordella Hamilton, MD;  Location: [facility] OR;  Service: Orthopedics;  Laterality: Left;   Social History   Occupational History   Not on file  Tobacco Use   Smoking status: Never   Smokeless tobacco: Never  Vaping Use   Vaping status: Never Used  Substance and Sexual Activity   Alcohol use: No   Drug use: No   Sexual activity: Yes    Birth control/protection: None

## 2023-10-20 ENCOUNTER — Other Ambulatory Visit: Payer: Self-pay | Admitting: Family Medicine

## 2023-11-18 ENCOUNTER — Other Ambulatory Visit: Payer: Self-pay | Admitting: Family Medicine

## 2023-11-24 ENCOUNTER — Ambulatory Visit: Admitting: Family Medicine

## 2023-11-30 ENCOUNTER — Encounter: Payer: Self-pay | Admitting: Family Medicine

## 2023-11-30 ENCOUNTER — Ambulatory Visit (INDEPENDENT_AMBULATORY_CARE_PROVIDER_SITE_OTHER): Admitting: Family Medicine

## 2023-11-30 VITALS — BP 122/82 | HR 82 | Ht 61.0 in | Wt 142.6 lb

## 2023-11-30 DIAGNOSIS — Z794 Long term (current) use of insulin: Secondary | ICD-10-CM

## 2023-11-30 DIAGNOSIS — E782 Mixed hyperlipidemia: Secondary | ICD-10-CM | POA: Diagnosis not present

## 2023-11-30 DIAGNOSIS — E119 Type 2 diabetes mellitus without complications: Secondary | ICD-10-CM | POA: Diagnosis not present

## 2023-11-30 DIAGNOSIS — Z7985 Long-term (current) use of injectable non-insulin antidiabetic drugs: Secondary | ICD-10-CM

## 2023-11-30 DIAGNOSIS — Z7984 Long term (current) use of oral hypoglycemic drugs: Secondary | ICD-10-CM

## 2023-11-30 DIAGNOSIS — I10 Essential (primary) hypertension: Secondary | ICD-10-CM

## 2023-11-30 LAB — POCT GLYCOSYLATED HEMOGLOBIN (HGB A1C): HbA1c, POC (controlled diabetic range): 8.8 % — AB (ref 0.0–7.0)

## 2023-11-30 MED ORDER — EMPAGLIFLOZIN 10 MG PO TABS: 10.0000 mg | ORAL_TABLET | Freq: Every day | ORAL | 0 refills | Status: DC

## 2023-11-30 MED ORDER — BASAGLAR KWIKPEN 100 UNIT/ML ~~LOC~~ SOPN: 22.0000 [IU] | PEN_INJECTOR | Freq: Every day | SUBCUTANEOUS | 2 refills | Status: DC

## 2023-11-30 MED ORDER — ENALAPRIL MALEATE 2.5 MG PO TABS: ORAL_TABLET | ORAL | 3 refills | Status: DC

## 2023-11-30 NOTE — Progress Notes (Signed)
 Established Patient Office Visit  Subjective    Patient ID: [name], female    DOB: 1976/02/24  Age: 48 y.o. MRN: [id number]  CC:  Chief Complaint  Patient presents with   Medical Management of Chronic Issues    HPI [name] presents for routine annual exam. Patient denies acute complaints.   Outpatient Encounter Medications as of 11/30/2023  Medication Sig   atorvastatin  (LIPITOR) 40 MG tablet Take 1 tablet (40 mg total) by mouth daily.   blood glucose meter kit and supplies KIT Dispense based on patient and insurance preference. Use up to four times daily as directed.   celecoxib  (CELEBREX ) 100 MG capsule TAKE 1 CAPSULE BY MOUTH TWICE A DAY   Continuous Glucose Sensor (FREESTYLE LIBRE 3 PLUS SENSOR) MISC CHANGE SENSOR EVERY 15 DAYS.   insulin  aspart (NOVOLOG  FLEXPEN) 100 UNIT/ML FlexPen Inject 8 Units into the skin daily before supper.   insulin  lispro (HUMALOG  KWIKPEN) 200 UNIT/ML KwikPen Inject 8 Units into the skin at bedtime.   Insulin  Pen Needle (BD PEN NEEDLE NANO 2ND GEN) 32G X 4 MM MISC as directed   liraglutide  (VICTOZA ) 18 MG/3ML SOPN INJECT 1.8 MG UNDER THE SKIN ONCE DAILY   metFORMIN  (GLUCOPHAGE -XR) 500 MG 24 hr tablet Take 2 tablets (1,000 mg total) by mouth 2 (two) times daily with a meal.   [DISCONTINUED] empagliflozin  (JARDIANCE ) 10 MG TABS tablet TAKE 1 TABLET BY MOUTH DAILY BEFORE BREAKFAST.   [DISCONTINUED] enalapril  (VASOTEC ) 2.5 MG tablet TAKE 1 TABLET(2.5 MG) BY MOUTH DAILY   [DISCONTINUED] Insulin  Glargine (BASAGLAR  KWIKPEN) 100 UNIT/ML Inject 22 Units into the skin daily.   empagliflozin  (JARDIANCE ) 10 MG TABS tablet Take 1 tablet (10 mg total) by mouth daily before breakfast.   enalapril  (VASOTEC ) 2.5 MG tablet TAKE 1 TABLET(2.5 MG) BY MOUTH DAILY   Insulin  Glargine (BASAGLAR  KWIKPEN) 100 UNIT/ML Inject 22 Units into the skin daily.   methocarbamol  (ROBAXIN ) 500 MG tablet Take 1 tablet (500 mg total) by mouth every 8 (eight) hours as needed for muscle  spasms. (Patient not taking: Reported on 11/30/2023)   oxyCODONE  (ROXICODONE ) 5 MG immediate release tablet Take 1 tablet (5 mg total) by mouth every 4 (four) hours as needed for severe pain (pain score 7-10). (Patient not taking: Reported on 11/30/2023)   No facility-administered encounter medications on file as of 11/30/2023.    Past Medical History:  Diagnosis Date   DM type 2 (diabetes mellitus, type 2) (HCC) 07/13/2011   Gestational diabetes    With first pregnancy, glyburide    HTN complicating peripregnancy, antepartum, third trimester 10/11/2016   Obesity (BMI 30-39.9)    Pregnancy induced hypertension    Uterine scar from previous cesarean delivery affecting pregnancy 10/11/2016    Past Surgical History:  Procedure Laterality Date   BICEPT TENODESIS Left 08/04/2023   Procedure: LEFT OPEN BICEPS TENODESIS;  Surgeon: Addie Cordella Hamilton, MD;  Location: [facility] OR;  Service: Orthopedics;  Laterality: Left;  OPEN BICEPS TENODESIS   CESAREAN SECTION N/A 04/10/2013   Procedure: CESAREAN SECTION;  Surgeon: Harland JAYSON Birkenhead, MD;  Location: [facility] ORS;  Service: Obstetrics;  Laterality: N/A;   CESAREAN SECTION N/A 10/11/2016   Procedure: CESAREAN SECTION;  Surgeon: Danielle Rom, MD;  Location: [facility];  Service: Obstetrics;  Laterality: N/A;  Powell POUR, RNFA   PERINEUM REPAIR N/A 04/10/2013   Procedure: EPISIOTOMY REPAIR;  Surgeon: Harland JAYSON Birkenhead, MD;  Location: [facility] ORS;  Service: Obstetrics;  Laterality: N/A;   POSTERIOR LUMBAR FUSION  2 WITH HARDWARE REMOVAL Left 08/04/2023   Procedure: LEFT SHOULDER ARTHROSCOPY WITH EXTENSIVE DEBRIDEMENT;  Surgeon: Addie Cordella Hamilton, MD;  Location: [facility] OR;  Service: Orthopedics;  Laterality: Left;  LEFT SHOULDER MANIPULATION UNDER ANESTHESIA, EXTENSIVE DEBRIDEMENT   SHOULDER CLOSED REDUCTION Left 08/04/2023   Procedure: LEFT SHOULDER MANIPULATION WITH ANESTHESIA;  Surgeon: Addie Cordella Hamilton, MD;  Location: [facility] OR;  Service: Orthopedics;  Laterality: Left;     Family History  Problem Relation Age of Onset   Cancer Father    Alcohol abuse Neg Hx    Arthritis Neg Hx    Asthma Neg Hx    Birth defects Neg Hx    COPD Neg Hx    Depression Neg Hx    Diabetes Neg Hx    Drug abuse Neg Hx    Early death Neg Hx    Hearing loss Neg Hx    Heart disease Neg Hx    Hyperlipidemia Neg Hx    Hypertension Neg Hx    Kidney disease Neg Hx    Learning disabilities Neg Hx    Mental illness Neg Hx    Mental retardation Neg Hx    Miscarriages / Stillbirths Neg Hx    Stroke Neg Hx    Vision loss Neg Hx    Varicose Veins Neg Hx    Breast cancer Neg Hx     Social History   Socioeconomic History   Marital status: Married    Spouse name: Not on file   Number of children: Not on file   Years of education: Not on file   Highest education level: Not on file  Occupational History   Not on file  Tobacco Use   Smoking status: Never   Smokeless tobacco: Never  Vaping Use   Vaping status: Never Used  Substance and Sexual Activity   Alcohol use: No   Drug use: No   Sexual activity: Yes    Birth control/protection: None  Other Topics Concern   Not on file  Social History Narrative   Not on file   Social Drivers of Health   Financial Resource Strain: Low Risk  (02/23/2023)   Overall Financial Resource Strain (CARDIA)    Difficulty of Paying Living Expenses: Not hard at all  Food Insecurity: No Food Insecurity (02/23/2023)   Hunger Vital Sign    Worried About Running Out of Food in the Last Year: Never true    Ran Out of Food in the Last Year: Never true  Transportation Needs: Not on file  Physical Activity: Sufficiently Active (02/23/2023)   Exercise Vital Sign    Days of Exercise per Week: 5 days    Minutes of Exercise per Session: 30 min  Stress: No Stress Concern Present (02/23/2023)   [employer] of Occupational Health - Occupational Stress Questionnaire    Feeling of Stress : Not at all  Social Connections: Moderately Isolated  (02/23/2023)   Social Connection and Isolation Panel    Frequency of Communication with Friends and Family: More than three times a week    Frequency of Social Gatherings with Friends and Family: More than three times a week    Attends Religious Services: Never    Database administrator or Organizations: No    Attends Banker Meetings: Never    Marital Status: Married  Catering manager Violence: Not At Risk (02/23/2023)   Humiliation, Afraid, Rape, and Kick questionnaire    Fear of Current or Ex-Partner: No  Emotionally Abused: No    Physically Abused: No    Sexually Abused: No    Review of Systems  All other systems reviewed and are negative.       Objective    BP 122/82   Pulse 82   Ht 5' 1 (1.549 m)   Wt 142 lb 9.6 oz (64.7 kg)   LMP 10/30/2023   SpO2 99%   BMI 26.94 kg/m   Physical Exam Vitals and nursing note reviewed.  Constitutional:      General: She is not in acute distress. Cardiovascular:     Rate and Rhythm: Normal rate and regular rhythm.  Pulmonary:     Effort: Pulmonary effort is normal.     Breath sounds: Normal breath sounds.  Abdominal:     Palpations: Abdomen is soft.     Tenderness: There is no abdominal tenderness.  Neurological:     General: No focal deficit present.     Mental Status: She is alert and oriented to person, place, and time.         Assessment & Plan:   Type 2 diabetes mellitus without complication, with long-term current use of insulin  (HCC) -     POCT glycosylated hemoglobin (Hb A1C)  Diabetes mellitus treated with oral medication (HCC)  Long-term current use of injectable noninsulin antidiabetic medication  Primary hypertension  Mixed hyperlipidemia  Other orders -     Enalapril  Maleate; TAKE 1 TABLET(2.5 MG) BY MOUTH DAILY  Dispense: 90 tablet; Refill: 3 -     Empagliflozin ; Take 1 tablet (10 mg total) by mouth daily before breakfast.  Dispense: 30 tablet; Refill: 0 -     Basaglar  KwikPen;  Inject 22 Units into the skin daily.  Dispense: 15 mL; Refill: 2     Return in about 3 months (around 03/01/2024) for follow up.   Tanda Raguel SQUIBB, MD

## 2023-12-01 ENCOUNTER — Telehealth: Payer: Self-pay | Admitting: Pharmacist

## 2023-12-01 ENCOUNTER — Other Ambulatory Visit: Payer: Self-pay

## 2023-12-01 ENCOUNTER — Other Ambulatory Visit: Payer: Self-pay | Admitting: Family Medicine

## 2023-12-01 ENCOUNTER — Ambulatory Visit: Attending: Family Medicine | Admitting: Pharmacist

## 2023-12-01 DIAGNOSIS — E1129 Type 2 diabetes mellitus with other diabetic kidney complication: Secondary | ICD-10-CM

## 2023-12-01 DIAGNOSIS — Z7985 Long-term (current) use of injectable non-insulin antidiabetic drugs: Secondary | ICD-10-CM

## 2023-12-01 DIAGNOSIS — Z794 Long term (current) use of insulin: Secondary | ICD-10-CM

## 2023-12-01 DIAGNOSIS — R809 Proteinuria, unspecified: Secondary | ICD-10-CM | POA: Diagnosis not present

## 2023-12-01 DIAGNOSIS — Z7984 Long term (current) use of oral hypoglycemic drugs: Secondary | ICD-10-CM | POA: Diagnosis not present

## 2023-12-01 MED ORDER — TRESIBA FLEXTOUCH 100 UNIT/ML ~~LOC~~ SOPN: 22.0000 [IU] | PEN_INJECTOR | Freq: Every day | SUBCUTANEOUS | 1 refills | Status: DC

## 2023-12-01 MED ORDER — OZEMPIC (0.25 OR 0.5 MG/DOSE) 2 MG/3ML ~~LOC~~ SOPN
0.5000 mg | PEN_INJECTOR | SUBCUTANEOUS | 1 refills | Status: DC
Start: 2023-12-01 — End: 2024-01-12

## 2023-12-01 NOTE — Telephone Encounter (Signed)
 Hey friend. Patient has tried and failed Trulicity  d/t injection side pain. She has now tried and failed Victoza . She has been on this now for several months and A1c remains above goal. Can we submit a PA for Ozempic ?

## 2023-12-01 NOTE — Progress Notes (Signed)
 S:     No chief complaint on file.  48 y.o. female who presents for diabetes evaluation, education, and management. Patient arrives in  good spirits and presents without any assistance.   Patient was referred and last seen by Primary Care Provider, Dr. Tanda, on 11/30/2023.    PMH is significant for T2DM with microalbuminuria, HTN, HLD. Of note, she has no known or documented hx of clinical ASCVD, CHF, or CKD. No hx of pancreatitis or thyroid cancer. DM is longstanding and she was previously managed by [facility] before transferring care to [facility] with Dr. Tanda.   I saw her last in Feb of this year. We stopped Trulicity . Insurance would not cover Ozempic  without trial and failure of both Trulicity  and Victoza . Therefore, we started her on Victoza .   Today, she endorses adherence to Victoza , Humalog , Jardiance , and metformin . She has not been taking Basaglar . Her pharmacy is currently out of stock. It looks like her insurance will cover Tresiba . She amenable to changing to this today. She is asymptomatic at this time and denies any hypoglycemia.    Family/Social History:  -Fhx: no pertinent positives -Never smoker -No alcohol intake reported   Current diabetes medications include: Basaglar  22u in the morning (not taking) Humalog  8u before she eats supper, Farxiga  10 mg daily, metformin  1000 mg XR daily (takes two 500 mg tablets BID), Victoza  1.8 mg daily  Patient reports adherence to taking all medications as prescribed with the exception of Basaglar .   Insurance coverage: Aetna  Patient denies hypoglycemic events.  Reported home fasting blood sugars: 120s-130s Reported 2 hour post-meal/random blood sugars: 200s  Patient denies nocturia (nighttime urination).  Patient denies neuropathy (nerve pain). Patient denies visual changes. Patient reports self foot exams.   Patient reported dietary habits:  -Admits to dietary indiscretion and names rice and bread as starchy foods  she eats daily  Patient-reported exercise habits: none  O:  No GM or CGM in place.   Lab Results  Component Value Date   HGBA1C 8.8 (A) 11/30/2023   There were no vitals filed for this visit.  Lipid Panel     Component Value Date/Time   CHOL 103 06/30/2022 1212   TRIG 94 06/30/2022 1212   HDL 49 06/30/2022 1212   CHOLHDL 2.1 06/30/2022 1212   CHOLHDL 4.7 07/11/2015 0906   VLDL 44 (H) 07/11/2015 0906   LDLCALC 36 06/30/2022 1212    Clinical Atherosclerotic Cardiovascular Disease (ASCVD): No  The ASCVD Risk score (Arnett DK, et al., 2019) failed to calculate for the following reasons:   The valid total cholesterol range is 130 to 320 mg/dL   Patient is participating in a Managed Medicaid Plan: No   A/P: Diabetes longstanding currently uncontrolled. Patient is able to verbalize appropriate hypoglycemia management plan. She is asymptomatic from a hypo- or hyperglycemia standpoint. Medication adherence appears to be okay, however, she is currently without Basaglar . We will change to Tresiba  instead. I will work with Burnard in pharmacy to see if we can get PA approval for Ozempic .  -Stop Basaglar  d/t avaiability issues.  -Start Tresiba  22 units daily.  -Continued Humalog  8 units before dinner.  -Continued Jardiance  10 mg daily.  -Stop Victoza . Start Ozempic  0.5 mg weekly. We will pursue PA approval for this.  -Extensively discussed pathophysiology of diabetes, recommended lifestyle interventions, dietary effects on blood sugar control.  -Counseled on s/sx of and management of hypoglycemia.  -Next A1c anticipated 02/2024.   Written patient instructions  provided. Patient verbalized understanding of treatment plan.  Total time in face to face counseling 30 minutes.    Follow-up:  Pharmacist in 1 month  Herlene Fleeta Morris, PharmD, [place], CPP Clinical Pharmacist [facility] [phone]

## 2023-12-02 ENCOUNTER — Other Ambulatory Visit: Payer: Self-pay

## 2023-12-05 ENCOUNTER — Other Ambulatory Visit: Payer: Self-pay

## 2023-12-08 ENCOUNTER — Telehealth: Payer: Self-pay

## 2023-12-08 ENCOUNTER — Other Ambulatory Visit: Payer: Self-pay

## 2023-12-08 NOTE — Telephone Encounter (Signed)
 FAXED EXPEDITED APPEAL FOR OZEMPIC  PRIOR AUTHORIZATION TO [employer] EXCHANGE TO [id number] TODAY.

## 2023-12-09 ENCOUNTER — Other Ambulatory Visit: Payer: Self-pay

## 2023-12-13 ENCOUNTER — Other Ambulatory Visit: Payer: Self-pay

## 2023-12-14 ENCOUNTER — Other Ambulatory Visit: Payer: Self-pay

## 2024-01-02 ENCOUNTER — Other Ambulatory Visit: Payer: Self-pay

## 2024-01-11 ENCOUNTER — Telehealth: Payer: Self-pay | Admitting: Family Medicine

## 2024-01-11 NOTE — Telephone Encounter (Signed)
 Confirmed appt for 10/1

## 2024-01-12 ENCOUNTER — Telehealth: Payer: Self-pay | Admitting: Pharmacist

## 2024-01-12 ENCOUNTER — Encounter: Payer: Self-pay | Admitting: Pharmacist

## 2024-01-12 ENCOUNTER — Other Ambulatory Visit: Payer: Self-pay | Admitting: Family Medicine

## 2024-01-12 ENCOUNTER — Ambulatory Visit: Attending: Family Medicine | Admitting: Pharmacist

## 2024-01-12 ENCOUNTER — Other Ambulatory Visit: Payer: Self-pay

## 2024-01-12 DIAGNOSIS — Z794 Long term (current) use of insulin: Secondary | ICD-10-CM

## 2024-01-12 DIAGNOSIS — Z7984 Long term (current) use of oral hypoglycemic drugs: Secondary | ICD-10-CM

## 2024-01-12 DIAGNOSIS — Z7985 Long-term (current) use of injectable non-insulin antidiabetic drugs: Secondary | ICD-10-CM | POA: Diagnosis not present

## 2024-01-12 DIAGNOSIS — E1129 Type 2 diabetes mellitus with other diabetic kidney complication: Secondary | ICD-10-CM | POA: Diagnosis not present

## 2024-01-12 DIAGNOSIS — R809 Proteinuria, unspecified: Secondary | ICD-10-CM | POA: Diagnosis not present

## 2024-01-12 MED ORDER — LIRAGLUTIDE 18 MG/3ML ~~LOC~~ SOPN: 1.8000 mg | PEN_INJECTOR | Freq: Every day | SUBCUTANEOUS | 6 refills | Status: DC

## 2024-01-12 MED ORDER — BASAGLAR KWIKPEN 100 UNIT/ML ~~LOC~~ SOPN: 22.0000 [IU] | PEN_INJECTOR | Freq: Every day | SUBCUTANEOUS | 1 refills | Status: DC

## 2024-01-12 MED ORDER — BD PEN NEEDLE NANO 2ND GEN 32G X 4 MM MISC: 3 refills | Status: DC

## 2024-01-12 NOTE — Telephone Encounter (Signed)
 Hey friend. Can we attempt a PA for this patient's Ozempic . [facility] today and the [facility]empic  is still denying.

## 2024-01-12 NOTE — Progress Notes (Signed)
 S:     No chief complaint on file.  48 y.o. female who presents for diabetes evaluation, education, and management. Patient arrives in  good spirits and presents without any assistance.   Patient was referred and last seen by Primary Care Provider, Dr. Tanda, on 11/30/2023.  I last saw her on 12/01/23. At that visit, we had her restart her basal insulin  and changed Trulicity  to Ozempic .  PMH is significant for T2DM with microalbuminuria, HTN, HLD. Of note, she has no known or documented hx of clinical ASCVD, CHF, or CKD. No hx of pancreatitis or thyroid cancer. DM is longstanding and she was previously managed by [facility] before transferring care to [facility] with Dr. Tanda.   I saw her last on 12/01/2023. We changed Trulicity  to Ozempic . There were also issues with her insulin  availability. We changed her Basaglar  to Tresiba .   Today, she endorses adherence to Basaglar , Victoza , Humalog , Jardiance , and metformin . She was able to get the Basaglar  shortly after our visit in August. Additionally, she tried to get Ozempic  from her pharmacy, however, they were unable to get insurance coverage for it. H  Family/Social History:  -Fhx: no pertinent positives -Never smoker -No alcohol intake reported   Current diabetes medications include: Basaglar  22u in the morning, Humalog  8u before she eats supper, Jardiance  10 mg daily, metformin  1000 mg XR daily (takes two 500 mg tablets BID), Victoza  1.8 mg daily  Patient reports adherence to taking all medications as prescribed with the exception of Basaglar .   Insurance coverage: Aetna  Patient denies hypoglycemic events.  Reported home fasting blood sugars: low 100s-120s Reported 2 hour post-meal/random blood sugars: none  Patient denies nocturia (nighttime urination).  Patient denies neuropathy (nerve pain). Patient denies visual changes. Patient reports self foot exams.   Patient reported dietary habits:  -Admits to dietary indiscretion  and names rice and bread as starchy foods she eats daily  Patient-reported exercise habits:  -Walks 30 minutes daily   O:  Lab Results  Component Value Date   HGBA1C 8.8 (A) 11/30/2023   There were no vitals filed for this visit.  Lipid Panel     Component Value Date/Time   CHOL 103 06/30/2022 1212   TRIG 94 06/30/2022 1212   HDL 49 06/30/2022 1212   CHOLHDL 2.1 06/30/2022 1212   CHOLHDL 4.7 07/11/2015 0906   VLDL 44 (H) 07/11/2015 0906   LDLCALC 36 06/30/2022 1212    Clinical Atherosclerotic Cardiovascular Disease (ASCVD): No  The ASCVD Risk score (Arnett DK, et al., 2019) failed to calculate for the following reasons:   The valid total cholesterol range is 130 to 320 mg/dL   Patient is participating in a Managed Medicaid Plan: No   A/P: Diabetes longstanding currently uncontrolled. Patient is able to verbalize appropriate hypoglycemia management plan. She is asymptomatic from a hypo- or hyperglycemia standpoint. Medication adherence appears to be okay. We will send in rxn refills for Victoza  and Basaglar . I called her pharmacy and they were unable to get a paid claim for Ozempic . Additionally, whatever supply issues with Basaglar  that were happening in August appear to be resolved. We will keep her on her current regimen for now. We will change to Tresiba  instead. I will work with Burnard in pharmacy to see if we can get PA approval for Ozempic .  -Continued Basaglar  22 units daily.  -Continued Humalog  8 units before dinner.  -Continued Jardiance  10 mg daily.  -Continued Victoza  1.8 mg daily. -Extensively discussed  pathophysiology of diabetes, recommended lifestyle interventions, dietary effects on blood sugar control.  -Counseled on s/sx of and management of hypoglycemia.  -Next A1c anticipated 02/2024.   Written patient instructions provided. Patient verbalized understanding of treatment plan.  Total time in face to face counseling 30 minutes.    Follow-up:  Pharmacist in  2 months  Herlene Fleeta Morris, PharmD, [place], CPP Clinical Pharmacist [facility] [phone]

## 2024-01-12 NOTE — Addendum Note (Signed)
 Addended by: FLEETA MORRIS, GARNETTE L on: 01/12/2024 03:01 PM   Modules accepted: Orders

## 2024-01-13 ENCOUNTER — Other Ambulatory Visit: Payer: Self-pay

## 2024-01-20 ENCOUNTER — Other Ambulatory Visit: Payer: Self-pay

## 2024-01-20 ENCOUNTER — Telehealth: Payer: Self-pay

## 2024-01-20 ENCOUNTER — Other Ambulatory Visit: Payer: Self-pay | Admitting: Pharmacist

## 2024-01-20 MED ORDER — OZEMPIC (0.25 OR 0.5 MG/DOSE) 2 MG/3ML ~~LOC~~ SOPN: 0.5000 mg | PEN_INJECTOR | SUBCUTANEOUS | 1 refills | Status: DC

## 2024-01-20 NOTE — Telephone Encounter (Signed)
 Pharmacy Patient Advocate Encounter  Received notification from [facility] that Prior Authorization for OZEMPIC  has been APPROVED from 12/28/2023 to 12/27/2024   PA #/Case ID/Reference #: [id number]

## 2024-01-30 ENCOUNTER — Ambulatory Visit
Admission: RE | Admit: 2024-01-30 | Discharge: 2024-01-30 | Disposition: A | Discharge status: Home / Self Care | Payer: Commercial Managed Care - HMO | Source: Ambulatory Visit | Attending: Family Medicine | Admitting: Family Medicine

## 2024-01-30 ENCOUNTER — Other Ambulatory Visit: Payer: Self-pay | Admitting: Family Medicine

## 2024-01-30 DIAGNOSIS — Z1231 Encounter for screening mammogram for malignant neoplasm of breast: Secondary | ICD-10-CM

## 2024-02-03 ENCOUNTER — Ambulatory Visit: Payer: Self-pay | Admitting: Family Medicine

## 2024-02-13 ENCOUNTER — Encounter: Payer: Self-pay | Admitting: Radiology

## 2024-02-20 NOTE — Progress Notes (Signed)
 [name]                                          MRN: [id number]   02/20/2024   The [facility] Quality Team Specialist reviewed this patient medical record for the purposes of chart review for care gap closure. The following were reviewed: chart review for care gap closure-glycemic status assessment.    VBCI Quality Team

## 2024-02-28 ENCOUNTER — Other Ambulatory Visit: Payer: Self-pay | Admitting: Family Medicine

## 2024-02-28 DIAGNOSIS — E119 Type 2 diabetes mellitus without complications: Secondary | ICD-10-CM

## 2024-03-01 ENCOUNTER — Ambulatory Visit (INDEPENDENT_AMBULATORY_CARE_PROVIDER_SITE_OTHER): Admitting: Family Medicine

## 2024-03-01 VITALS — BP 134/82 | HR 89 | Ht 61.0 in | Wt 145.0 lb

## 2024-03-01 DIAGNOSIS — Z23 Encounter for immunization: Secondary | ICD-10-CM

## 2024-03-01 DIAGNOSIS — E119 Type 2 diabetes mellitus without complications: Secondary | ICD-10-CM

## 2024-03-01 DIAGNOSIS — Z794 Long term (current) use of insulin: Secondary | ICD-10-CM | POA: Diagnosis not present

## 2024-03-01 LAB — POCT GLYCOSYLATED HEMOGLOBIN (HGB A1C): HbA1c, POC (controlled diabetic range): 8 % — AB (ref 0.0–7.0)

## 2024-03-01 MED ORDER — BASAGLAR KWIKPEN 100 UNIT/ML ~~LOC~~ SOPN: 22.0000 [IU] | PEN_INJECTOR | Freq: Every day | SUBCUTANEOUS | 1 refills | Status: DC

## 2024-03-01 MED ORDER — OZEMPIC (0.25 OR 0.5 MG/DOSE) 2 MG/3ML ~~LOC~~ SOPN: 0.5000 mg | PEN_INJECTOR | SUBCUTANEOUS | 1 refills | Status: DC

## 2024-03-05 ENCOUNTER — Encounter: Payer: Self-pay | Admitting: Family Medicine

## 2024-03-05 NOTE — Progress Notes (Signed)
 Established Patient Office Visit  Subjective    Patient ID: [name], female    DOB: March 18, 1976  Age: 48 y.o. MRN: [id number]  CC:  Chief Complaint  Patient presents with   Medical Management of Chronic Issues    Pt would like flu shot  Pt reports she has been out of insulin  basaglar  for 10 days. She would like to discuss ozempic      HPI [name] presents for follow up of diabetes. Patient reports that she has been out of one of her insulin  for about 10 days but she denies acute complaints.   Outpatient Encounter Medications as of 03/01/2024  Medication Sig   atorvastatin  (LIPITOR) 40 MG tablet Take 1 tablet (40 mg total) by mouth daily.   blood glucose meter kit and supplies KIT Dispense based on patient and insurance preference. Use up to four times daily as directed.   celecoxib  (CELEBREX ) 100 MG capsule TAKE 1 CAPSULE BY MOUTH TWICE A DAY   Continuous Glucose Sensor (FREESTYLE LIBRE 3 PLUS SENSOR) MISC CHANGE SENSOR EVERY 15 DAYS.   empagliflozin  (JARDIANCE ) 10 MG TABS tablet TAKE 1 TABLET BY MOUTH DAILY BEFORE BREAKFAST.   enalapril  (VASOTEC ) 2.5 MG tablet TAKE 1 TABLET(2.5 MG) BY MOUTH DAILY   insulin  lispro (HUMALOG  KWIKPEN) 200 UNIT/ML KwikPen Inject 8 Units into the skin at bedtime.   metFORMIN  (GLUCOPHAGE -XR) 500 MG 24 hr tablet TAKE 2 TABLETS (1,000 MG TOTAL) BY MOUTH 2 (TWO) TIMES DAILY WITH A MEAL.   [DISCONTINUED] Insulin  Glargine (BASAGLAR  KWIKPEN) 100 UNIT/ML Inject 22 Units into the skin daily.   insulin  aspart (NOVOLOG  FLEXPEN) 100 UNIT/ML FlexPen Inject 8 Units into the skin daily before supper.   Insulin  Glargine (BASAGLAR  KWIKPEN) 100 UNIT/ML Inject 22 Units into the skin daily.   Insulin  Pen Needle (BD PEN NEEDLE NANO 2ND GEN) 32G X 4 MM MISC as directed   methocarbamol  (ROBAXIN ) 500 MG tablet Take 1 tablet (500 mg total) by mouth every 8 (eight) hours as needed for muscle spasms. (Patient not taking: Reported on 03/01/2024)   oxyCODONE  (ROXICODONE ) 5 MG  immediate release tablet Take 1 tablet (5 mg total) by mouth every 4 (four) hours as needed for severe pain (pain score 7-10). (Patient not taking: Reported on 03/01/2024)   Semaglutide ,0.25 or 0.5MG /DOS, (OZEMPIC , 0.25 OR 0.5 MG/DOSE,) 2 MG/3ML SOPN Inject 0.5 mg into the skin once a week.   [DISCONTINUED] Semaglutide ,0.25 or 0.5MG /DOS, (OZEMPIC , 0.25 OR 0.5 MG/DOSE,) 2 MG/3ML SOPN Inject 0.5 mg into the skin once a week. (Patient not taking: Reported on 03/01/2024)   No facility-administered encounter medications on file as of 03/01/2024.    Past Medical History:  Diagnosis Date   DM type 2 (diabetes mellitus, type 2) (HCC) 07/13/2011   Gestational diabetes    With first pregnancy, glyburide    HTN complicating peripregnancy, antepartum, third trimester 10/11/2016   Obesity (BMI 30-39.9)    Pregnancy induced hypertension    Uterine scar from previous cesarean delivery affecting pregnancy 10/11/2016    Past Surgical History:  Procedure Laterality Date   BICEPT TENODESIS Left 08/04/2023   Procedure: LEFT OPEN BICEPS TENODESIS;  Surgeon: Addie Cordella Hamilton, MD;  Location: [facility] OR;  Service: Orthopedics;  Laterality: Left;  OPEN BICEPS TENODESIS   CESAREAN SECTION N/A 04/10/2013   Procedure: CESAREAN SECTION;  Surgeon: Harland JAYSON Birkenhead, MD;  Location: [facility] ORS;  Service: Obstetrics;  Laterality: N/A;   CESAREAN SECTION N/A 10/11/2016   Procedure: CESAREAN SECTION;  Surgeon: Danielle Rom, MD;  Location: [facility];  Service: Obstetrics;  Laterality: N/A;  Powell POUR, RNFA   PERINEUM REPAIR N/A 04/10/2013   Procedure: EPISIOTOMY REPAIR;  Surgeon: Harland JAYSON Birkenhead, MD;  Location: [facility] ORS;  Service: Obstetrics;  Laterality: N/A;   POSTERIOR LUMBAR FUSION 2 WITH HARDWARE REMOVAL Left 08/04/2023   Procedure: LEFT SHOULDER ARTHROSCOPY WITH EXTENSIVE DEBRIDEMENT;  Surgeon: Addie Cordella Hamilton, MD;  Location: [facility] OR;  Service: Orthopedics;  Laterality: Left;  LEFT SHOULDER MANIPULATION UNDER ANESTHESIA,  EXTENSIVE DEBRIDEMENT   SHOULDER CLOSED REDUCTION Left 08/04/2023   Procedure: LEFT SHOULDER MANIPULATION WITH ANESTHESIA;  Surgeon: Addie Cordella Hamilton, MD;  Location: [facility] OR;  Service: Orthopedics;  Laterality: Left;    Family History  Problem Relation Age of Onset   Cancer Father    Alcohol abuse Neg Hx    Arthritis Neg Hx    Asthma Neg Hx    Birth defects Neg Hx    COPD Neg Hx    Depression Neg Hx    Diabetes Neg Hx    Drug abuse Neg Hx    Early death Neg Hx    Hearing loss Neg Hx    Heart disease Neg Hx    Hyperlipidemia Neg Hx    Hypertension Neg Hx    Kidney disease Neg Hx    Learning disabilities Neg Hx    Mental illness Neg Hx    Mental retardation Neg Hx    Miscarriages / Stillbirths Neg Hx    Stroke Neg Hx    Vision loss Neg Hx    Varicose Veins Neg Hx    Breast cancer Neg Hx     Social History   Socioeconomic History   Marital status: Married    Spouse name: Not on file   Number of children: Not on file   Years of education: Not on file   Highest education level: Not on file  Occupational History   Not on file  Tobacco Use   Smoking status: Never   Smokeless tobacco: Never  Vaping Use   Vaping status: Never Used  Substance and Sexual Activity   Alcohol use: No   Drug use: No   Sexual activity: Yes    Birth control/protection: None  Other Topics Concern   Not on file  Social History Narrative   Not on file   Social Drivers of Health   Financial Resource Strain: Low Risk  (02/23/2023)   Overall Financial Resource Strain (CARDIA)    Difficulty of Paying Living Expenses: Not hard at all  Food Insecurity: No Food Insecurity (02/23/2023)   Hunger Vital Sign    Worried About Running Out of Food in the Last Year: Never true    Ran Out of Food in the Last Year: Never true  Transportation Needs: No Transportation Needs (03/01/2024)   PRAPARE - Administrator, Civil Service (Medical): No    Lack of Transportation (Non-Medical): No   Physical Activity: Sufficiently Active (02/23/2023)   Exercise Vital Sign    Days of Exercise per Week: 5 days    Minutes of Exercise per Session: 30 min  Stress: No Stress Concern Present (02/23/2023)   [employer] of Occupational Health - Occupational Stress Questionnaire    Feeling of Stress : Not at all  Social Connections: Moderately Isolated (02/23/2023)   Social Connection and Isolation Panel    Frequency of Communication with Friends and Family: More than three times a week    Frequency of Social Gatherings with Friends  and Family: More than three times a week    Attends Religious Services: Never    Active Member of Clubs or Organizations: No    Attends Banker Meetings: Never    Marital Status: Married  Catering Manager Violence: Not At Risk (02/23/2023)   Humiliation, Afraid, Rape, and Kick questionnaire    Fear of Current or Ex-Partner: No    Emotionally Abused: No    Physically Abused: No    Sexually Abused: No    Review of Systems  All other systems reviewed and are negative.       Objective    BP 134/82   Pulse 89   Ht 5' 1 (1.549 m)   Wt 145 lb (65.8 kg)   LMP  (LMP Unknown)   SpO2 98%   BMI 27.40 kg/m   Physical Exam Vitals and nursing note reviewed.  Constitutional:      General: She is not in acute distress. Cardiovascular:     Rate and Rhythm: Normal rate and regular rhythm.  Pulmonary:     Effort: Pulmonary effort is normal.     Breath sounds: Normal breath sounds.  Abdominal:     Palpations: Abdomen is soft.     Tenderness: There is no abdominal tenderness.  Neurological:     General: No focal deficit present.     Mental Status: She is alert and oriented to person, place, and time.         Assessment & Plan:   Type 2 diabetes mellitus without complication, with long-term current use of insulin  (HCC) -     POCT glycosylated hemoglobin (Hb A1C)  Encounter for immunization -     Flu vaccine trivalent PF, 6mos  and older(Flulaval,Afluria,Fluarix,Fluzone)  Other orders -     Basaglar  KwikPen; Inject 22 Units into the skin daily.  Dispense: 15 mL; Refill: 1 -     Ozempic  (0.25 or 0.5 MG/DOSE); Inject 0.5 mg into the skin once a week.  Dispense: 3 mL; Refill: 1   Some improved A1c but not quite at goal. Continue   Return in about 3 months (around 06/01/2024) for physical, follow up.   Tanda Raguel SQUIBB, MD

## 2024-03-07 ENCOUNTER — Telehealth: Payer: Self-pay | Admitting: Family Medicine

## 2024-03-07 NOTE — Telephone Encounter (Signed)
 Unable to leave voicemail to confirm appt for 12/1

## 2024-03-12 ENCOUNTER — Ambulatory Visit: Attending: Family Medicine | Admitting: Pharmacist

## 2024-03-12 ENCOUNTER — Other Ambulatory Visit: Payer: Self-pay | Admitting: Family Medicine

## 2024-03-12 ENCOUNTER — Telehealth: Payer: Self-pay | Admitting: Pharmacist

## 2024-03-12 ENCOUNTER — Encounter: Payer: Self-pay | Admitting: Pharmacist

## 2024-03-12 ENCOUNTER — Other Ambulatory Visit: Payer: Self-pay

## 2024-03-12 DIAGNOSIS — Z794 Long term (current) use of insulin: Secondary | ICD-10-CM

## 2024-03-12 DIAGNOSIS — Z7985 Long-term (current) use of injectable non-insulin antidiabetic drugs: Secondary | ICD-10-CM

## 2024-03-12 DIAGNOSIS — Z7984 Long term (current) use of oral hypoglycemic drugs: Secondary | ICD-10-CM

## 2024-03-12 DIAGNOSIS — E119 Type 2 diabetes mellitus without complications: Secondary | ICD-10-CM | POA: Diagnosis not present

## 2024-03-12 MED ORDER — SEMAGLUTIDE (1 MG/DOSE) 4 MG/3ML ~~LOC~~ SOPN: 1.0000 mg | PEN_INJECTOR | SUBCUTANEOUS | 1 refills | Status: DC

## 2024-03-12 NOTE — Progress Notes (Signed)
 S:     No chief complaint on file.  48 y.o. female who presents for diabetes evaluation, education, and management. Patient arrives in  good spirits and presents without any assistance.   Patient was referred and last seen by Primary Care Provider, Dr. Tanda, on 03/01/24.  I last saw her on 01/12/2024.   PMH is significant for T2DM with microalbuminuria, HTN, HLD. Of note, she has no known or documented hx of clinical ASCVD, CHF, or CKD. No hx of pancreatitis or thyroid cancer. DM is longstanding and she was previously managed by [facility] before transferring care to [facility] with Dr. Tanda.   I saw her last on 01/12/2024. We changed Ozempic  to Victoza  due to insurance preference. WE also changed Tresiba  back to Basaglar  per insurance.  At her follow-up with Dr. Tanda on 03/01/24, A1c showed some improvement at 8%, down from 8.8% prior.   Today, she endorses adherence to Basaglar , Humalog , Jardiance , and metformin . She tells me today she was able to get the Ozempic  approved through her insurance. She has been taking 0.25 mg weekly now for 2 weeks. Denies any NV, abdominal pain. Denies any changes in vision.   Family/Social History:  -Fhx: no pertinent positives -Never smoker -No alcohol intake reported   Current diabetes medications include: Basaglar  22u in the morning, Humalog  8u before she eats supper, Jardiance  10 mg daily, metformin  1000 mg XR daily (takes two 500 mg tablets BID), Ozempic  0.25 mg weekly (has taken 2 injections)  Patient reports adherence to taking all medications as prescribed.  Insurance coverage: Aetna  Patient denies hypoglycemic events.  Reported home fasting blood sugars: none Reported 2 hour post-meal/random blood sugars: none  Patient denies nocturia (nighttime urination).  Patient denies neuropathy (nerve pain). Patient denies visual changes. Patient reports self foot exams.   Patient reported dietary habits:  -Admits to dietary indiscretion  and names rice and bread as starchy foods she eats daily  Patient-reported exercise habits:  -Walks 30 minutes daily   O:  Lab Results  Component Value Date   HGBA1C 8.0 (A) 03/01/2024   There were no vitals filed for this visit.  Lipid Panel     Component Value Date/Time   CHOL 103 06/30/2022 1212   TRIG 94 06/30/2022 1212   HDL 49 06/30/2022 1212   CHOLHDL 2.1 06/30/2022 1212   CHOLHDL 4.7 07/11/2015 0906   VLDL 44 (H) 07/11/2015 0906   LDLCALC 36 06/30/2022 1212    Clinical Atherosclerotic Cardiovascular Disease (ASCVD): No  The ASCVD Risk score (Arnett DK, et al., 2019) failed to calculate for the following reasons:   The valid total cholesterol range is 130 to 320 mg/dL   Patient is participating in a Managed Medicaid Plan: No   A/P: Diabetes longstanding currently uncontrolled but improved some. Patient is able to verbalize appropriate hypoglycemia management plan. She is asymptomatic from a hypo- or hyperglycemia standpoint. Medication adherence appears to be okay. We will continue to titrate Ozempic . Since she has tolerated the higher daily dose of Victoza  in the past, I recommend to increase Ozempic  to 0.5 mg weekly for the next 2 weeks. She can then increase to 1 mg weekly thereafter.  -Continued Basaglar  22 units daily.  -Continued Humalog  8 units before dinner.  -Continued Jardiance  10 mg daily.  -INCREASE Ozempic  to 0.5 mg weekly x2 weeks. Then, increase to 1 mg weekly thereafter.  -Extensively discussed pathophysiology of diabetes, recommended lifestyle interventions, dietary effects on blood sugar control.  -  Counseled on s/sx of and management of hypoglycemia.  -Next A1c anticipated 05/2024.   Written patient instructions provided. Patient verbalized understanding of treatment plan.  Total time in face to face counseling 30 minutes.    Follow-up:  Pharmacist 04/18/23  Herlene Fleeta Morris, PharmD, BCACP, CPP Clinical Pharmacist [facility] [phone]

## 2024-03-12 NOTE — Telephone Encounter (Signed)
 Patient tells me she was able to get Ozempic  and has been on 0.25 mg weekly now for 2 weeks. I had her increase to 0.5 mg dose and she will continue this for the next two weeks.   I sent in 1 mg dose for her to pick-up and begin in 2 weeks. Does this need a PA?

## 2024-04-10 ENCOUNTER — Other Ambulatory Visit: Payer: Self-pay | Admitting: Family Medicine

## 2024-04-17 ENCOUNTER — Other Ambulatory Visit: Payer: Self-pay

## 2024-04-17 ENCOUNTER — Ambulatory Visit: Attending: Family Medicine | Admitting: Pharmacist

## 2024-04-17 ENCOUNTER — Encounter: Payer: Self-pay | Admitting: Pharmacist

## 2024-04-17 DIAGNOSIS — Z7984 Long term (current) use of oral hypoglycemic drugs: Secondary | ICD-10-CM | POA: Diagnosis not present

## 2024-04-17 DIAGNOSIS — Z794 Long term (current) use of insulin: Secondary | ICD-10-CM

## 2024-04-17 DIAGNOSIS — E119 Type 2 diabetes mellitus without complications: Secondary | ICD-10-CM

## 2024-04-17 DIAGNOSIS — Z7985 Long-term (current) use of injectable non-insulin antidiabetic drugs: Secondary | ICD-10-CM

## 2024-04-17 MED ORDER — METFORMIN HCL ER 500 MG PO TB24: 1000.0000 mg | ORAL_TABLET | Freq: Two times a day (BID) | ORAL | 0 refills | Status: DC

## 2024-04-17 MED ORDER — BASAGLAR KWIKPEN 100 UNIT/ML ~~LOC~~ SOPN: 22.0000 [IU] | PEN_INJECTOR | Freq: Every day | SUBCUTANEOUS | 1 refills | Status: DC

## 2024-04-17 MED ORDER — FREESTYLE LIBRE 3 PLUS SENSOR MISC: 3 refills | Status: DC

## 2024-04-17 MED ORDER — SEMAGLUTIDE (1 MG/DOSE) 4 MG/3ML ~~LOC~~ SOPN
1.0000 mg | PEN_INJECTOR | SUBCUTANEOUS | 1 refills | Status: DC
  Filled 2024-04-17: qty 3, 28d supply, fill #0

## 2024-04-17 MED ORDER — EMPAGLIFLOZIN 10 MG PO TABS: 10.0000 mg | ORAL_TABLET | Freq: Every day | ORAL | 2 refills | Status: DC

## 2024-04-17 MED ORDER — ENALAPRIL MALEATE 2.5 MG PO TABS: ORAL_TABLET | ORAL | 3 refills | Status: DC

## 2024-04-17 MED ORDER — ATORVASTATIN CALCIUM 40 MG PO TABS: 40.0000 mg | ORAL_TABLET | Freq: Every day | ORAL | 3 refills | Status: DC

## 2024-04-17 NOTE — Progress Notes (Signed)
 "   S:     No chief complaint on file.  49 y.o. female who presents for diabetes evaluation, education, and management. Patient arrives in  good spirits and presents without any assistance.   Patient was referred and last seen by Primary Care Provider, Dr. Tanda, on 03/01/24.  I last saw her on 03/12/2024. At that time, I instructed her to increase Ozempic  to 1 mg weekly. Unfortunately, I sent the Ozempic  to the wrong pharmacy and patient has continued with the 0.5 mg weekly dose.   PMH is significant for T2DM with microalbuminuria, HTN, HLD. Of note, she has no known or documented hx of clinical ASCVD, CHF, or CKD. No hx of pancreatitis or thyroid cancer. DM is longstanding and she was previously managed by [facility] before transferring care to [facility] with Dr. Tanda.   Today, she endorses adherence to Basaglar , Humalog , Jardiance , Ozempic , and metformin . Denies any NV, abdominal pain, or changes in vision related to GLP-1 use.   Family/Social History:  -Fhx: no pertinent positives -Never smoker -No alcohol intake reported   Current diabetes medications include: Basaglar  22u in the morning, Humalog  8u before she eats supper, Jardiance  10 mg daily, metformin  1000 mg BID, Ozempic  0.5 mg weekly Patient reports adherence to taking all medications as prescribed.  Insurance coverage: Aetna  Patient denies hypoglycemic events.  Reported home blood sugars: checks infrequently. Gives range of 140s-160s mg/dL. Does not go lower than this. Denies any hypoglycemia. Denies readings >200 mg/dL.  Patient denies nocturia (nighttime urination).  Patient denies neuropathy (nerve pain). Patient denies visual changes. Patient reports self foot exams.   Patient reported dietary habits:  -Admits to dietary indiscretion and names rice and bread as starchy foods she eats daily -2 meals daily   Patient-reported exercise habits:  -None  O:  Lab Results  Component Value Date   HGBA1C 8.0 (A)  03/01/2024   There were no vitals filed for this visit.  Lipid Panel     Component Value Date/Time   CHOL 103 06/30/2022 1212   TRIG 94 06/30/2022 1212   HDL 49 06/30/2022 1212   CHOLHDL 2.1 06/30/2022 1212   CHOLHDL 4.7 07/11/2015 0906   VLDL 44 (H) 07/11/2015 0906   LDLCALC 36 06/30/2022 1212    Clinical Atherosclerotic Cardiovascular Disease (ASCVD): No  The ASCVD Risk score (Arnett DK, et al., 2019) failed to calculate for the following reasons:   The valid total cholesterol range is 130 to 320 mg/dL   Patient is participating in a Managed Medicaid Plan: No   A/P: Diabetes longstanding currently uncontrolled but seems to be improving with Ozempic . Patient is able to verbalize appropriate hypoglycemia management plan. She is asymptomatic from a hypo- or hyperglycemia standpoint. Medication adherence appears to be okay. We will continue to titrate Ozempic . She recently switched insurances. Will collaborate with our pharmacy and pursue PA approval for the Ozempic  if necessary. -Continued Basaglar  22 units daily.  -Continued Humalog  8 units before dinner.  -Continued Jardiance  10 mg daily.  -Continued metformin  1000 mg BID.  -INCREASE Ozempic  to 1 mg weekly. -Extensively discussed pathophysiology of diabetes, recommended lifestyle interventions, dietary effects on blood sugar control.  -Counseled on s/sx of and management of hypoglycemia.  -Next A1c anticipated 05/2024.   Written patient instructions provided. Patient verbalized understanding of treatment plan.  Total time in face to face counseling 30 minutes.    Follow-up:  Pharmacist 06/11/2024 Tanda - 06/05/2024  Herlene Fleeta Morris, PharmD, BCACP, CPP Clinical Pharmacist  [facility] [phone]   "

## 2024-04-18 ENCOUNTER — Other Ambulatory Visit: Payer: Self-pay | Admitting: Pharmacist

## 2024-04-18 ENCOUNTER — Other Ambulatory Visit: Payer: Self-pay

## 2024-04-18 DIAGNOSIS — Z794 Long term (current) use of insulin: Secondary | ICD-10-CM

## 2024-04-18 MED ORDER — METFORMIN HCL ER 500 MG PO TB24: 1000.0000 mg | ORAL_TABLET | Freq: Two times a day (BID) | ORAL | 0 refills | Status: AC

## 2024-04-18 MED ORDER — NOVOLOG FLEXPEN 100 UNIT/ML ~~LOC~~ SOPN: 8.0000 [IU] | PEN_INJECTOR | Freq: Every day | SUBCUTANEOUS | 1 refills | Status: AC

## 2024-04-18 MED ORDER — ATORVASTATIN CALCIUM 40 MG PO TABS: 40.0000 mg | ORAL_TABLET | Freq: Every day | ORAL | 3 refills | Status: AC

## 2024-04-18 MED ORDER — SEMAGLUTIDE (1 MG/DOSE) 4 MG/3ML ~~LOC~~ SOPN: 1.0000 mg | PEN_INJECTOR | SUBCUTANEOUS | 1 refills | Status: AC

## 2024-04-18 MED ORDER — FREESTYLE LIBRE 3 PLUS SENSOR MISC: 3 refills | Status: AC

## 2024-04-18 MED ORDER — BD PEN NEEDLE NANO 2ND GEN 32G X 4 MM MISC: 3 refills | Status: AC

## 2024-04-18 MED ORDER — BASAGLAR KWIKPEN 100 UNIT/ML ~~LOC~~ SOPN: 22.0000 [IU] | PEN_INJECTOR | Freq: Every day | SUBCUTANEOUS | 1 refills | Status: DC

## 2024-04-18 MED ORDER — ENALAPRIL MALEATE 2.5 MG PO TABS: ORAL_TABLET | ORAL | 3 refills | Status: AC

## 2024-04-18 MED ORDER — EMPAGLIFLOZIN 10 MG PO TABS: 10.0000 mg | ORAL_TABLET | Freq: Every day | ORAL | 2 refills | Status: AC

## 2024-04-20 ENCOUNTER — Other Ambulatory Visit: Payer: Self-pay

## 2024-04-20 ENCOUNTER — Telehealth: Payer: Self-pay

## 2024-04-20 NOTE — Telephone Encounter (Signed)
 Pharmacy Patient Advocate Encounter  Received notification from [facility] that Prior Authorization for OZEMPIC  has been APPROVED from 04/20/2024 to 04/20/2025   PA #/Case ID/Reference #: [id number]

## 2024-04-26 ENCOUNTER — Encounter: Payer: Self-pay | Admitting: *Deleted

## 2024-04-26 ENCOUNTER — Ambulatory Visit: Admission: EM | Admit: 2024-04-26 | Discharge: 2024-04-26 | Disposition: A | Discharge status: Home / Self Care

## 2024-04-26 DIAGNOSIS — G510 Bell's palsy: Secondary | ICD-10-CM | POA: Diagnosis not present

## 2024-04-26 DIAGNOSIS — B029 Zoster without complications: Secondary | ICD-10-CM | POA: Diagnosis not present

## 2024-04-26 MED ORDER — VALACYCLOVIR HCL 1 G PO TABS: 1000.0000 mg | ORAL_TABLET | Freq: Three times a day (TID) | ORAL | 0 refills | Status: AC

## 2024-04-26 MED ORDER — VISINE DRY EYE RELIEF 1 % OP SOLN: OPHTHALMIC | 0 refills | Status: AC

## 2024-04-26 MED ORDER — PREDNISONE 10 MG (48) PO TBPK: ORAL_TABLET | ORAL | 0 refills | Status: AC

## 2024-04-26 NOTE — ED Notes (Signed)
 Pt states she will make a f/u appt with PCP for next week. Verbalizes understanding to go to ED if she develops worsening or new symptoms

## 2024-04-26 NOTE — ED Triage Notes (Addendum)
 Pt reports 2 days of burning pain behind right ear. She tried salon pas patch and tylenol  without relief. Yesterday she says she drinks water and it was leaking out of the right side of her mouth. Right side facial droop noted. Grips strong and equal. States symptoms started after she took new increased dose of ozempic 

## 2024-04-26 NOTE — Discharge Instructions (Signed)
 I believe the pain you are experiencing in your neck is an early sign of herpes zoster (shingles) which is contributing to the drooping of the right side of face (bell's palsy). Bell's palsy is usually self limiting but can become permanent. We are catching symptoms early so there is hope that you can recover fully. Be sure to take meds as directed. Be sure to you use moisturizing eye drops in right eye at night as it does not close all the way at this time and can get dried out.

## 2024-04-26 NOTE — ED Provider Notes (Signed)
 " [facility]    CSN: [id number] Arrival date & time: 04/26/24  0931      History   Chief Complaint Chief Complaint  Patient presents with   Otalgia   Facial Droop    HPI [name] is a 49 y.o. female.   Pt presents today due to 3 days of burning, electric type pain up the right side of neck. Pt states that she has been trying to use topical pain reliever without relief of symptoms. Pt states that the pain feels deep. Pt states that she noticed while drinking water yesterday that she was unable to keep it in her mouth. Pt noticed right sided facial weakness but does not have weakness in right arm or leg. Pt states that these symptoms started after increasing dose of semaglutide .   The history is provided by the patient.  Otalgia   Past Medical History:  Diagnosis Date   DM type 2 (diabetes mellitus, type 2) (HCC) 07/13/2011   Gestational diabetes    With first pregnancy, glyburide    HTN complicating peripregnancy, antepartum, third trimester 10/11/2016   Obesity (BMI 30-39.9)    Pregnancy induced hypertension    Uterine scar from previous cesarean delivery affecting pregnancy 10/11/2016    Patient Active Problem List   Diagnosis Date Noted   Biceps tendonitis on right 08/06/2023   Degenerative superior labral anterior-to-posterior (SLAP) tear of right shoulder 08/06/2023   Synovitis of right shoulder 08/06/2023   Adhesive capsulitis of right shoulder 08/06/2023   Adhesive capsulitis of left shoulder 03/01/2023   Dyspepsia 12/23/2021   Peeling skin 04/07/2021   Healthcare maintenance 12/22/2018   Hyperlipemia 08/26/2014   Microalbuminuria 08/26/2014   HTN (hypertension) 06/21/2013   Type 2 diabetes mellitus with microalbuminuria, without long-term current use of insulin  (HCC) 07/13/2011    Past Surgical History:  Procedure Laterality Date   BICEPT TENODESIS Left 08/04/2023   Procedure: LEFT OPEN BICEPS TENODESIS;  Surgeon: Addie Cordella Hamilton, MD;  Location:  [facility];  Service: Orthopedics;  Laterality: Left;  OPEN BICEPS TENODESIS   CESAREAN SECTION N/A 04/10/2013   Procedure: CESAREAN SECTION;  Surgeon: Harland JAYSON Birkenhead, MD;  Location: [facility];  Service: Obstetrics;  Laterality: N/A;   CESAREAN SECTION N/A 10/11/2016   Procedure: CESAREAN SECTION;  Surgeon: Danielle Rom, MD;  Location: [facility];  Service: Obstetrics;  Laterality: N/A;  Powell POUR, RNFA   PERINEUM REPAIR N/A 04/10/2013   Procedure: EPISIOTOMY REPAIR;  Surgeon: Harland JAYSON Birkenhead, MD;  Location: [facility];  Service: Obstetrics;  Laterality: N/A;   SHOULDER CLOSED REDUCTION Left 08/04/2023   Procedure: LEFT SHOULDER MANIPULATION WITH ANESTHESIA;  Surgeon: Addie Cordella Hamilton, MD;  Location: [facility] OR;  Service: Orthopedics;  Laterality: Left;    OB History     Gravida  3   Para  3   Term  2   Preterm  1   AB  0   Living  2      SAB      IAB      Ectopic      Multiple  0   Live Births  2            Home Medications    Prior to Admission medications  Medication Sig Start Date End Date Taking? Authorizing Provider  atorvastatin  (LIPITOR) 40 MG tablet Take 1 tablet (40 mg total) by mouth daily. 04/18/24  Yes Newlin, Enobong, MD  empagliflozin  (JARDIANCE ) 10 MG TABS tablet Take 1 tablet (10  mg total) by mouth daily. 04/18/24  Yes Newlin, Enobong, MD  enalapril  (VASOTEC ) 2.5 MG tablet TAKE 1 TABLET(2.5 MG) BY MOUTH DAILY 04/18/24  Yes Newlin, Enobong, MD  Insulin  Glargine (BASAGLAR  KWIKPEN) 100 UNIT/ML Inject 22 Units into the skin daily. 04/18/24  Yes Newlin, Enobong, MD  metFORMIN  (GLUCOPHAGE -XR) 500 MG 24 hr tablet Take 2 tablets (1,000 mg total) by mouth 2 (two) times daily with a meal. 04/18/24  Yes Newlin, Enobong, MD  Polyethylene Glycol 400 (VISINE  DRY EYE RELIEF) 1 % SOLN Apply in right eye at night 04/26/24  Yes Andra Corean BROCKS, PA-C  predniSONE  (STERAPRED UNI-PAK 48 TAB) 10 MG (48) TBPK tablet Take 7 tablets (70 mg total) by mouth daily for 7 days, THEN 6  tablets (60 mg total) daily for 1 day, THEN 5 tablets (50 mg total) daily for 1 day, THEN 4 tablets (40 mg total) daily for 1 day, THEN 3 tablets (30 mg total) daily for 1 day, THEN 2 tablets (20 mg total) daily for 1 day, THEN 1 tablet (10 mg total) daily for 1 day. 04/26/24 05/09/24 Yes Andra Corean C, PA-C  Semaglutide , 1 MG/DOSE, 4 MG/3ML SOPN Inject 1 mg as directed once a week. 04/18/24  Yes Newlin, Enobong, MD  valACYclovir  (VALTREX ) 1000 MG tablet Take 1 tablet (1,000 mg total) by mouth 3 (three) times daily for 7 days. 04/26/24 05/03/24 Yes Andra Corean C, PA-C  blood glucose meter kit and supplies KIT Dispense based on patient and insurance preference. Use up to four times daily as directed. 04/07/21   Espinoza, Alejandra, DO  celecoxib  (CELEBREX ) 100 MG capsule TAKE 1 CAPSULE BY MOUTH TWICE A DAY Patient not taking: Reported on 04/26/2024 09/01/23   Magnant, Carlin CROME, PA-C  Continuous Glucose Sensor (FREESTYLE LIBRE 3 PLUS SENSOR) MISC Change sensor every 15 days. 04/18/24   Newlin, Enobong, MD  insulin  aspart (NOVOLOG  FLEXPEN) 100 UNIT/ML FlexPen Inject 8 Units into the skin daily before supper. Patient not taking: Reported on 04/26/2024 04/18/24   Newlin, Enobong, MD  Insulin  Pen Needle (BD PEN NEEDLE NANO 2ND GEN) 32G X 4 MM MISC as directed 04/18/24   Newlin, Enobong, MD  liraglutide  (VICTOZA ) 18 MG/3ML SOPN Inject 1.8 mg into the skin daily. Patient not taking: Reported on 04/26/2024 02/08/24   [provider]  methocarbamol  (ROBAXIN ) 500 MG tablet Take 1 tablet (500 mg total) by mouth every 8 (eight) hours as needed for muscle spasms. Patient not taking: Reported on 03/01/2024 08/04/23   Magnant, Carlin CROME, PA-C  oxyCODONE  (ROXICODONE ) 5 MG immediate release tablet Take 1 tablet (5 mg total) by mouth every 4 (four) hours as needed for severe pain (pain score 7-10). Patient not taking: Reported on 03/01/2024 08/04/23   Magnant, Carlin CROME, PA-C  OZEMPIC , 0.25 OR 0.5 MG/DOSE, 2  MG/3ML SOPN Inject 0.5 mg into the skin once a week. Patient not taking: Reported on 04/26/2024 03/27/24   [provider]    Family History Family History  Problem Relation Age of Onset   Cancer Father    Alcohol abuse Neg Hx    Arthritis Neg Hx    Asthma Neg Hx    Birth defects Neg Hx    COPD Neg Hx    Depression Neg Hx    Diabetes Neg Hx    Drug abuse Neg Hx    Early death Neg Hx    Hearing loss Neg Hx    Heart disease Neg Hx    Hyperlipidemia Neg Hx  Hypertension Neg Hx    Kidney disease Neg Hx    Learning disabilities Neg Hx    Mental illness Neg Hx    Mental retardation Neg Hx    Miscarriages / Stillbirths Neg Hx    Stroke Neg Hx    Vision loss Neg Hx    Varicose Veins Neg Hx    Breast cancer Neg Hx     Social History Social History[1]   Allergies   Patient has no known allergies.   Review of Systems Review of Systems  HENT:  Positive for ear pain.      Physical Exam Triage Vital Signs ED Triage Vitals  Encounter Vitals Group     BP 04/26/24 0945 137/83     Girls Systolic BP Percentile --      Girls Diastolic BP Percentile --      Boys Systolic BP Percentile --      Boys Diastolic BP Percentile --      Pulse Rate 04/26/24 0945 (!) 112     Resp 04/26/24 0945 18     Temp 04/26/24 0945 97.9 F (36.6 C)     Temp Source 04/26/24 0945 Oral     SpO2 04/26/24 0945 97 %     Weight --      Height --      Head Circumference --      Peak Flow --      Pain Score 04/26/24 0951 8     Pain Loc --      Pain Education --      Exclude from Growth Chart --    No data found.  Updated Vital Signs BP 137/83 (BP Location: Left Arm)   Pulse (!) 112   Temp 97.9 F (36.6 C) (Oral)   Resp 18   LMP 04/22/2024   SpO2 97%   Visual Acuity Right Eye Distance:   Left Eye Distance:   Bilateral Distance:    Right Eye Near:   Left Eye Near:    Bilateral Near:     Physical Exam Vitals and nursing note reviewed.  Constitutional:      General: She  is not in acute distress.    Appearance: Normal appearance. She is not ill-appearing, toxic-appearing or diaphoretic.  Eyes:     General: No scleral icterus.    Extraocular Movements: Extraocular movements intact.     Pupils: Pupils are equal, round, and reactive to light.  Neck:      Comments: No tenderness to palpation but pain experienced in the area noted of diagram, mild erythema of skin noted in this area Cardiovascular:     Rate and Rhythm: Normal rate and regular rhythm.     Heart sounds: Normal heart sounds.  Pulmonary:     Effort: Pulmonary effort is normal. No respiratory distress.     Breath sounds: Normal breath sounds. No wheezing or rhonchi.  Skin:    General: Skin is warm.  Neurological:     Mental Status: She is alert and oriented to person, place, and time.  Psychiatric:        Mood and Affect: Mood normal.        Behavior: Behavior normal.      UC Treatments / Results  Labs (all labs ordered are listed, but only abnormal results are displayed) Labs Reviewed - No data to display  EKG   Radiology No results found.  Procedures Procedures (including critical care time)  Medications Ordered in UC Medications - No data  to display  Initial Impression / Assessment and Plan / UC Course  I have reviewed the triage vital signs and the nursing notes.  Pertinent labs & imaging results that were available during my care of the patient were reviewed by me and considered in my medical decision making (see chart for details).     Final Clinical Impressions(s) / UC Diagnoses   Final diagnoses:  Bell's palsy  Herpes zoster without complication     Discharge Instructions      I believe the pain you are experiencing in your neck is an early sign of herpes zoster (shingles) which is contributing to the drooping of the right side of face (bell's palsy). Bell's palsy is usually self limiting but can become permanent. We are catching symptoms early so there is  hope that you can recover fully. Be sure to take meds as directed. Be sure to you use moisturizing eye drops in right eye at night as it does not close all the way at this time and can get dried out.     ED Prescriptions     Medication Sig Dispense Auth. Provider   valACYclovir  (VALTREX ) 1000 MG tablet Take 1 tablet (1,000 mg total) by mouth 3 (three) times daily for 7 days. 21 tablet Andra Krabbe C, PA-C   predniSONE  (STERAPRED UNI-PAK 48 TAB) 10 MG (48) TBPK tablet Take 7 tablets (70 mg total) by mouth daily for 7 days, THEN 6 tablets (60 mg total) daily for 1 day, THEN 5 tablets (50 mg total) daily for 1 day, THEN 4 tablets (40 mg total) daily for 1 day, THEN 3 tablets (30 mg total) daily for 1 day, THEN 2 tablets (20 mg total) daily for 1 day, THEN 1 tablet (10 mg total) daily for 1 day. 70 tablet Andra Krabbe C, PA-C   Polyethylene Glycol 400 (VISINE  DRY EYE RELIEF) 1 % SOLN Apply in right eye at night 15 mL Andra Krabbe BROCKS, PA-C      PDMP not reviewed this encounter.    [1]  Social History Tobacco Use   Smoking status: Never   Smokeless tobacco: Never  Vaping Use   Vaping status: Never Used  Substance Use Topics   Alcohol use: No   Drug use: No     Andra Krabbe BROCKS, PA-C 04/26/24 1024  "

## 2024-05-02 ENCOUNTER — Other Ambulatory Visit: Payer: Self-pay | Admitting: Family Medicine

## 2024-05-07 ENCOUNTER — Ambulatory Visit: Payer: Self-pay | Admitting: Family Medicine

## 2024-05-09 ENCOUNTER — Ambulatory Visit: Admitting: Family Medicine

## 2024-05-09 ENCOUNTER — Encounter: Payer: Self-pay | Admitting: Family Medicine

## 2024-05-09 VITALS — BP 148/89 | HR 99 | Ht 61.0 in | Wt 141.8 lb

## 2024-05-09 DIAGNOSIS — G510 Bell's palsy: Secondary | ICD-10-CM | POA: Diagnosis not present

## 2024-05-09 NOTE — Progress Notes (Signed)
 "  Established Patient Office Visit  Subjective    Patient ID: [name], female    DOB: 02-23-1976  Age: 49 y.o. MRN: [id number]  CC:  Chief Complaint  Patient presents with   Numbness    Pt reports she might have shingles or a stroke because her face is numb. She thinks it is a stroke but she was told at urgent care it was shingles.    HPI [name] presents with drooping of right side of face. She was seen in UC about 2 weeks ago and dx with shingles. Patient denies any further neuro symptoms. She never developed any skin lesions. She reports that she has not had further facial symptoms but has not had significant improvement. She denies any viral sx.   Outpatient Encounter Medications as of 05/09/2024  Medication Sig   atorvastatin  (LIPITOR) 40 MG tablet Take 1 tablet (40 mg total) by mouth daily.   empagliflozin  (JARDIANCE ) 10 MG TABS tablet Take 1 tablet (10 mg total) by mouth daily.   enalapril  (VASOTEC ) 2.5 MG tablet TAKE 1 TABLET(2.5 MG) BY MOUTH DAILY   Insulin  Glargine (BASAGLAR  KWIKPEN) 100 UNIT/ML INJECT 22 UNITS INTO THE SKIN DAILY.   Insulin  Pen Needle (BD PEN NEEDLE NANO 2ND GEN) 32G X 4 MM MISC as directed   metFORMIN  (GLUCOPHAGE -XR) 500 MG 24 hr tablet Take 2 tablets (1,000 mg total) by mouth 2 (two) times daily with a meal.   predniSONE  (STERAPRED UNI-PAK 48 TAB) 10 MG (48) TBPK tablet Take 7 tablets (70 mg total) by mouth daily for 7 days, THEN 6 tablets (60 mg total) daily for 1 day, THEN 5 tablets (50 mg total) daily for 1 day, THEN 4 tablets (40 mg total) daily for 1 day, THEN 3 tablets (30 mg total) daily for 1 day, THEN 2 tablets (20 mg total) daily for 1 day, THEN 1 tablet (10 mg total) daily for 1 day.   blood glucose meter kit and supplies KIT Dispense based on patient and insurance preference. Use up to four times daily as directed.   celecoxib  (CELEBREX ) 100 MG capsule TAKE 1 CAPSULE BY MOUTH TWICE A DAY (Patient not taking: Reported on 04/26/2024)   Continuous  Glucose Sensor (FREESTYLE LIBRE 3 PLUS SENSOR) MISC Change sensor every 15 days.   insulin  aspart (NOVOLOG  FLEXPEN) 100 UNIT/ML FlexPen Inject 8 Units into the skin daily before supper. (Patient not taking: Reported on 04/26/2024)   liraglutide  (VICTOZA ) 18 MG/3ML SOPN Inject 1.8 mg into the skin daily. (Patient not taking: Reported on 04/26/2024)   methocarbamol  (ROBAXIN ) 500 MG tablet Take 1 tablet (500 mg total) by mouth every 8 (eight) hours as needed for muscle spasms. (Patient not taking: Reported on 03/01/2024)   oxyCODONE  (ROXICODONE ) 5 MG immediate release tablet Take 1 tablet (5 mg total) by mouth every 4 (four) hours as needed for severe pain (pain score 7-10). (Patient not taking: Reported on 03/01/2024)   OZEMPIC , 0.25 OR 0.5 MG/DOSE, 2 MG/3ML SOPN Inject 0.5 mg into the skin once a week. (Patient not taking: Reported on 04/26/2024)   Polyethylene Glycol 400 (VISINE  DRY EYE RELIEF) 1 % SOLN Apply in right eye at night   Semaglutide , 1 MG/DOSE, 4 MG/3ML SOPN Inject 1 mg as directed once a week. (Patient not taking: Reported on 05/09/2024)   No facility-administered encounter medications on file as of 05/09/2024.    Past Medical History:  Diagnosis Date   DM type 2 (diabetes mellitus, type 2) (HCC) 07/13/2011  Gestational diabetes    With first pregnancy, glyburide    HTN complicating peripregnancy, antepartum, third trimester 10/11/2016   Obesity (BMI 30-39.9)    Pregnancy induced hypertension    Uterine scar from previous cesarean delivery affecting pregnancy 10/11/2016    Past Surgical History:  Procedure Laterality Date   BICEPT TENODESIS Left 08/04/2023   Procedure: LEFT OPEN BICEPS TENODESIS;  Surgeon: Addie Cordella Hamilton, MD;  Location: [facility] OR;  Service: Orthopedics;  Laterality: Left;  OPEN BICEPS TENODESIS   CESAREAN SECTION N/A 04/10/2013   Procedure: CESAREAN SECTION;  Surgeon: Harland JAYSON Birkenhead, MD;  Location: [facility];  Service: Obstetrics;  Laterality: N/A;   CESAREAN SECTION N/A  10/11/2016   Procedure: CESAREAN SECTION;  Surgeon: Danielle Rom, MD;  Location: [facility];  Service: Obstetrics;  Laterality: N/A;  Powell POUR, RNFA   PERINEUM REPAIR N/A 04/10/2013   Procedure: EPISIOTOMY REPAIR;  Surgeon: Harland JAYSON Birkenhead, MD;  Location: [facility];  Service: Obstetrics;  Laterality: N/A;   SHOULDER CLOSED REDUCTION Left 08/04/2023   Procedure: LEFT SHOULDER MANIPULATION WITH ANESTHESIA;  Surgeon: Addie Cordella Hamilton, MD;  Location: [facility] OR;  Service: Orthopedics;  Laterality: Left;    Family History  Problem Relation Age of Onset   Cancer Father    Alcohol abuse Neg Hx    Arthritis Neg Hx    Asthma Neg Hx    Birth defects Neg Hx    COPD Neg Hx    Depression Neg Hx    Diabetes Neg Hx    Drug abuse Neg Hx    Early death Neg Hx    Hearing loss Neg Hx    Heart disease Neg Hx    Hyperlipidemia Neg Hx    Hypertension Neg Hx    Kidney disease Neg Hx    Learning disabilities Neg Hx    Mental illness Neg Hx    Mental retardation Neg Hx    Miscarriages / Stillbirths Neg Hx    Stroke Neg Hx    Vision loss Neg Hx    Varicose Veins Neg Hx    Breast cancer Neg Hx     Social History   Socioeconomic History   Marital status: Married    Spouse name: Not on file   Number of children: Not on file   Years of education: Not on file   Highest education level: Not on file  Occupational History   Not on file  Tobacco Use   Smoking status: Never   Smokeless tobacco: Never  Vaping Use   Vaping status: Never Used  Substance and Sexual Activity   Alcohol use: No   Drug use: No   Sexual activity: Yes    Birth control/protection: None  Other Topics Concern   Not on file  Social History Narrative   Not on file   Social Drivers of Health   Tobacco Use: Low Risk (05/09/2024)   Patient History    Smoking Tobacco Use: Never    Smokeless Tobacco Use: Never    Passive Exposure: Not on file  Financial Resource Strain: Low Risk (02/23/2023)   Overall Financial  Resource Strain (CARDIA)    Difficulty of Paying Living Expenses: Not hard at all  Food Insecurity: No Food Insecurity (02/23/2023)   Hunger Vital Sign    Worried About Running Out of Food in the Last Year: Never true    Ran Out of Food in the Last Year: Never true  Transportation Needs: No Transportation Needs (03/01/2024)   Epic  Lack of Transportation (Medical): No    Lack of Transportation (Non-Medical): No  Physical Activity: Sufficiently Active (02/23/2023)   Exercise Vital Sign    Days of Exercise per Week: 5 days    Minutes of Exercise per Session: 30 min  Stress: No Stress Concern Present (02/23/2023)   [employer] of Occupational Health - Occupational Stress Questionnaire    Feeling of Stress : Not at all  Social Connections: Moderately Isolated (02/23/2023)   Social Connection and Isolation Panel    Frequency of Communication with Friends and Family: More than three times a week    Frequency of Social Gatherings with Friends and Family: More than three times a week    Attends Religious Services: Never    Database Administrator or Organizations: No    Attends Banker Meetings: Never    Marital Status: Married  Catering Manager Violence: Not At Risk (02/23/2023)   Humiliation, Afraid, Rape, and Kick questionnaire    Fear of Current or Ex-Partner: No    Emotionally Abused: No    Physically Abused: No    Sexually Abused: No  Depression (PHQ2-9): Low Risk (08/24/2023)   Depression (PHQ2-9)    PHQ-2 Score: 0  Alcohol Screen: Low Risk (02/23/2023)   Alcohol Screen    Last Alcohol Screening Score (AUDIT): 0  Housing: Low Risk (02/23/2023)   Housing    Last Housing Risk Score: 0  Utilities: Not At Risk (02/23/2023)   AHC Utilities    Threatened with loss of utilities: No  Health Literacy: Adequate Health Literacy (02/23/2023)   B1300 Health Literacy    Frequency of need for help with medical instructions: Never    Review of Systems  All other  systems reviewed and are negative.       Objective    BP (!) 148/89   Pulse 99   Ht 5' 1 (1.549 m)   Wt 141 lb 12.8 oz (64.3 kg)   LMP 04/22/2024   SpO2 99%   BMI 26.79 kg/m   Physical Exam Vitals and nursing note reviewed.  Constitutional:      General: She is not in acute distress. HENT:     Head:     Comments: Right sided facial drooping.  Eyes:     Comments: Unable to completely close right eyelids.   Cardiovascular:     Rate and Rhythm: Normal rate and regular rhythm.  Pulmonary:     Effort: Pulmonary effort is normal.     Breath sounds: Normal breath sounds.  Neurological:     General: No focal deficit present.     Mental Status: She is alert and oriented to person, place, and time.         Assessment & Plan:   Bell's palsy -     Ambulatory referral to Neurology   Patient is desiring referral as she believes she should have had more improvement of her facial drooping over the past 2 weeks.   Return if symptoms worsen or fail to improve.   Tanda Raguel SQUIBB, MD  "

## 2024-06-05 ENCOUNTER — Encounter: Admitting: Family Medicine

## 2024-06-11 ENCOUNTER — Ambulatory Visit: Payer: Self-pay | Admitting: Pharmacist
